# Patient Record
Sex: Female | Born: 1985 | Race: White | Hispanic: No | Marital: Married | Smoking: Never smoker
Health system: Southern US, Community
[De-identification: ages and names within clinical notes are randomized; demographics above are authoritative.]

## PROBLEM LIST (undated history)

## (undated) DIAGNOSIS — F429 Obsessive-compulsive disorder, unspecified: Secondary | ICD-10-CM

## (undated) DIAGNOSIS — F319 Bipolar disorder, unspecified: Secondary | ICD-10-CM

## (undated) DIAGNOSIS — S0300XA Dislocation of jaw, unspecified side, initial encounter: Secondary | ICD-10-CM

## (undated) DIAGNOSIS — G43909 Migraine, unspecified, not intractable, without status migrainosus: Secondary | ICD-10-CM

## (undated) DIAGNOSIS — F32A Depression, unspecified: Secondary | ICD-10-CM

## (undated) DIAGNOSIS — F329 Major depressive disorder, single episode, unspecified: Secondary | ICD-10-CM

## (undated) HISTORY — DX: Bipolar disorder, unspecified: F31.9

## (undated) HISTORY — DX: Obsessive-compulsive disorder, unspecified: F42.9

## (undated) HISTORY — DX: Dislocation of jaw, unspecified side, initial encounter: S03.00XA

## (undated) HISTORY — PX: OTHER SURGICAL HISTORY: SHX169

---

## 2007-03-21 ENCOUNTER — Emergency Department: Payer: Self-pay | Admitting: Emergency Medicine

## 2007-12-12 ENCOUNTER — Emergency Department: Payer: Self-pay | Admitting: Internal Medicine

## 2013-04-03 ENCOUNTER — Emergency Department: Payer: Self-pay | Admitting: Emergency Medicine

## 2013-04-03 LAB — URINALYSIS, COMPLETE
Bilirubin,UR: NEGATIVE
Glucose,UR: NEGATIVE mg/dL (ref 0–75)
Nitrite: NEGATIVE
Ph: 5 (ref 4.5–8.0)
Protein: NEGATIVE
RBC,UR: 2 /HPF (ref 0–5)
Squamous Epithelial: 1
WBC UR: 4 /HPF (ref 0–5)

## 2013-04-03 LAB — COMPREHENSIVE METABOLIC PANEL
Alkaline Phosphatase: 75 U/L (ref 50–136)
BUN: 9 mg/dL (ref 7–18)
Bilirubin,Total: 0.4 mg/dL (ref 0.2–1.0)
EGFR (African American): >60
EGFR (Non-African Amer.): >60
Osmolality: 272 (ref 275–301)
Potassium: 3.8 mmol/L (ref 3.5–5.1)
SGOT(AST): 22 U/L (ref 15–37)
SGPT (ALT): 17 U/L (ref 12–78)
Sodium: 137 mmol/L (ref 136–145)
Total Protein: 8.1 g/dL (ref 6.4–8.2)

## 2013-04-03 LAB — WET PREP, GENITAL

## 2013-04-03 LAB — PREGNANCY, URINE: Pregnancy Test, Urine: NEGATIVE m[IU]/mL

## 2013-04-03 LAB — CBC
MCH: 27.9 pg (ref 26.0–34.0)
MCHC: 34.4 g/dL (ref 32.0–36.0)
MCV: 81 fL (ref 80–100)
RBC: 4.93 10*6/uL (ref 3.80–5.20)

## 2013-04-03 LAB — GC/CHLAMYDIA PROBE AMP

## 2013-08-06 ENCOUNTER — Emergency Department: Payer: Self-pay | Admitting: Emergency Medicine

## 2013-08-06 LAB — URINALYSIS, COMPLETE
Bilirubin,UR: NEGATIVE
Blood: NEGATIVE
Glucose,UR: NEGATIVE mg/dL (ref 0–75)
Leukocyte Esterase: NEGATIVE
Nitrite: NEGATIVE
RBC,UR: 9 /HPF (ref 0–5)
Squamous Epithelial: 1
WBC UR: 1 /HPF (ref 0–5)

## 2013-08-06 LAB — TROPONIN I: Troponin-I: <0.02 ng/mL

## 2013-08-06 LAB — BASIC METABOLIC PANEL
Anion Gap: 6 — ABNORMAL LOW (ref 7–16)
BUN: 7 mg/dL (ref 7–18)
Calcium, Total: 8.5 mg/dL (ref 8.5–10.1)
Co2: 25 mmol/L (ref 21–32)
Creatinine: 0.72 mg/dL (ref 0.60–1.30)
Osmolality: 275 (ref 275–301)
Sodium: 139 mmol/L (ref 136–145)

## 2013-08-06 LAB — CBC
HCT: 37.6 % (ref 35.0–47.0)
MCH: 27.4 pg (ref 26.0–34.0)
MCHC: 33.7 g/dL (ref 32.0–36.0)
MCV: 81 fL (ref 80–100)
Platelet: 401 10*3/uL (ref 150–440)
RBC: 4.63 10*6/uL (ref 3.80–5.20)
WBC: 8.9 10*3/uL (ref 3.6–11.0)

## 2013-08-09 ENCOUNTER — Emergency Department (HOSPITAL_COMMUNITY): Payer: Self-pay

## 2013-08-09 ENCOUNTER — Emergency Department (HOSPITAL_COMMUNITY)
Admission: EM | Admit: 2013-08-09 | Discharge: 2013-08-09 | Disposition: A | Discharge status: Home / Self Care | Payer: Self-pay | Attending: Emergency Medicine | Admitting: Emergency Medicine

## 2013-08-09 ENCOUNTER — Encounter (HOSPITAL_COMMUNITY): Payer: Self-pay | Admitting: Emergency Medicine

## 2013-08-09 DIAGNOSIS — Z79899 Other long term (current) drug therapy: Secondary | ICD-10-CM | POA: Insufficient documentation

## 2013-08-09 DIAGNOSIS — F329 Major depressive disorder, single episode, unspecified: Secondary | ICD-10-CM | POA: Insufficient documentation

## 2013-08-09 DIAGNOSIS — F3289 Other specified depressive episodes: Secondary | ICD-10-CM | POA: Insufficient documentation

## 2013-08-09 DIAGNOSIS — R110 Nausea: Secondary | ICD-10-CM | POA: Insufficient documentation

## 2013-08-09 DIAGNOSIS — Z8679 Personal history of other diseases of the circulatory system: Secondary | ICD-10-CM | POA: Insufficient documentation

## 2013-08-09 DIAGNOSIS — R42 Dizziness and giddiness: Secondary | ICD-10-CM | POA: Insufficient documentation

## 2013-08-09 DIAGNOSIS — R55 Syncope and collapse: Secondary | ICD-10-CM | POA: Insufficient documentation

## 2013-08-09 DIAGNOSIS — Z880 Allergy status to penicillin: Secondary | ICD-10-CM | POA: Insufficient documentation

## 2013-08-09 DIAGNOSIS — R51 Headache: Secondary | ICD-10-CM | POA: Insufficient documentation

## 2013-08-09 HISTORY — DX: Migraine, unspecified, not intractable, without status migrainosus: G43.909

## 2013-08-09 HISTORY — DX: Major depressive disorder, single episode, unspecified: F32.9

## 2013-08-09 HISTORY — DX: Depression, unspecified: F32.A

## 2013-08-09 LAB — CBC WITH DIFFERENTIAL/PLATELET
Eosinophils Absolute: 0.1 10*3/uL (ref 0.0–0.7)
Eosinophils Relative: 1 % (ref 0–5)
HCT: 40.9 % (ref 36.0–46.0)
Hemoglobin: 14 g/dL (ref 12.0–15.0)
Lymphocytes Relative: 27 % (ref 12–46)
Lymphs Abs: 2.7 10*3/uL (ref 0.7–4.0)
MCH: 28.4 pg (ref 26.0–34.0)
MCV: 83 fL (ref 78.0–100.0)
Monocytes Absolute: 0.5 10*3/uL (ref 0.1–1.0)
Monocytes Relative: 5 % (ref 3–12)
Neutro Abs: 6.7 10*3/uL (ref 1.7–7.7)
Neutrophils Relative %: 67 % (ref 43–77)
RBC: 4.93 MIL/uL (ref 3.87–5.11)
RDW: 13 % (ref 11.5–15.5)

## 2013-08-09 LAB — BASIC METABOLIC PANEL
BUN: 7 mg/dL (ref 6–23)
CO2: 22 mEq/L (ref 19–32)
Calcium: 9.2 mg/dL (ref 8.4–10.5)
GFR calc non Af Amer: >90 mL/min (ref >90)
Glucose, Bld: 85 mg/dL (ref 70–99)
Potassium: 4.2 mEq/L (ref 3.5–5.1)

## 2013-08-09 MED ORDER — SODIUM CHLORIDE 0.9 % IV BOLUS (SEPSIS)
1000.0000 mL | Freq: Once | INTRAVENOUS | Status: AC
  Administered 2013-08-09: 1000 mL via INTRAVENOUS

## 2013-08-09 MED ORDER — DIPHENHYDRAMINE HCL 50 MG/ML IJ SOLN
25.0000 mg | Freq: Once | INTRAMUSCULAR | Status: AC
  Administered 2013-08-09: 25 mg via INTRAVENOUS
  Filled 2013-08-09: qty 1

## 2013-08-09 MED ORDER — METOCLOPRAMIDE HCL 5 MG/ML IJ SOLN
10.0000 mg | Freq: Once | INTRAMUSCULAR | Status: AC
  Administered 2013-08-09: 10 mg via INTRAVENOUS
  Filled 2013-08-09: qty 2

## 2013-08-09 NOTE — ED Notes (Signed)
Pt states she's currently feeling really dizzy, and when sometimes she feels like she has stuttering, vision changes "I just cant see right". "its hard for me to talk sometimes". "its hard for me to get my words out".

## 2013-08-09 NOTE — ED Notes (Signed)
Pt refusing POCT pregnancy test

## 2013-08-09 NOTE — ED Notes (Signed)
Patient transported to X-ray 

## 2013-08-09 NOTE — ED Notes (Signed)
Pt has dermal piercing in right wrist, spoke with MRI on the phone about possible contraindication, MRI will test piercing with magnet before procedure.

## 2013-08-09 NOTE — ED Provider Notes (Signed)
CSN: [id number]     Arrival date & time 08/09/13  0756 History   First MD Initiated Contact with Patient 08/09/13 0805     Chief Complaint  Patient presents with  . Near Syncope   (Consider location/radiation/quality/duration/timing/severity/associated sxs/prior Treatment) HPI Comments: 27 year old female presents with near-syncope. She states this happened multiple times over the past 3 days. She will get lightheaded but can last up to an hour. The symptoms usually get better with rest. I'll and are exacerbated by sitting or standing up. She's passed out once or twice a muscle time since a not passing out. She does get nauseous and feels like she is seeing spots. She's also been intermittently getting worsening headaches during this time. States had a headache on the left side posteriorly going to her front has been on for about 2 weeks. It waxes and wanes in intensity but has been there constantly. Headaches are not correlating with the dizzy spells. She's not having trouble walking. She threw up once after the most recent episode of lightheadedness. Denies any chest pain or shortness of breath. Has never had a blood clots. Was seen at [place] a couple days ago and states he ran some blood tests and then discharged her. She denies any other vomiting, diarrhea, urinary symptoms, or abdominal pain. She has self diagnosed herself with migraines and states she currently has a headache like that is about a 5/10 intensity.   Past Medical History  Diagnosis Date  . Migraine   . Depression    History reviewed. No pertinent past surgical history. No family history on file. History  Substance Use Topics  . Smoking status: Never Smoker   . Smokeless tobacco: Not on file  . Alcohol Use: Yes   OB History   Grav Para Term Preterm Abortions TAB SAB Ect Mult Living                 Review of Systems  Constitutional: Negative for fever and chills.  Eyes: Negative for visual disturbance.   Respiratory: Negative for shortness of breath.   Cardiovascular: Negative for chest pain and leg swelling.  Gastrointestinal: Negative for nausea, abdominal pain and diarrhea.  Genitourinary: Negative for dysuria and vaginal bleeding.  Neurological: Positive for light-headedness and headaches. Negative for weakness and numbness.  All other systems reviewed and are negative.    Allergies  Amoxicillin and Penicillins  Home Medications   Current Outpatient Rx  Name  Route  Sig  Dispense  Refill  . escitalopram (LEXAPRO) 10 MG tablet   Oral   Take 10 mg by mouth at bedtime.         Marland Kitchen ibuprofen (ADVIL,MOTRIN) 200 MG tablet   Oral   Take 600 mg by mouth every 6 (six) hours as needed for pain.         Kathrynn Running Estrad-Fe Biphas (LO LOESTRIN FE PO)   Oral   Take 1 tablet by mouth daily.          BP 122/71  Pulse 79  Temp(Src) 98.4 F (36.9 C) (Oral)  Resp 16  SpO2 93% Physical Exam  Nursing note and vitals reviewed. Constitutional: She is oriented to person, place, and time. She appears well-developed and well-nourished.  Well appearing  HENT:  Head: Normocephalic and atraumatic.  Right Ear: External ear normal.  Left Ear: External ear normal.  Nose: Nose normal.  Eyes: EOM are normal. Pupils are equal, round, and reactive to light. Right eye exhibits no discharge. Left eye  exhibits no discharge.  Cardiovascular: Normal rate, regular rhythm and normal heart sounds.   No murmur heard. Pulmonary/Chest: Effort normal and breath sounds normal. She has no wheezes. She has no rales.  Abdominal: Soft. She exhibits no distension. There is no tenderness.  Neurological: She is alert and oriented to person, place, and time. She has normal strength. No cranial nerve deficit or sensory deficit. Coordination and gait normal. GCS eye subscore is 4. GCS verbal subscore is 5. GCS motor subscore is 6.  Skin: Skin is warm and dry.    ED Course  Procedures (including critical  care time) Labs Review Labs Reviewed  CBC WITH DIFFERENTIAL - Abnormal; Notable for the following:    Platelets 406 (*)    All other components within normal limits  BASIC METABOLIC PANEL  D-DIMER, QUANTITATIVE   Imaging Review Dg Chest 2 View  08/09/2013   CLINICAL DATA:  Syncope  EXAM: CHEST  2 VIEW  COMPARISON:  None.  FINDINGS: The lungs are clear without focal consolidation, edema, effusion or pneumothorax. Cardio pericardial silhouette is within normal limits for size. Imaged bony structures of the thorax are intact.  IMPRESSION: Normal exam.   Electronically Signed   By: Kennith Center M.D.   On: 08/09/2013 09:19   Ct Head Wo Contrast  08/09/2013   CLINICAL DATA:  Headache with blurred vision  EXAM: CT HEAD WITHOUT CONTRAST  TECHNIQUE: Contiguous axial images were obtained from the base of the skull through the vertex without intravenous contrast. Study was obtained within 24 hr of patient's arrival at the emergency department.  COMPARISON:  None.  FINDINGS: The ventricles are normal in size and configuration. There is no mass, hemorrhage, extra-axial fluid collection, or midline shift.  There is a subtle area of decreased attenuation in the medial superior right occipital lobes. This finding potentially could represent a small recent infarct in the superior medial right occipital lobe. Elsewhere gray-white compartments appear normal.  Bony calvarium appears intact. The mastoid air cells are clear.  IMPRESSION: Question recent small infarct in the superior medial right occipital lobe, best seen on axial slice 16. The study otherwise unremarkable. No other areas of abnormal gray-white compartment attenuation identified. No hemorrhage seen.   Electronically Signed   By: Bretta Bang M.D.   On: 08/09/2013 09:32   Mr Brain Wo Contrast  08/09/2013   CLINICAL DATA:  Near syncope. Abnormal CT.  EXAM: MRI HEAD WITHOUT CONTRAST  TECHNIQUE: Multiplanar, multisequence MR imaging was performed. No  intravenous contrast was administered.  COMPARISON:  CT head 08/09/2013  FINDINGS: CT finding of a small hypodensity in the right occipital parietal lobe is not visualized on MRI. This may have been is artifact on CT versus a prominent sulcus.  Negative for acute or chronic infarct. Cerebral white matter is normal. Negative for demyelinating disease. Brainstem and cerebellum are normal.  Ventricle size is normal. Negative for hemorrhage or mass.  Negative for Chiari malformation. Pituitary is normal.  Artifact from earrings noted.  IMPRESSION: Normal exam.   Electronically Signed   By: Marlan Palau M.D.   On: 08/09/2013 13:00    EKG Interpretation     Ventricular Rate:  73 PR Interval:  151 QRS Duration: 94 QT Interval:  385 QTC Calculation: 424 R Axis:   -22 Text Interpretation:  Sinus rhythm Borderline left axis deviation Low voltage, precordial leads No acute ischemia No old tracing to compare            MDM  1. Syncope   2. Headache    Headache resolved with HA cocktail. Is gradual in onset and waxes and wanes. I feel an occult SAH is unlikely. Due to the abnormality on CT neuro was consulted, recommended MRI. MRI neg, feel an acute neurologic pathology is unlikely. Her near-syncope sx are mostly with standing. No hypotension here. Given fluids with some symptomatic relief. Low risk for ACS, PE or other cardiac etiology. Will recommend outpatient PCP f/u and further testing if symptoms continue.    Audree Camel, MD 08/09/13 [phone]

## 2013-08-09 NOTE — Consult Note (Signed)
Neurology Consultation Reason for Consult: passing out Referring Physician: ED, dr. Criss Alvine  CC: syncope  History is obtained from: Patient  HPI: Jill [name] is a 27 y.o. female with PMH of migraine headache and depression, who presents with syncope and headache.  Patient reports that she has migraine headache for many years. She is currently taking Excedrin for migraine headache. In the past several weeks, her headache has been worsening. It happens more often and is more intense in severity. The headache is located in the occipital area, dull, 4/10 in severity, radiating to neck. It is aggravated by standing up or light.   On 08/06/13,  she passed out while she was taking shower at about 6:00 AM. She lost consciousness for about 1 min. After she woke up, she vomited 3 times without blood in the vomitus. She felt confused and tired after the episode. The episode was associated with mild bilateral arm weakness, mild shortness of breath and bilateral pleural effusion. She reports that she had palpitation before the episode. No incontinence or seizure activities.  She did not bite her tongue. She was seen in ER at [facility], where she had a normal EKG. She was discharged home without further treatment.   She states that she felt dizzy during the whole weekend. In this morning, when she was walking her dog outside, and she started feeling dizzy and having blurry vision in both eyes. She went home, then she had another episode of passing out, which lasted for about 2 min. after the episodes, she vomited 3 times.    ROS:  Denies fever, chills, cough, chest pain, abdominal pain, diarrhea, constipation, dysuria, urgency, frequency, hematuria, joint pain or leg swelling. No pain over the calf areas. No recent sick contact.    ROS: A 14 point ROS was performed and is negative except as noted in the HPI.  Past Medical History  Diagnosis Date  . Migraine   . Depression     Family History:   Grandmother has a migraine headache.   Social History: Single, lives with her sister, no kid, denies smoking or using drugs, social alcohol drinking, no recent long distance traveling.     Exam: Current vital signs: BP 116/78  Pulse 80  Temp(Src) 98.7 F (37.1 C) (Oral)  Resp 17  SpO2 95%  LMP 07/12/2013 Vital signs in last 24 hours: Temp:  [98.4 F (36.9 C)-98.7 F (37.1 C)] 98.7 F (37.1 C) (11/03 1016) Pulse Rate:  [72-85] 80 (11/03 1330) Resp:  [16-17] 17 (11/03 1016) BP: (110-129)/(65-84) 116/78 mmHg (11/03 1330) SpO2:  [89 %-96 %] 95 % (11/03 1330)  General: Not in acute distress HEENT: PERRL, EOMI, no scleral icterus, No JVD or bruit Cardiac: S1/S2, RRR, No murmurs, gallops or rubs Pulm: Good air movement bilaterally. Clear to auscultation bilaterally. No rales, wheezing, rhonchi or rubs. Abd: Soft, nondistended, nontender, no rebound pain, no organomegaly, BS present Ext: No edema. 2+DP/PT pulse bilaterally Musculoskeletal: No joint deformities, erythema, or stiffness, ROM full Skin: No rashes.  Neuro: Alert and oriented X3, cranial nerves II-XII grossly intact, muscle strength 5/5 in all extremeties, sensation to light touch intact. Knee reflex 2+ bilaterally. Negative Babinski's sign. Normal finger to nose test. Psych: Patient is not psychotic, no suicidal or hemocidal ideation.   Assessment and plan:  #: Syncope: Etiology is not completely clear. Possible complicated migraine headache. Patient reports that she has been having migraine headache for long time, which has been worse in the past several weeks. Differential  diagnoses include, cardiac arrhythmia (Patient reports having palpitation prior to the syncope episodes.  EKG has T wave inversion in lead 3, V2 and V3, but did not show any arrhythmia). TIA/Stroke (unlikely, patient has arm weakness, but it was bilateral weakness in arms. MRI-brain is negative. No focal neurologic findings on his examination) and  panic attach (possible, patient has hx of depression and is currently on Lexapro).  -Start gabapentin 300 mg 3 times a day for one week to better control her migraine headache. -If symptoms recur, may consider cardiac arrhythmia workup   Lorretta Harp, MD PGY3, Internal Medicine Teaching Service Pager: [phone]   I have seen and evaluated the patient. I have reviewed the above note and agree with the above with the following exceptions.   A/P: I do not think that TIA is at all likely, though with the abnormal CT finding I did think that an MRI is warrented which is negative. I wonder about vasovagal syncope in the setting of pain. Her headaches have been getting worse and it may be reasonable to try a short course of gabapentin to calm down this flurry.    1) could consider gabapentin 300mg  TID x 1week 2) Aleve PRN as abortive.      PMHx: migraine  FHx: Migraine  Gen: in bed, NAD CV: RRR Mental Status: Patient is awake, alert, oriented to person, place, month, year, and situation. Immediate and remote memory are intact. Patient is able to give a clear and coherent history. No signs of aphasia or neglect Cranial Nerves: II: Visual Fields are full. Pupils are equal, round, and reactive to light.  Discs are sharp without papilledema.  III,IV, VI: EOMI without ptosis or diploplia.  V: Facial sensation is symmetric to temperature VII: Facial movement is symmetric.  VIII: hearing is intact to voice X: Uvula elevates symmetrically XI: Shoulder shrug is symmetric. XII: tongue is midline without atrophy or fasciculations.  Motor: Tone is normal. Bulk is normal. 5/5 strength was present in all four extremities.  Sensory: Sensation is symmetric to light touch and temperature in the arms and legs. Deep Tendon Reflexes: 2+ and symmetric in the biceps and patellae.  Plantars: Toes are downgoing bilaterally.  Cerebellar: FNF  intact bilaterally Gait: Patient has a stable casual  gait. Able to tandem.  MRI reviewed- no stroke  Assessment as above   Ritta Slot, MD [facility] [phone]  If 7pm- 7am, please page neurology on call at [phone].

## 2013-08-09 NOTE — ED Notes (Signed)
Pt transported to MRI 

## 2013-08-09 NOTE — ED Notes (Signed)
Neurohospitalist at bedside 

## 2013-08-09 NOTE — ED Notes (Addendum)
Passed out on Friday in the shower woke up in shower  May have hit head had a h/a  Before she passed out had n/v after on Friday. and dizzy , went to [facility] they did NOT CT head, on Friday and  Then today she got dizzy and passed out again  Has a h/a now  Has hx of migraine

## 2015-01-31 LAB — COMPREHENSIVE METABOLIC PANEL
ALBUMIN: 3.8 g/dL
ANION GAP: 6 — AB (ref 7–16)
Alkaline Phosphatase: 73 U/L
BILIRUBIN TOTAL: 0.7 mg/dL
BUN: 9 mg/dL
CREATININE: 0.63 mg/dL
Calcium, Total: 8.8 mg/dL — ABNORMAL LOW
Chloride: 107 mmol/L
Co2: 26 mmol/L
EGFR (African American): >60
EGFR (Non-African Amer.): >60
Glucose: 86 mg/dL
Potassium: 3.7 mmol/L
SGOT(AST): 24 U/L
SGPT (ALT): 19 U/L
Sodium: 139 mmol/L
TOTAL PROTEIN: 7.6 g/dL

## 2015-01-31 LAB — ETHANOL: Ethanol: <5 mg/dL

## 2015-01-31 LAB — DRUG SCREEN, URINE
Amphetamines, Ur Screen: NEGATIVE
BENZODIAZEPINE, UR SCRN: POSITIVE
Barbiturates, Ur Screen: NEGATIVE
Cannabinoid 50 Ng, Ur ~~LOC~~: NEGATIVE
Cocaine Metabolite,Ur ~~LOC~~: NEGATIVE
MDMA (Ecstasy)Ur Screen: NEGATIVE
Methadone, Ur Screen: NEGATIVE
Opiate, Ur Screen: NEGATIVE
Phencyclidine (PCP) Ur S: NEGATIVE
Tricyclic, Ur Screen: NEGATIVE

## 2015-01-31 LAB — URINALYSIS, COMPLETE
BLOOD: NEGATIVE
Bilirubin,UR: NEGATIVE
Glucose,UR: NEGATIVE mg/dL (ref 0–75)
Ketone: NEGATIVE
NITRITE: NEGATIVE
PH: 6 (ref 4.5–8.0)
Protein: NEGATIVE
SPECIFIC GRAVITY: 1.013 (ref 1.003–1.030)

## 2015-01-31 LAB — CBC
HCT: 41.3 % (ref 35.0–47.0)
HGB: 13.7 g/dL (ref 12.0–16.0)
MCH: 28.3 pg (ref 26.0–34.0)
MCHC: 33.3 g/dL (ref 32.0–36.0)
MCV: 85 fL (ref 80–100)
PLATELETS: 354 10*3/uL (ref 150–440)
RBC: 4.86 10*6/uL (ref 3.80–5.20)
RDW: 13.4 % (ref 11.5–14.5)
WBC: 8.4 10*3/uL (ref 3.6–11.0)

## 2015-01-31 LAB — TSH: THYROID STIMULATING HORM: 1.86 u[IU]/mL

## 2015-01-31 LAB — SALICYLATE LEVEL: Salicylates, Serum: <4 mg/dL

## 2015-01-31 LAB — ACETAMINOPHEN LEVEL: Acetaminophen: <10 ug/mL

## 2015-02-02 ENCOUNTER — Inpatient Hospital Stay
Admission: AD | Admit: 2015-02-02 | Discharge: 2015-02-06 | DRG: 885 | Disposition: A | Discharge status: Home / Self Care | Payer: Self-pay | Attending: Psychiatry | Admitting: Psychiatry

## 2015-02-02 DIAGNOSIS — Z716 Tobacco abuse counseling: Secondary | ICD-10-CM | POA: Diagnosis present

## 2015-02-02 DIAGNOSIS — X789XXA Intentional self-harm by unspecified sharp object, initial encounter: Secondary | ICD-10-CM | POA: Diagnosis present

## 2015-02-02 DIAGNOSIS — F332 Major depressive disorder, recurrent severe without psychotic features: Secondary | ICD-10-CM | POA: Insufficient documentation

## 2015-02-02 DIAGNOSIS — T424X2A Poisoning by benzodiazepines, intentional self-harm, initial encounter: Secondary | ICD-10-CM | POA: Diagnosis present

## 2015-02-02 DIAGNOSIS — F5105 Insomnia due to other mental disorder: Secondary | ICD-10-CM | POA: Diagnosis present

## 2015-02-02 DIAGNOSIS — Z818 Family history of other mental and behavioral disorders: Secondary | ICD-10-CM

## 2015-02-02 DIAGNOSIS — S60812A Abrasion of left wrist, initial encounter: Secondary | ICD-10-CM | POA: Diagnosis present

## 2015-02-02 DIAGNOSIS — F17210 Nicotine dependence, cigarettes, uncomplicated: Secondary | ICD-10-CM | POA: Diagnosis present

## 2015-02-02 DIAGNOSIS — F429 Obsessive-compulsive disorder, unspecified: Secondary | ICD-10-CM | POA: Diagnosis present

## 2015-02-02 DIAGNOSIS — F3181 Bipolar II disorder: Principal | ICD-10-CM | POA: Diagnosis present

## 2015-02-02 DIAGNOSIS — R45851 Suicidal ideations: Secondary | ICD-10-CM | POA: Diagnosis present

## 2015-02-02 DIAGNOSIS — Z915 Personal history of self-harm: Secondary | ICD-10-CM

## 2015-02-02 DIAGNOSIS — F42 Obsessive-compulsive disorder: Secondary | ICD-10-CM | POA: Diagnosis present

## 2015-02-04 DIAGNOSIS — F3181 Bipolar II disorder: Secondary | ICD-10-CM | POA: Diagnosis present

## 2015-02-04 MED ORDER — NICOTINE 10 MG IN INHA: 1.0000 | RESPIRATORY_TRACT | Status: DC | PRN

## 2015-02-04 MED ORDER — FLUVOXAMINE MALEATE 50 MG PO TABS
100.0000 mg | ORAL_TABLET | Freq: Every day | ORAL | Status: DC
  Administered 2015-02-05: 100 mg via ORAL
  Filled 2015-02-04: qty 2

## 2015-02-04 MED ORDER — DIPHENHYDRAMINE HCL 25 MG PO CAPS: 25.0000 mg | ORAL_CAPSULE | Freq: Every evening | ORAL | Status: DC | PRN

## 2015-02-04 MED ORDER — IBUPROFEN 600 MG PO TABS
600.0000 mg | ORAL_TABLET | Freq: Four times a day (QID) | ORAL | Status: DC | PRN
  Administered 2015-02-05: 600 mg via ORAL
  Filled 2015-02-04: qty 1

## 2015-02-04 MED ORDER — MAGNESIUM HYDROXIDE 400 MG/5ML PO SUSP: 30.0000 mL | Freq: Every evening | ORAL | Status: DC | PRN

## 2015-02-04 MED ORDER — DIPHENHYDRAMINE HCL 25 MG PO CAPS: 25.0000 mg | ORAL_CAPSULE | ORAL | Status: DC | PRN

## 2015-02-04 MED ORDER — TRAZODONE HCL 100 MG PO TABS
100.0000 mg | ORAL_TABLET | Freq: Every day | ORAL | Status: DC
  Administered 2015-02-05: 100 mg via ORAL
  Filled 2015-02-04: qty 1

## 2015-02-04 MED ORDER — ACETAMINOPHEN 325 MG PO TABS: 650.0000 mg | ORAL_TABLET | ORAL | Status: DC | PRN

## 2015-02-04 MED ORDER — LAMOTRIGINE 25 MG PO TABS
25.0000 mg | ORAL_TABLET | Freq: Every day | ORAL | Status: DC
  Administered 2015-02-05: 25 mg via ORAL
  Filled 2015-02-04: qty 1

## 2015-02-05 ENCOUNTER — Encounter: Payer: Self-pay | Admitting: Psychiatry

## 2015-02-05 DIAGNOSIS — F429 Obsessive-compulsive disorder, unspecified: Secondary | ICD-10-CM | POA: Diagnosis present

## 2015-02-05 DIAGNOSIS — F5105 Insomnia due to other mental disorder: Secondary | ICD-10-CM | POA: Diagnosis present

## 2015-02-05 NOTE — Progress Notes (Signed)
Pt. Interacting appropriately with staff and peers.  Appetite good.  Attending group activities.

## 2015-02-05 NOTE — H&P (Signed)
PATIENT NAME:  Jill [name], Jill [name] MR#:  [id number] DATE OF BIRTH:  May 09, 1986  DATE OF ADMISSION:  02/02/2015  REFERRING PHYSICIAN: Emergency Room MD.   ATTENDING PHYSICIAN: Kristine Linea, MD.   IDENTIFYING DATA: Miss Jill [name] is a 29 year old female with history of bipolar disorder and anxiety.   CHIEF COMPLAINT: I was overwhelmed.   HISTORY OF PRESENT ILLNESS:  Miss Jill [name] has bipolar illness, but she has not seen a psychiatrist or taken any medication in over 2 years. She has been managing her symptoms effectively with coping skills. She got married on April 16 and in preparation to the wedding she became increasingly anxious with symptoms of OCD returning. She counts, everything has to have everything in sets of 2s or even number. She has checking behaviors and excessive worries.  This has been escalating out of proportion, making it impossible for her get through the day and planning and executing the wedding was even worse. She found little support in her husband, they have been together for 10 years so he is well familiar with her symptoms, but somehow he felt she was doing so well that the illness was over. She was concerned about gaining some weight and maybe her husband not feeling as romantic as she would expect. She overdosed on Xanax that she stole from her sister, a handful of it. She was brought to the Emergency Room for treatment. She reports many symptoms of depression with poor sleep, decreased appetite, anhedonia, feeling of guilt, hopelessness, worthlessness, poor energy and concentration, crying spells, social isolation, and thoughts of hurting herself. She really overdosed impulsively. She also noticed worsening of her anxiety. She denies symptoms suggestive of bipolar mania. There are no psychotic symptoms. She does not use alcohol or illicit drugs. She does not use benzodiazepines other than overdose.   PAST PSYCHIATRIC HISTORY: She has never been hospitalized and there were  no suicide attempts. She used to cut when she was a teenager, but has not done it in a long time. On the day of admission however she did scratch her arm longitudinally in addition to overdosing on Xanax. She does not remember medications that were given in the past except for Lexapro. She used to have a therapist.   FAMILY PSYCHIATRIC HISTORY: Positive for schizophrenia in great grandmother, bipolar in grandmother and bipolar in her sister. She does not know which medications her sister takes, but reportedly she is doing well.   PAST MEDICAL HISTORY: None.   ALLERGIES:   AMOXICILLIN.   MEDICATIONS ON ADMISSION: None.   SOCIAL HISTORY: She works as a Research scientist (medical) at [employer], just started this job a month or so ago, still waiting for [employer] She got married to a man whom she has known for 10 years. It turned out to be a very stressful event.   REVIEW OF SYSTEMS:  CONSTITUTIONAL: No fevers or chills. No weight changes.  EYES: No double or blurred vision.  EARS, NOSE, AND THROAT: No hearing loss.  RESPIRATORY: No shortness of breath or cough.  CARDIOVASCULAR: No chest pain or orthopnea.  GASTROINTESTINAL: No abdominal pain, nausea, vomiting, or diarrhea, although she did have some upset stomach after taking the first dose of Celexa in the Emergency Room.  GENITOURINARY: No incontinence or frequency.  ENDOCRINE: No heat or cold intolerance.  LYMPHATIC: No anemia or easy bruising.  INTEGUMENTARY: No acne or rash.  MUSCULOSKELETAL: No muscle or joint pain.  NEUROLOGIC: No tingling or weakness.  PSYCHIATRIC: See history of present illness for  details.   PHYSICAL EXAMINATION:   VITAL SIGNS:  Blood pressure 124/75, pulse 77, respirations 20, temperature 97.8.  GENERAL: This is a well-developed, slightly obese young female in no acute distress.  HEENT: The pupils are equal, round, and reactive to light. Sclerae anicteric.  NECK: Supple. No thyromegaly.  LUNGS: Clear to auscultation.  No dullness to percussion.  HEART: Regular rhythm and rate. No murmurs, rubs, or gallops.  ABDOMEN: Soft, nontender, nondistended. Positive bowel sounds.  MUSCULOSKELETAL: Normal muscle strength in all extremities.  SKIN: No rashes or bruises.  LYMPHATIC: No cervical adenopathy.  NEUROLOGIC: Cranial nerves II through XII are intact.   LABORATORY DATA: Chemistries are within normal limits. Blood alcohol level is 0. LFTs within normal limits. TSH 1.86. Urine toxicology screen positive for benzodiazepines. CBC within normal limits. Urinalysis is not suggestive of urinary tract infection. Serum acetaminophen and salicylates are low. Urine pregnancy test is negative.   MENTAL STATUS EXAMINATION ON ADMISSION: The patient is alert and oriented to person, place, time, and situation. She is pleasant, polite and cooperative. She is well groomed, wearing hospital scrubs. She maintains good eye contact. Her speech is soft. Mood is depressed with crying affect. Thought process is logical and goal oriented. She denies thoughts of hurting herself now and is glad to be alive, but was admitted after an overdose on Xanax with suicide intent. There are no homicidal ideation. There are no delusions or paranoia. There are no auditory or visual hallucinations. Her cognition is grossly intact. Registration, recall, short and long-term memory are intact. She is of average intelligence and fund of knowledge. Her insight and judgment are limited.   SUICIDE RISK ASSESSMENT ON ADMISSION:  This is a patient with a long history of depression, mood instability, and severe anxiety who was brought to the hospital after intentional overdose on medication.   INITIAL DIAGNOSES:  AXIS I:  Bipolar II disorder, obsessive compulsive disorder.  PLAN: The patient was admitted to [facility] unit. 1.  Suicidal ideation: The patient is able to contract for safety on the unit.  2.  Mood.  We will  start Lamictal 25 mg at bedtime for mood stabilization.  The patient is worried about weight gain.  3.  Anxiety. We will try Luvox tonight to see if the patient can tolerate it.   DISPOSITION: She will be discharged to home with her husband. She will [facility] up [facility] at [facility]. She has no insurance at present, but will have [employer] through her employer shortly.      ____________________________ Ellin GoodieJolanta B. Jennet MaduroPucilowska, MD jbp:[id number] D: 02/02/2015 14:26:35 ET T: 02/02/2015 14:50:04 ET JOB#: [id number]  cc: Zaydin Billey B. Jennet MaduroPucilowska, MD, <Dictator> Shari ProwsJOLANTA B Christion Leonhard MD ELECTRONICALLY SIGNED 02/02/2015 21:58

## 2015-02-05 NOTE — Progress Notes (Addendum)
[facility] MD Progress Note  02/05/2015 11:23 AM Jill [name]  MRN:  [id number] Subjective:  [name] is extremely anxious today. She does not feel ready to discharge. She is worried about her interaction with her husband. He seems not to understand her problems, he feels that this is h that  is fault that she ended up in the hospital. she was offered a family meeting meeting with her husband. That is uncertain if she wants to do. They met several times during current hospitalization. And the patient does note easy tolking to her husband in the hospital. She feels that her husband will need a lot of education about bipolar. She brought up issues of intimacy. They've been together for 10 years. They have not been intimate for several years now. When manic she has increased sex drive and her husband has not been responsive. They got married a few day curfew weeks ago and they would not intimate on that honeymoon this is a problem for the patient. She also worries about her interaction with peers and her family following discharge. She does not know how to talk about hospitalizations or her problems. She has a sister with bipolar illness has been supportive. She also plans to start psychotherapy that was helpful in the past. She reports that overall her mood is better. She also is not as preoccupied with obsessions and compulsions. Her sleep has improved with trazodone. She seems to tolerate medications well.    Principal Problem:  Major depression with suicidal ideation.   Diagnosis:   Patient Active Problem List   Diagnosis Date Noted  . OCD (obsessive compulsive disorder) [F42] 02/05/2015  . 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 Tobacco use disorder [Z72.0] 02/05/2015  . Major depression [F32.2] 02/04/2015   Total Time spent with patient: 30 minutes   Past Medical History:  Past Medical History  Diagnosis Date  . Migraine   . Depression     Past Surgical History   Procedure Laterality Date  . None     Family History: No family history on file. Social History:  History  Alcohol Use No     History  Drug Use No    History   Social History  . Marital Status: Single    Spouse Name: N/A  . Number of Children: N/A  . Years of Education: N/A   Social History Main Topics  . Smoking status: Current Every Day Smoker -- 1.00 packs/day    Types: Cigarettes  . Smokeless tobacco: Not on file  . Alcohol Use: No  . Drug Use: No  . Sexual Activity:    Partners: Male    Birth Control/ Protection: OCP   Other Topics Concern  . None   Social History Narrative   She is originally from [place] and graduated high school in [place]. She has been married for approximately 2 weeks and has no children. She has been working for [employer] for the past one month. She currently lives with her husband. She denies any physical or sexual abuse.   Additional History:    Sleep: Fair  Appetite:  Good   Assessment:   Musculoskeletal: Strength & Muscle Tone: within normal limits Gait & Station: normal Patient leans: N/A   Psychiatric Specialty Exam: Physical Exam  Review of Systems  Constitutional: Negative for fever, chills, weight loss, malaise/fatigue and diaphoresis.  HENT: Negative for hearing loss.   Eyes: Negative for blurred vision and double vision.  Respiratory: Negative for  cough, hemoptysis and shortness of breath.   Cardiovascular: Negative for chest pain, palpitations and orthopnea.  Gastrointestinal: Negative for heartburn, nausea, abdominal pain and constipation.  Genitourinary: Negative for dysuria, urgency and frequency.  Musculoskeletal: Negative for myalgias, back pain, joint pain and neck pain.  Skin: Negative.  Negative for itching and rash.  Neurological: Negative for dizziness, tingling, tremors, weakness and headaches.  Endo/Heme/Allergies: Does not bruise/bleed easily.  Psychiatric/Behavioral: Positive for depression and  suicidal ideas. The patient has insomnia.     Blood pressure 133/83, pulse 93, temperature 98.5 F (36.9 C), temperature source Oral, resp. rate 18, height $RemoveBe'5\' 9"'cXALUIvtB$  (1.753 m), weight 224 lb 6.9 oz (101.8 kg).Body mass index is 33.13 kg/(m^2).  General Appearance: Casual  Eye Contact::  Good  Speech:  Clear and Coherent  Volume:  Normal  Mood:  Anxious  Affect:  Appropriate  Thought Process:  Coherent  Orientation:  Full (Time, Place, and Person)  Thought Content:  Obsessions  Suicidal Thoughts:  No  Homicidal Thoughts:  No  Memory:  Immediate;   Good  Judgement:  Good  Insight:  Good  Psychomotor Activity:  Normal  Concentration:  Good  Recall:  Good  Fund of Knowledge:Good  Language: Good  Akathisia:  No  Handed:  Right  AIMS (if indicated):     Assets:  Desire for Improvement Housing Physical Health Resilience Social Support Transportation Vocational/Educational  ADL's:  Intact  Cognition: WNL  Sleep:  Number of Hours: 7     Current Medications: Current Facility-Administered Medications  Medication Dose Route Frequency Provider Last Rate Last Dose  . acetaminophen (TYLENOL) tablet 650 mg  650 mg Oral Q4H PRN Doctor Chlconversion, MD      . diphenhydrAMINE (BENADRYL) capsule 25 mg  25 mg Oral Q4H PRN Doctor Chlconversion, MD      . fluvoxaMINE (LUVOX) tablet 100 mg  100 mg Oral QHS Doctor Chlconversion, MD      . ibuprofen (ADVIL,MOTRIN) tablet 600 mg  600 mg Oral Q6H PRN Doctor Chlconversion, MD      . lamoTRIgine (LAMICTAL) tablet 25 mg  25 mg Oral QHS Doctor Chlconversion, MD      . magnesium hydroxide (MILK OF MAGNESIA) suspension 30 mL  30 mL Oral QHS PRN Doctor Chlconversion, MD      . nicotine (NICOTROL) 10 MG inhaler 1 continuous puffing  1 continuous puffing Inhalation Q4H PRN Doctor Chlconversion, MD      . traZODone (DESYREL) tablet 100 mg  100 mg Oral QHS Doctor Chlconversion, MD        Lab Results: No results found for this or any previous visit (from  the past 48 hour(s)).  Physical Findings: AIMS:  Not applicable. CIWA:  Not applicable. COWS:  Not applicable.  Treatment Plan Summary: The Tamala Julian is a 29 year old female with a history of depression and severe OCD. She was admitted after overdose on Xanax 2 weeks after getting married.   1. Suicidal ideation. She is no longer suicidal but extremely anxious about facing peers in the community.  2. Mood. She was started on Luvox for anxiety and depression she is currently taking 100 mg at bedtime  3. Insomnia. She is taking 100 mg trazodone at bedtime.  4. Smoking. Nicotine products are available that she is not interested in smoking cessation with discussed the risks benefits and ways to quit smoking.  5. Disposition. She will be discharged to home with her husband will follow-up with primary [facility] for medication management and psychotherapy.  6. Bipolar disorder. The patient was started on Lamictal 25 mg in the evening. She is concerned about weight gain and no medication associated with weight gain acceptable to her.   Daily contact with patient to assess and evaluate symptoms and progress in treatment and Medication management   Medical Decision Making:  Established Problem, Stable/Improving (1), Review of Psycho-Social Stressors (1), Review of Last Therapy Session (1) and Review of Medication Regimen & Side Effects (2)  Srat time10:05 Stop time: 10:42.   Orson Slick 02/05/2015, 11:23 AM

## 2015-02-05 NOTE — Consult Note (Signed)
PATIENT NAME:  Jill [name], Jill [name] MR#:  [id number] DATE OF BIRTH:  October 09, 1985  DATE OF CONSULTATION:  02/01/2015  REFERRING PHYSICIAN:   CONSULTING PHYSICIAN:  Santosha Jividen K. Guss Bundehalla, MD  PLACE OF DICTATION:  [facility] [place].    AGE:  28 years.    SEX:  Female.    RACE:  White.    SUBJECTIVE: The patient was seen in consultation at [facility].  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  The patient reports that she is always employed and keeps her jobs. The patient is married for [name] few days and she has been living with her husband.  The patient reports that she took an overdose of Xanax and she does not know the number of pills and cut her left wrist.  This incident happened last night.   CHIEF COMPLAINT:  "I don't know, I guess I was upset and I did it."    HISTORY OF PRESENT ILLNESS:  The patient reports that she did not have any conflicts with her family members or with her husband, that she just did it and she just felt that she did not want to be around.    PAST PSYCHIATRIC HISTORY:  No previous history of inpatient hold on psychiatry.  No history of suicide attempts.  Not being followed by any psychiatrist.  She did have wishes of not being around for quite some time.   ALCOHOL AND DRUGS:  Denies drinking alcohol.  Denies street or prescription drug abuse  Denies smoking nicotine cigarettes.    MENTAL STATUS: The patient alert and oriented, calm, pleasant, cooperative.  No agitation.  Affect is flat  with mood depressed.  Admits she is feeling low and down.  Admits to feeling hopeless and helpless.  Admits feeling w/u about himself.  Does have ideas and wishes that she should not be around and this is for no reason and no specific stress.  No psychosis.  Does not appear to be responding to internal stimuli.  The patient is not able to contract for safety and does  have suicidal wishes, but does not have any plans at this time.    IMPRESSION:  Major depression, single episode with suicidal ideas.  Will contract for safety, why she is here.    RECOMMENDATIONS:  Inpatient hospitalization on psychiatry for [name] closer observation, evaluation and help She is started on Celexa 40 mg p.o. daily to help her with her depression.     ____________________________ Jannet MantisSurya K. Guss Bundehalla, MD skc:bu D: 02/01/2015 13:31:25 ET T: 02/01/2015 14:15:07 ET JOB#: [id number]  cc: Monika SalkSurya K. Guss Bundehalla, MD, <Dictator> Beau FannySURYA K Calley Drenning MD ELECTRONICALLY SIGNED 02/03/2015 13:26

## 2015-02-05 NOTE — Plan of Care (Signed)
Problem: Alteration in mood; excessive anxiety as evidenced by: Goal: STG-Pt can identify how behaviors/thoughts can (Patient can identify how behaviors/thoughts can increase and decrease anxiety)  Outcome: Progressing Pt. Verbalizes understanding of how negative thoughts have impact on her anxiety levels. Goal: STG-Pt will report an absence of self-harm thoughts/actions (Patient will report an absence of self-harm thoughts or actions)  Outcome: Progressing Currently denies self-harm thoughts and has not attempted to harm herself.

## 2015-02-05 NOTE — H&P (Signed)
PATIENT NAME:  Jill [name], [name] MR#:  [id number] DATE OF BIRTH:  October 18, 1985  DATE OF ADMISSION:  02/02/2015  ADDENDUM  ATTENDING PHYSICIAN:  Bubba CampYolanta B. Jennet MaduroPucilowska, M.D.   REFERRING PHYSICIAN: Emergency Room M.D.   ATTENDING PHYSICIAN: Kristine LineaJolanta Harjit Leider, M.D.   MENTAL STATUS EXAMINATION ON ADMISSION:  The patient denies thoughts of hurting herself or others, but was admitted after an intentional overdose on Xanax.  There are no delusions or paranoia. There are no auditory or visual hallucinations. Her cognition is grossly intact. Registration, recall, short and long-term memory are intact. She is of average intelligence and fund of knowledge. Her insight and judgment are limited.   SUICIDE RISK ASSESSMENT ON ADMISSION:  This is a patient with a long history of depression, mood instability, and severe anxiety who was brought to the hospital after intentional overdose on medication.   INITIAL DIAGNOSES:  AXIS I:  Bipolar II disorder, obsessive compulsive disorder.  PLAN: The patient was [facility] unit. 1.  Suicidal ideation: The patient is able to contract for safety on the unit.  2.  Mood.  We will start Lamictal 25 mg at bedtime for mood stabilization.  The patient is worried about weight gain.  3.  Anxiety. We will try Luvox tonight to see if the patient can tolerate it.   DISPOSITION: She will be discharged to home with her husband. [facility] She has no insurance at present, but will have [employer] through her employer shortly.   ____________________________ Ellin GoodieJolanta B. Jennet MaduroPucilowska, MD jbp:sp D: 02/02/2015 14:56:36 ET T: 02/02/2015 15:18:15 ET JOB#: [id number]  cc: Keoki Mchargue B. Jennet MaduroPucilowska, MD, <Dictator> Shari ProwsJOLANTA B Dalene Robards MD ELECTRONICALLY SIGNED 02/02/2015 21:58

## 2015-02-05 NOTE — Plan of Care (Signed)
Problem: Diagnosis: Increased Risk For Suicide Attempt Goal: LTG-Patient Will Show Positive Response to Medication LTG (by discharge) : Patient will show positive response to medication and will participate in the development of the discharge plan.  Outcome: Progressing Pt. Expresses she is feeling improved with lessening of depressive symptoms. Goal: STG-Patient Will Report Suicidal Feelings to Staff Outcome: Progressing Pt. Denies suicidal feelings, but states she would report to staff. Goal: STG-Patient Will Comply With Medication Regime Outcome: Progressing Taking prescribed meds.

## 2015-02-05 NOTE — Progress Notes (Signed)
Pt. Rates her depression as a 4. Denies SI/HI, A/V hallucinations.

## 2015-02-05 NOTE — [facility] Group Notes (Signed)
[facility] Group Notes:  (Nursing/MHT/Case Management/Adjunct)  Date:  02/05/2015  Time:  9:50 PM  Type of Therapy:  Group Therapy  Participation Level:  Active  Participation Quality:  Appropriate  Affect:  Appropriate  Cognitive:  Appropriate  Insight:  Appropriate  Engagement in Group:  Engaged  Modes of Intervention:  Education  Summary of Progress/Problems:  Jill MunroeClarence [name] Jill [name] 02/05/2015, 9:50 PM

## 2015-02-06 DIAGNOSIS — F332 Major depressive disorder, recurrent severe without psychotic features: Secondary | ICD-10-CM | POA: Insufficient documentation

## 2015-02-06 MED ORDER — FLUVOXAMINE MALEATE 100 MG PO TABS: 100.0000 mg | ORAL_TABLET | Freq: Every day | ORAL | Status: DC

## 2015-02-06 MED ORDER — LAMOTRIGINE 25 MG PO TABS: 25.0000 mg | ORAL_TABLET | Freq: Every day | ORAL | Status: DC

## 2015-02-06 MED ORDER — TRAZODONE HCL 100 MG PO TABS: 100.0000 mg | ORAL_TABLET | Freq: Every day | ORAL | Status: DC

## 2015-02-06 NOTE — Progress Notes (Signed)
Recreation Therapy Notes  Recreation Therapy Group Note Template    Date: [id number] Time: 3:00 PM Location: Craft Room  Group Topic: Self-Expression  Goal Area(s) Addresses:  Patient will identify one color per emotion listed on wheel. Patient will verbalize benefit of using art as a means of self-expression. Patient will verbalize one emotion experienced during session. Patient will be educated on other forms of self-expression.  Behavioral Response: Attentive, Interactive, Left early  Intervention: Emotion Wheel  Activity: Patients were given a worksheet with 7 different emotions. Patients were instructed to pick a color for each emotion and color the designated section.   Education: LRT educated patients on self-expression and different forms of self-expression.   Education Outcome: Acknowledges education/In group clarification offered  Clinical Observations/Feedback: Patient completed activity by picking colors for each emotion and drawing symbols for each emotion. Patient contributed to group discussion by stating what colors she picked for each emotion and why she picked those colors. Patient left group at approximately 3:25 pm with Dr. Demetrius CharityP. Patient returned to group at approximately 3:27 pm. Patient left group again at approximately 3:30 pm with nurse. Patient did not return to group.    Jacquelynn CreeElizabeth M Greene, LRT/CTRS 02/06/2015 4:23 PM

## 2015-02-06 NOTE — Plan of Care (Signed)
Problem: Diagnosis: Increased Risk For Suicide Attempt Goal: LTG-Patient Will Report Improved Mood and Deny Suicidal LTG (by discharge) Patient will report improved mood and deny suicidal ideation.  Outcome: Progressing Patient denies any suicidal thoughts     

## 2015-02-06 NOTE — Progress Notes (Signed)
Patient rates her depression a "2"; denies any SI and contracts for safety; admits to mild anxiety that comes and goes; has been calm and cooperative throughout the shift; only complaint is menstrual cramps; continue to monitor.

## 2015-02-06 NOTE — [facility] Group Notes (Signed)
[facility] Group Notes:  (Nursing/MHT/Case Management/Adjunct)  Date:  02/06/2015  Time:  12:31 PM  Type of Therapy:  Psychoeducational Skills  Participation Level:  Active  Participation Quality:  Appropriate, Attentive and Sharing  Affect:  Appropriate  Cognitive:  Alert and Appropriate  Insight:  Appropriate  Engagement in Group:  Engaged  Modes of Intervention:  Discussion, Education and Support  Summary of Progress/Problems:  Jill [name] [facility] 02/06/2015, 12:31 PM

## 2015-02-06 NOTE — [facility] Suicide Risk Assessment (Addendum)
Suicide Risk Assessment  Discharge Assessment   [facility]  [name] is no longer suicidal or homicidal excessively anxious or psychotic. She tolerates medications well. There are no somatic complaints. She is forward thinking and optimistic about the future. She will be discharged with her husband today.  Demographic Factors:  Adolescent or young adult and Caucasian  Total Time spent with patient: 30 minutes  Musculoskeletal: Strength & Muscle Tone: within normal limits Gait & Station: normal Patient leans: N/A  Psychiatric Specialty Exam: Review of Systems - General ROS: negative     Blood pressure 133/83, pulse 93, temperature 98.5 F (36.9 C), temperature source Oral, resp. rate 18, height 5\' 9"  (1.753 m), weight 101.8 kg (224 lb 6.9 oz).Body mass index is 33.13 kg/(m^2).  General Appearance: Casual and Well Groomed  Eye Contact::  Good  Speech:  Clear and Coherent and Normal Rate409  Volume:  Normal  Mood:  Euthymic  Affect:  Appropriate  Thought Process:  Goal Directed, Linear and Logical  Orientation:  Full (Time, Place, and Person)  Thought Content:  WDL  Suicidal Thoughts:  No  Homicidal Thoughts:  No  Memory:  Immediate;   Good Recent;   Good Remote;   Good  Judgement:  Fair  Insight:  Fair  Psychomotor Activity:  Normal  Concentration:  Good  Recall:  Good  Fund of Knowledge:Good  Language: Good  Akathisia:  No  Handed:  Right  AIMS (if indicated):     Assets:  Desire for Improvement Financial Resources/Insurance Housing Physical Health Social Support Transportation Vocational/Educational  Sleep:  Number of Hours: 7  Cognition: WNL  ADL's:  Intact      Has this patient used any form of tobacco in the last 30 days? (Cigarettes, Smokeless Tobacco, Cigars, and/or Pipes) Yes, A prescription for an FDA-approved tobacco cessation medication was offered at discharge and the patient refused  Mental Status Per Nursing  Assessment::   On Admission:     Current Mental Status by Physician: NA  Loss Factors: NA  Historical Factors: Family history of mental illness or substance abuse and Impulsivity  Risk Reduction Factors:   Sense of responsibility to family, Employed, Living with another person, especially a relative and Positive social support  Continued Clinical Symptoms:  Bipolar Disorder:   Bipolar II Obsessive-Compulsive Disorder  Cognitive Features That Contribute To Risk:  None    Suicide Risk:  Minimal: No identifiable suicidal ideation.  Patients presenting with no risk factors but with morbid ruminations; may be classified as minimal risk based on the severity of the depressive symptoms  Principal Problem: Bipolar 2 disorder, major depressive episode Discharge Diagnoses:  Patient Active Problem List   Diagnosis Date Noted  . OCD (obsessive compulsive disorder) [F42] 02/05/2015  . 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 Tobacco use disorder [Z72.0] 02/05/2015  . Bipolar 2 disorder, major depressive episode [F31.81] 02/04/2015    Follow-up Information    Follow up with [facility]. Go in 2 days.   Why:   You may walk in on Mondays Wednesdays or Fridays between 8am-3pm for , Hospital Follow up, Referral for Outpatient Medication Management and Therapy   Contact information:   [facility] [address] [place] [address] 269 [phone]; fax-[phone]      Plan Of Care/Follow-up recommendations:  Activity:  as tolerated. Diet:  regular Other:  follow up with your appointments.  Is patient on multiple antipsychotic therapies at discharge:  No   Has  Patient had three or more failed trials of antipsychotic monotherapy by history:  No  Recommended Plan for Multiple Antipsychotic Therapies: NA    Rainbow Salman 02/06/2015, 1:58 PM

## 2015-02-06 NOTE — Discharge Summary (Signed)
Physician Discharge Summary Note  Patient:  Jill [name] is an 29 y.o., female MRN:  [id number] DOB:  [birth year] Patient phone:  [phone] (home)  Patient address:   [address] [place] [address],  Total Time spent with patient: 45 minutes  Date of Admission:  02/02/2015 Date of Discharge:  02/06/2015  Reason for Admission:  Worsening depresion and anxiety with suicide attempt by overdose on medication.  Principal Problem: Bipolar 2 disorder, major depressive episode Discharge Diagnoses: Patient Active Problem List   Diagnosis Date Noted  . OCD (obsessive compulsive disorder) [F42] 02/05/2015  . 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 Tobacco use disorder [Z72.0] 02/05/2015  . Bipolar 2 disorder, major depressive episode [F31.81] 02/04/2015   Physical Exam  ROS  Blood pressure 133/83, pulse 93, temperature 98.5 F (36.9 C), temperature source Oral, resp. rate 18, height 5\' 9"  (1.753 m), weight 101.8 kg (224 lb 6.9 oz).Body mass index is 33.13 kg/(m^2).                                                    Sleep:  Number of Hours: 7   Past Medical History:  Past Medical History  Diagnosis Date  . Migraine   . Depression     Past Surgical History  Procedure Laterality Date  . None     Family History: No family history on file. Social History:  History  Alcohol Use No     History  Drug Use No    History   Social History  . Marital Status: Single    Spouse Name: N/A  . Number of Children: N/A  . Years of Education: N/A   Social History Main Topics  . Smoking status: Current Every Day Smoker -- 1.00 packs/day    Types: Cigarettes  . Smokeless tobacco: Not on file  . Alcohol Use: No  . Drug Use: No  . Sexual Activity:    Partners: Male    Birth Control/ Protection: OCP   Other Topics Concern  . None   Social History Narrative   [place] from [place]. She has been married for approximately 2 weeks and has no children. She has been working for [employer] for the past one month. She currently lives with her husband. She denies any physical or sexual abuse.    Past Psychiatric History: Hospitalizations: Never hospitalized  Outpatient Care: Not seen by psychiatrist for several years.  Substance Abuse Care: None.  Self-Mutilation: History of cutting.  Suicidal Attempts: None.  Violent Behaviors: None.   Level of Care:  OP  Hospital Course:    Jill [name] is a 29 year old female with a history of bipolar disorder and OCD. She was admitted after a suicide attempt by Xanax overdose in the context of severe social stressors. She was [facility] to [facility] for safety stabilization and medication management.  1. Suicidal ideation. This has resolved. The patient is able to contract for safety.  2. Mood. She was started on Lamictal 25 mg in the evening. Her dose will be titrated in the community to the final dose of 200 mg at bedtime for mood stabilization.  3. Anxiety. The patient has severe OCD that worsen with worsening of her depression. She was started on Lexapro  initially. She could not tolerate Lexapro. She was switched to Luvox  and was able to tolerate 100 mg at bedtime without side effects.  4. Insomnia. The patient responded well to trazodone.  5. Disposition. She was discharged to home with her husband. She will follow up with [facility] for medication management and psychotherapy.  Consults:  None  Significant Diagnostic Studies:  None  Discharge Vitals:   Blood pressure 133/83, pulse 93, temperature 98.5 F (36.9 C), temperature source Oral, resp. rate 18, height  (1.753 m), weight 101.8 kg (224 lb 6.9 oz). Body mass index is 33.13 kg/(m^2). Lab Results:   No results found for this or any previous visit (from the past 72 hour(s)).   See Psychiatric Specialty Exam and Suicide Risk Assessment  completed by Attending Physician prior to discharge.  Discharge destination:  Home   Discharge Instructions    Diet - low sodium heart healthy    Complete by:  As directed      Increase activity slowly    Complete by:  As directed             Medication List    STOP taking these medications        escitalopram 10 MG tablet  Commonly known as:  LEXAPRO     ibuprofen 200 MG tablet  Commonly known as:  ADVIL,MOTRIN     zolpidem 5 MG tablet  Commonly known as:  AMBIEN      TAKE these medications      Indication   fluvoxaMINE 100 MG tablet  Commonly known as:  LUVOX  Take 1 tablet (100 mg total) by mouth at bedtime.   Indication:  Depression, Obsessive Compulsive Disorder     lamoTRIgine 25 MG tablet  Commonly known as:  LAMICTAL  - Take 1 tablet (25 mg total) by mouth daily. 1 tabs orally once a day (at bedtime) for 10 days.  - 2 tabs for 2 weeks  - 4 tabs for 2 weeks  - 8 tabs or 200 mg. Thereafter for mood stabilization   Indication:  Manic-Depression     LO LOESTRIN FE PO  Take 1 tablet by mouth daily.  Notes to Patient:  For birth control      traZODone 100 MG tablet  Commonly known as:  DESYREL  Take 1 tablet (100 mg total) by mouth at bedtime.   Indication:  Trouble Sleeping        Total Discharge Time: 36  Signed: Kristine Linea 02/06/2015, 2:19 PM

## 2015-02-06 NOTE — [facility] Group Notes (Signed)
Understanding Treatment PsychoEd Group Attended and participated appropriately. Discussed need to continue to take medication and become involved in individual therapy again.

## 2015-02-06 NOTE — [facility] Suicide Risk Assessment (Signed)
[facility] INPATIENT:  Family/Significant Other Suicide Prevention Education  Suicide Prevention Education:  Education Completed; Ryan Mccathern, [place], has been identified by the patient as the family member/significant other with whom the patient will be residing, and identified as the person(s) who will aid the patient in the event of a mental health crisis (suicidal ideations/suicide attempt).  With written consent from the patient, the family member/significant other has been provided the following suicide prevention education, prior to the and/or following the discharge of the patient.  The suicide prevention education provided includes the following:  Suicide risk factors  Suicide prevention and interventions  National Suicide Hotline telephone number  [facility] assessment telephone number  [facility] Emergency Assistance 911  [facility] and/or Residential Mobile Crisis Unit telephone number  Request made of family/significant other to:  Remove weapons (e.g., guns, rifles, knives), all items previously/currently identified as safety concern.    Remove drugs/medications (over-the-counter, prescriptions, illicit drugs), all items previously/currently identified as a safety concern.  The family member/significant other verbalizes understanding of the suicide prevention education information provided.  The family member/significant other agrees to remove the items of safety concern listed above.  Glennon MacLaws, Taft Worthing P 02/06/2015, 11:17 AM

## 2015-02-06 NOTE — Progress Notes (Signed)
Patient denies depression & suicidal ideation.Appropriate with staff &peers.Attended groups.DC instructions explained to patient,verbelized understanding.Priscriptions & 7 days medicines given to patient.Left unit with staff.

## 2015-02-08 NOTE — Progress Notes (Signed)
  [facility] :  Will you be returning to the same living situation after discharge:  Yes,  home with husband At discharge, do you have transportation home?: Yes,  Husband will pick up Do you have the ability to pay for your medications: Yes, Pt completed Medication Management Application to bridge medication until insurance begins next month.  Release of information consent forms completed and in the chart;  Patient's signature needed at discharge.  Patient to Follow up at: Follow-up Information    Follow up with [facility]. Go in 2 days.   Why:   You may walk in on Mondays Wednesdays or Fridays between 8am-3pm for , Hospital Follow up, Referral for Outpatient Medication Management and Therapy   Contact information:   [facility] [address] [place] [address] [phone]; fax-[phone]      Patient denies SI/HI: Yes,       Safety Planning and Suicide Prevention discussed: Yes,        Has patient been referred to the Quitline?: Patient refused referral  Glennon MacLaws, Warren Kugelman P 02/08/2015, 4:20 PM

## 2015-02-10 ENCOUNTER — Ambulatory Visit: Payer: Self-pay

## 2015-08-18 LAB — HM PAP SMEAR

## 2016-12-18 ENCOUNTER — Ambulatory Visit: Payer: Self-pay | Admitting: Certified Nurse Midwife

## 2017-01-31 ENCOUNTER — Encounter: Payer: Self-pay | Admitting: Certified Nurse Midwife

## 2017-01-31 ENCOUNTER — Ambulatory Visit (INDEPENDENT_AMBULATORY_CARE_PROVIDER_SITE_OTHER): Payer: 59 | Admitting: Certified Nurse Midwife

## 2017-01-31 VITALS — BP 120/70 | HR 88 | Ht 69.0 in | Wt 224.0 lb

## 2017-01-31 DIAGNOSIS — N941 Unspecified dyspareunia: Secondary | ICD-10-CM | POA: Diagnosis not present

## 2017-01-31 DIAGNOSIS — Z01419 Encounter for gynecological examination (general) (routine) without abnormal findings: Secondary | ICD-10-CM | POA: Diagnosis not present

## 2017-01-31 DIAGNOSIS — N946 Dysmenorrhea, unspecified: Secondary | ICD-10-CM | POA: Diagnosis not present

## 2017-01-31 DIAGNOSIS — N920 Excessive and frequent menstruation with regular cycle: Secondary | ICD-10-CM | POA: Diagnosis not present

## 2017-01-31 DIAGNOSIS — R102 Pelvic and perineal pain: Secondary | ICD-10-CM

## 2017-01-31 DIAGNOSIS — Z124 Encounter for screening for malignant neoplasm of cervix: Secondary | ICD-10-CM | POA: Diagnosis not present

## 2017-01-31 NOTE — Progress Notes (Signed)
Gynecology Annual Exam  PCP: Dr Mylinda Latina Chief Complaint:  Chief Complaint  Patient presents with  . Gynecologic Exam    History of Present Illness: Jill [name] presents today for her annual exam. She is a 31 year old Caucasian  female, G0, whose LMP was 01/13/2017. She complains of heavy menses. They are every month, last 5 days with 3 heavy days requiring pad changes every hour and are with nickel sized clots.  She has cramping that begins 2 weeks before her menses starts. She rates this pain 6/10 and is constant, but she will intermittently have sharp stabbing pain in the RLQ. The pain radiates to her right thigh. The pain stops when through her heaviest flow days. Reports having this pain for 2 years. Ibuprofen is not helpful in relieving the pain. A heating pad is helpful. She currently uses condoms for birth control. The patient's past medical history is remarkable for bipolar disorder with depression and migraine headaches,  Since her last annual GYN exam dated 08/18/2015, she has had no significant changes in her health history.    Her most recent pap smear was obtained 08/18/2015 and was negative.  Mammogram is not applicable. There is no family history of breast cancer. There is no family history of ovarian cancer. The patient does not do monthly self breast exams.  The patient does not smoke.  The patient does not drink alcohol.  The patient does not use illegal drugs.  The patient exercises regularly by walking She has a BMI of 33.08 kg/m2 The patient does get adequate calcium in her diet.  She has completed the Gardasil vaccine series.    Review of Systems: Review of Systems  Constitutional: Negative for chills, fever and weight loss.  HENT: Negative for congestion, sinus pain and sore throat.   Eyes: Negative for blurred vision and pain.  Respiratory: Negative for hemoptysis, shortness of breath and wheezing.   Cardiovascular: Negative for chest pain, palpitations and  leg swelling.  Gastrointestinal: Positive for abdominal pain. Negative for blood in stool, diarrhea, heartburn, nausea and vomiting.  Genitourinary: Negative for dysuria, frequency, hematuria and urgency.       Positive for dysmenorrhea and menorrhagia  Musculoskeletal: Negative for back pain, joint pain and myalgias.  Skin: Negative for itching and rash.  Neurological: Negative for dizziness, tingling and headaches.  Endo/Heme/Allergies: Negative for environmental allergies and polydipsia. Does not bruise/bleed easily.       Negative for hirsutism   Psychiatric/Behavioral: Negative for depression. The patient is not nervous/anxious and does not have insomnia.     Past Medical History:  Past Medical History:  Diagnosis Date  . Bipolar 1 disorder (HCC)   . Depression   . Migraine   . OCD (obsessive compulsive disorder)   . TMJ (dislocation of temporomandibular joint)     Past Surgical History:  Past Surgical History:  Procedure Laterality Date  . NO PAST SURGERIES    . None       Family History:  Family History  Problem Relation Age of Onset  . Depression Mother   . Alcohol abuse Father   . Lung cancer Maternal Grandmother   . Hypertension Maternal Grandmother   . Hypertension Maternal Grandfather   . Stroke Maternal Grandfather   . Hypertension Paternal Grandmother   . Hypertension Paternal Grandfather     Social History:  Social History   Social History  . Marital status: Married    Spouse name: [name]  . Number of  children: N/A  . Years of education: N/A   Occupational History  . Lab assisstant    Social History Main Topics  . Smoking status: Never Smoker  . Smokeless tobacco: Never Used  . Alcohol use No  . Drug use: No  . Sexual activity: Yes    Partners: Male    Birth control/ protection: Condom   Other Topics Concern  . Not on file   Social History Narrative   She is originally from [place] and graduated high school in [place]. She has  been married for approximately 2years and has no children. She has been working for [employer] for the past one month. She currently lives with her husband. She denies any physical or sexual abuse.    Allergies:  Allergies  Allergen Reactions  . Amoxicillin Anaphylaxis, Rash and Shortness Of Breath    Childhood reaction   . Penicillins     Pt does not think that she is allergic to this medication but is not sure     Medications: Prior to Admission medications   Medication Sig Start Date End Date Taking? Authorizing Provider  fluvoxaMINE (LUVOX) 100 MG tablet Take by mouth.    Historical Provider, MD  lamoTRIgine (LAMICTAL) 200 MG tablet Take by mouth.    Historical Provider, MD  And a multivitamin  Physical Exam Vitals: BP 120/70   Pulse 88   Ht  (1.753 m)   Wt 101.6 kg (224 lb)   LMP 01/09/2017 (Exact Date)   BMI 33.08 kg/m   General: WF, well developed, well nourished in NAD HEENT: normocephalic, anicteric Thyroid: no enlargement, no palpable nodules Pulmonary: No increased work of breathing, CTAB Cardiovascular: RRR without murmur Breast: Breast symmetrical, no tenderness, no palpable nodules or masses, no skin or nipple retraction present, no nipple discharge.  No axillary, infraclavicular or supraclavicular lymphadenopathy. Abdomen: soft, non-tender, non-distended.  Umbilicus without lesions.  No hepatomegalyor masses palpable. No evidence of hernia  Genitourinary:  External: Normal external female genitalia.  Normal urethral meatus, normal Bartholin's and Skene's glands.    Vagina: Normal vaginal mucosa, no evidence of prolapse.    Cervix: Grossly normal in appearance, no bleeding  Uterus: midplane, mobile, NT, NSSC  Adnexa: no adnexal masses, tenderness on right adnexa with palpation  Rectal: deferred  Lymphatic: no evidence of inguinal lymphadenopathy Extremities: no edema, erythema, or tenderness Neurologic: Grossly intact Psychiatric: mood appropriate, affect  full      Assessment: 31 y.o. G0P0000 well woman exam Pelvic pain (esp RLQ)/ menorrhagia and dyspareunia  R/O endometriosis, ovarian mass  Plan:   1)Pap done  2). Discussed further evaluation of pelvic pain and dyspareunia and menorrhagia with pelvic ultrasound. Will have patient follow up after ultrasound with MD to discuss possible laparoscopy for evaluation of pelvic pain. Also discussed possible treatments depending on cause (continuous pills, IUD, Depo, Lupron if endometriosis).  Farrel Conners, CNM

## 2017-01-31 NOTE — Patient Instructions (Signed)
Endometriosis Endometriosis is a condition in which the tissue that lines the uterus (endometrium) grows outside of its normal location. The tissue may grow in many locations close to the uterus, but it commonly grows on the ovaries, fallopian tubes, vagina, or bowel. When the uterus sheds the endometrium every menstrual cycle, there is bleeding wherever the endometrial tissue is located. This can cause pain because blood is irritating to tissues that are not normally exposed to it. What are the causes? The cause of endometriosis is not known. What increases the risk? You may be more likely to develop endometriosis if you:  Have a family history of endometriosis.  Have never given birth.  Started your period at age 10 or younger.  Have high levels of estrogen in your body.  Were exposed to a certain medicine (diethylstilbestrol) before you were born (in utero).  Had low birth weight.  Were born as a twin, triplet, or other multiple.  Have a BMI of less than 25. BMI is an estimate of body fat and is calculated from height and weight. What are the signs or symptoms? Often, there are no symptoms of this condition. If you do have symptoms, they may:  Vary depending on where your endometrial tissue is growing.  Occur during your menstrual period (most common) or midcycle.  Come and go, or you may go months with no symptoms at all.  Stop with menopause. Symptoms may include:  Pain in the back or abdomen.  Heavier bleeding during periods.  Pain during sex.  Painful bowel movements.  Infertility.  Pelvic pain.  Bleeding more than once a month. How is this diagnosed? This condition is diagnosed based on your symptoms and a physical exam. You may have tests, such as:  Blood tests and urine tests. These may be done to help rule out other possible causes of your symptoms.  Ultrasound, to look for abnormal tissues.  An X-ray of the lower bowel (barium enema).  An ultrasound  that is done through the vagina (transvaginally).  CT scan.  MRI.  Laparoscopy. In this procedure, a lighted, pencil-sized instrument called a laparoscope is inserted into your abdomen through an incision. The laparoscope allows your health care provider to look at the organs inside your body and check for abnormal tissue to confirm the diagnosis. If abnormal tissue is found, your health care provider may remove a small piece of tissue (biopsy) to be examined under a microscope. How is this treated? Treatment for this condition may include:  Medicines to relieve pain, such as NSAIDs.  Hormone therapy. This involves using artificial (synthetic) hormones to reduce endometrial tissue growth. Your health care provider may recommend using a hormonal form of birth control, or other medicines.  Surgery. This may be done to remove abnormal endometrial tissue.  In some cases, tissue may be removed using a laparoscope and a laser (laparoscopic laser treatment).  In severe cases, surgery may be done to remove the fallopian tubes, uterus, and ovaries (hysterectomy). Follow these instructions at home:  Take over-the-counter and prescription medicines only as told by your health care provider.  Do not drive or use heavy machinery while taking prescription pain medicine.  Try to avoid activities that cause pain, including sexual activity.  Keep all follow-up visits as told by your health care provider. This is important. Contact a health care provider if:  You have pain in the area between your hip bones (pelvic area) that occurs:  Before, during, or after your period.  In   between your period and gets worse during your period.  During or after sex.  With bowel movements or urination, especially during your period.  You have problems getting pregnant.  You have a fever. Get help right away if:  You have severe pain that does not get better with medicine.  You have severe nausea and  vomiting, or you cannot eat without vomiting.  You have pain that affects only the lower, right side of your abdomen.  You have abdominal pain that gets worse.  You have abdominal swelling.  You have blood in your stool. This information is not intended to replace advice given to you by your health care provider. Make sure you discuss any questions you have with your health care provider. Document Released: 09/20/2000 Document Revised: 06/28/2016 Document Reviewed: 02/24/2016 Elsevier Interactive Patient Education  2017 Elsevier Inc.  

## 2017-02-01 ENCOUNTER — Encounter: Payer: Self-pay | Admitting: Certified Nurse Midwife

## 2017-02-01 DIAGNOSIS — N920 Excessive and frequent menstruation with regular cycle: Secondary | ICD-10-CM | POA: Insufficient documentation

## 2017-02-01 DIAGNOSIS — N946 Dysmenorrhea, unspecified: Secondary | ICD-10-CM | POA: Insufficient documentation

## 2017-02-01 DIAGNOSIS — N941 Unspecified dyspareunia: Secondary | ICD-10-CM | POA: Insufficient documentation

## 2017-02-05 LAB — IGP, APTIMA HPV
HPV APTIMA: NEGATIVE
PAP Smear Comment: 0

## 2017-02-18 ENCOUNTER — Ambulatory Visit (INDEPENDENT_AMBULATORY_CARE_PROVIDER_SITE_OTHER): Payer: 59

## 2017-02-18 ENCOUNTER — Ambulatory Visit: Payer: 59 | Admitting: Obstetrics and Gynecology

## 2017-02-18 DIAGNOSIS — N920 Excessive and frequent menstruation with regular cycle: Secondary | ICD-10-CM | POA: Diagnosis not present

## 2017-02-18 DIAGNOSIS — N941 Unspecified dyspareunia: Secondary | ICD-10-CM | POA: Diagnosis not present

## 2017-02-18 DIAGNOSIS — N946 Dysmenorrhea, unspecified: Secondary | ICD-10-CM

## 2017-02-18 DIAGNOSIS — R102 Pelvic and perineal pain: Secondary | ICD-10-CM

## 2017-02-20 ENCOUNTER — Encounter: Payer: Self-pay | Admitting: Obstetrics and Gynecology

## 2017-02-20 ENCOUNTER — Ambulatory Visit (INDEPENDENT_AMBULATORY_CARE_PROVIDER_SITE_OTHER): Payer: 59 | Admitting: Obstetrics and Gynecology

## 2017-02-20 DIAGNOSIS — R102 Pelvic and perineal pain: Secondary | ICD-10-CM | POA: Diagnosis not present

## 2017-02-20 NOTE — Progress Notes (Signed)
Gynecology Ultrasound Follow Up   Chief Complaint  Patient presents with  . Follow-up    U/S Follow Up  chronic Pelvic pain   History of Present Illness: Patient is a 31 y.o. female who presents today for ultrasound evaluation of chronic pelvic pain .  Ultrasound demonstrates the following findings Adnexa: no masses seen  Uterus: anteverted with endometrial stripe  7.7 mm Additional: scant free fluid  Patient with nearly 2 year history of right lower quadrant pelvic pain that radiates to her right leg.  Pain is described as sharp, stabbing. Nothing makes it better or worse. No associated symptoms.  Not related to bowel or bladder habits, though she does feel some discomfort when voiding while she is having the pain.  Denies hematochezia, melena, hematuria. She occasionally has nausea without emesis.  The pain usually starts about 1 week after her menses, which is regular.    Review of Systems: Review of Systems  Constitutional: Negative.   HENT: Negative.   Eyes: Negative.   Respiratory: Negative.   Cardiovascular: Negative.   Gastrointestinal:       Per HPI  Genitourinary: Negative.   Musculoskeletal: Negative.   Skin: Negative.   Neurological: Negative.   Psychiatric/Behavioral: Negative.     Past Medical History:  Diagnosis Date  . Bipolar 1 disorder (HCC)   . Depression   . Migraine   . OCD (obsessive compulsive disorder)   . TMJ (dislocation of temporomandibular joint)     Past Surgical History:  Procedure Laterality Date  . NO PAST SURGERIES    . None      Family History  Problem Relation Age of Onset  . Depression Mother   . Alcohol abuse Father   . Lung cancer Maternal Grandmother   . Hypertension Maternal Grandmother   . Hypertension Maternal Grandfather   . Stroke Maternal Grandfather   . Hypertension Paternal Grandmother   . Hypertension Paternal Grandfather     Social History   Social History  . Marital status: Married    Spouse name:  [name]  . Number of children: N/A  . Years of education: N/A   Occupational History  . Lab assisstant    Social History Main Topics  . Smoking status: Never Smoker  . Smokeless tobacco: Never Used  . Alcohol use No  . Drug use: No  . Sexual activity: Yes    Partners: Male    Birth control/ protection: Condom   Other Topics Concern  . Not on file   Social History Narrative   She is originally from [place] and graduated high school in [place]. She has been married for approximately 2years and has no children. She has been working for [employer] for the past one month. She currently lives with her husband. She denies any physical or sexual abuse.    Allergies  Allergen Reactions  . Amoxicillin Anaphylaxis, Rash and Shortness Of Breath    Childhood reaction   . Penicillins     Pt does not think that she is allergic to this medication but is not sure     Medications:   Medication Sig Start Date End Date Taking? Authorizing Provider  fluvoxaMINE (LUVOX) 100 MG tablet Take by mouth.    [provider]  lamoTRIgine (LAMICTAL) 200 MG tablet Take by mouth.    [provider]  Multiple Vitamin (MULTIVITAMIN) tablet Take 1 tablet by mouth daily.    [provider]    Physical Exam Vitals: Blood pressure 118/76, height  5\' 9"  (1.753 m), weight 228 lb (103.4 kg), last menstrual period 02/04/2017.  General: NAD HEENT: normocephalic, anicteric Pulmonary: No increased work of breathing Extremities: no edema, erythema, or tenderness Neurologic: Grossly intact, normal gait Psychiatric: mood appropriate, affect full   Assessment: 31 y.o. G0P0000 with chronic pelvic pain  Plan: Problem List Items Addressed This Visit    Pelvic pain in female (Chronic)     Discussed differential diagnosis and that this could be related to her GI, GU, MSK, and could be psychiatric related.  Given the timing of her pain, endometriosis is a strong consideration. Discussed  treatment with progesterone-only therapy. However, she is interested in a definitive diagnosis. Discussed that diagnostic laparoscopy is the gold standard for diagnosis.  Discussed that I could not guarantee with endometriosis would be found nor that any source of her pain would be found. She would like to proceed as this has been bothersome to her for a long time and would like to get some answers.   Will schedule for diagnostic laparoscopy. I personally reviewed all ultrasound images and spent 20 minutes in face-to-face discussion with her with > 50% of the time spent in management and counseling discussion regarding her pelvic pain.  Thomasene MohairStephen Jackson, MD 02/20/2017 5:21 PM

## 2017-02-21 ENCOUNTER — Telehealth: Payer: Self-pay | Admitting: Obstetrics and Gynecology

## 2017-02-21 NOTE — Telephone Encounter (Signed)
-----   Message from Conard NovakStephen D Jackson, MD sent at 02/20/2017  5:22 PM EDT ----- Regarding: Schedule Surgery Surgery Booking Request Patient Full Name: Marcelyn DittyMegan Ann Hagan  MRN: [id number]  DOB: [birth year]  Surgeon: Thomasene MohairStephen Jackson, MD  Requested Surgery Date and Time: within the next month Primary Diagnosis and Code: Pelvic pain female (R10.2) Secondary Diagnosis and Code:  Surgical Procedure: diagnostic laparoscopy L&D Notification: n/a Admission Status: same day surgery Length of Surgery: 45 minutes Special Case Needs: none H&P: tbd (date) Phone Interview or Office Pre-Admit: phone Interpreter: n/a Language: english Medical Clearance: none Special Scheduling Instructions: none

## 2017-02-21 NOTE — Telephone Encounter (Signed)
Lmtrc

## 2017-02-24 NOTE — Telephone Encounter (Signed)
Lmtrc

## 2017-02-26 NOTE — Telephone Encounter (Signed)
Lmtrc

## 2017-02-26 NOTE — Telephone Encounter (Signed)
Patient is aware of H&P on 03/10/17 @ 4:30pm w/ phone interview afterwards, and OR on 03/21/17. Insurance benefits (deductible, coinsurance, and out of pocket) were discussed. Patient will [facility]. Patient is aware [facility] will send a statement after insurance is processed. Patient will call back tomorrow if she needs to move the surgery date. Patient has my phone# and ext.

## 2017-03-10 ENCOUNTER — Encounter: Payer: Self-pay | Admitting: Obstetrics and Gynecology

## 2017-03-10 ENCOUNTER — Ambulatory Visit (INDEPENDENT_AMBULATORY_CARE_PROVIDER_SITE_OTHER): Payer: 59 | Admitting: Obstetrics and Gynecology

## 2017-03-10 VITALS — BP 102/60 | HR 92 | Ht 69.0 in | Wt 228.0 lb

## 2017-03-10 DIAGNOSIS — R102 Pelvic and perineal pain: Secondary | ICD-10-CM

## 2017-03-10 DIAGNOSIS — N946 Dysmenorrhea, unspecified: Secondary | ICD-10-CM

## 2017-03-10 NOTE — Progress Notes (Signed)
Preoperative History and Physical  Jill [name] is a 31 y.o. G0P0000 here for surgical management of chronic pelvic pain.   No significant preoperative concerns.  History of Present Illness: Nearly 2 year history of RLQ pelvic pain, radiates to her right leg.  Pain described as sharp, stabbing. Nothing makes it better or worse. No associated symptoms.  Ultrasound on 02/20/17 essentially negative.   Proposed surgery: diagnostic laparoscopy  Past Medical History:  Diagnosis Date  . Bipolar 1 disorder (HCC)   . Depression   . Migraine   . OCD (obsessive compulsive disorder)   . TMJ (dislocation of temporomandibular joint)    Past Surgical History:  Procedure Laterality Date  . NO PAST SURGERIES    . None     OB History  Gravida Para Term Preterm AB Living  0 0 0 0 0 0  SAB TAB Ectopic Multiple Live Births  0 0 0 0 0      Patient denies any other pertinent gynecologic issues.   Current Outpatient Prescriptions on File Prior to Visit  Medication Sig Dispense Refill  . fluvoxaMINE (LUVOX) 100 MG tablet Take by mouth.    . lamoTRIgine (LAMICTAL) 200 MG tablet Take by mouth.    . Multiple Vitamin (MULTIVITAMIN) tablet Take 1 tablet by mouth daily.     No current facility-administered medications on file prior to visit.    Allergies  Allergen Reactions  . Amoxicillin Anaphylaxis, Rash and Shortness Of Breath    Childhood reaction   . Penicillins     Pt does not think that she is allergic to this medication but is not sure     Social History:   reports that she has never smoked. She has never used smokeless tobacco. She reports that she does not drink alcohol or use drugs.  Family History  Problem Relation Age of Onset  . Depression Mother   . Alcohol abuse Father   . Lung cancer Maternal Grandmother   . Hypertension Maternal Grandmother   . Hypertension Maternal Grandfather   . Stroke Maternal Grandfather   . Hypertension Paternal Grandmother   .  Hypertension Paternal Grandfather     Review of Systems: Noncontributory  PHYSICAL EXAM: Blood pressure 102/60, pulse 92, height 5\' 9"  (1.753 m), weight 228 lb (103.4 kg), last menstrual period 02/28/2017. Physical Exam  Constitutional: She is oriented to person, place, and time and well-developed, well-nourished, and in no distress. No distress.  HENT:  Head: Normocephalic and atraumatic.  Eyes: Conjunctivae are normal. Left eye exhibits no discharge. No scleral icterus.  Neck: Normal range of motion. Neck supple.  Cardiovascular: Normal rate and regular rhythm.  Exam reveals no gallop and no friction rub.   No murmur heard. Pulmonary/Chest: Effort normal and breath sounds normal. No respiratory distress. She has no wheezes. She has no rales.  Abdominal: Soft. Bowel sounds are normal. She exhibits no distension and no mass. There is no tenderness. There is no rebound and no guarding.  Musculoskeletal: Normal range of motion. She exhibits no edema.  Neurological: She is alert and oriented to person, place, and time. No cranial nerve deficit.  Skin: Skin is warm and dry.  Psychiatric: Mood, affect and judgment normal.    Assessment: 31 y.o. G0P0000 female with chronic pelvic pain.   Plan: Patient will undergo surgical management with diagnostic laparoscopy.   The risks of surgery were discussed in detail with the patient including but not limited to: bleeding which may require transfusion  or reoperation; infection which may require antibiotics; injury to surrounding organs which may involve bowel, bladder, ureters ; need for additional procedures including laparoscopy or laparotomy; thromboembolic phenomenon, surgical site problems and other postoperative/anesthesia complications. Likelihood of success in alleviating the patient's condition was discussed. Routine postoperative instructions will be reviewed with the patient and her family in detail after surgery.  The patient concurred with  the proposed plan, giving informed written consent for the surgery.  Preoperative prophylactic antibiotics and SCDs ordered on call to the OR.    See prior notes. Patient declines presumptive therapy and would like a diagnosis with laparoscopy. She understands there may be negative findings on laparoscopy and her pain source may go undiagnosed.  Thomasene MohairStephen River Ambrosio, MD 03/10/2017 5:08 PM

## 2017-03-14 ENCOUNTER — Encounter
Admission: RE | Admit: 2017-03-14 | Discharge: 2017-03-14 | Disposition: A | Discharge status: Home / Self Care | Payer: 59 | Source: Ambulatory Visit | Attending: Obstetrics and Gynecology | Admitting: Obstetrics and Gynecology

## 2017-03-21 ENCOUNTER — Ambulatory Visit: Payer: 59 | Admitting: Anesthesiology

## 2017-03-21 ENCOUNTER — Ambulatory Visit
Admission: RE | Admit: 2017-03-21 | Discharge: 2017-03-21 | Disposition: A | Discharge status: Home / Self Care | Payer: 59 | Source: Ambulatory Visit | Attending: Obstetrics and Gynecology | Admitting: Obstetrics and Gynecology

## 2017-03-21 ENCOUNTER — Encounter
Admission: RE | Disposition: A | Discharge status: Home / Self Care | Payer: Self-pay | Source: Ambulatory Visit | Attending: Obstetrics and Gynecology

## 2017-03-21 ENCOUNTER — Encounter: Payer: Self-pay | Admitting: *Deleted

## 2017-03-21 DIAGNOSIS — F319 Bipolar disorder, unspecified: Secondary | ICD-10-CM | POA: Diagnosis not present

## 2017-03-21 DIAGNOSIS — F429 Obsessive-compulsive disorder, unspecified: Secondary | ICD-10-CM | POA: Insufficient documentation

## 2017-03-21 DIAGNOSIS — K660 Peritoneal adhesions (postprocedural) (postinfection): Secondary | ICD-10-CM | POA: Diagnosis not present

## 2017-03-21 DIAGNOSIS — N800 Endometriosis of uterus: Secondary | ICD-10-CM | POA: Diagnosis not present

## 2017-03-21 DIAGNOSIS — Z880 Allergy status to penicillin: Secondary | ICD-10-CM | POA: Insufficient documentation

## 2017-03-21 DIAGNOSIS — N803 Endometriosis of pelvic peritoneum: Secondary | ICD-10-CM | POA: Insufficient documentation

## 2017-03-21 DIAGNOSIS — R102 Pelvic and perineal pain: Secondary | ICD-10-CM

## 2017-03-21 HISTORY — PX: LAPAROSCOPIC LYSIS OF ADHESIONS: SHX5905

## 2017-03-21 HISTORY — PX: LAPAROSCOPY: SHX197

## 2017-03-21 LAB — HCG, QUANTITATIVE, PREGNANCY: hCG, Beta Chain, Quant, S: <1 m[IU]/mL (ref <5)

## 2017-03-21 SURGERY — LAPAROSCOPY, DIAGNOSTIC
Anesthesia: General

## 2017-03-21 MED ORDER — IBUPROFEN 600 MG PO TABS: 600.0000 mg | ORAL_TABLET | Freq: Four times a day (QID) | ORAL | 0 refills | Status: DC | PRN

## 2017-03-21 MED ORDER — MIDAZOLAM HCL 2 MG/2ML IJ SOLN
INTRAMUSCULAR | Status: DC | PRN
  Administered 2017-03-21: 2 mg via INTRAVENOUS

## 2017-03-21 MED ORDER — PROPOFOL 10 MG/ML IV BOLUS
INTRAVENOUS | Status: DC | PRN
Start: 2017-03-21 — End: 2017-03-21
  Administered 2017-03-21: 150 mg via INTRAVENOUS

## 2017-03-21 MED ORDER — HYDROCODONE-ACETAMINOPHEN 5-325 MG PO TABS: 1.0000 | ORAL_TABLET | Freq: Four times a day (QID) | ORAL | 0 refills | Status: DC | PRN

## 2017-03-21 MED ORDER — LIDOCAINE HCL (CARDIAC) 20 MG/ML IV SOLN
INTRAVENOUS | Status: DC | PRN
  Administered 2017-03-21: 100 mg via INTRAVENOUS

## 2017-03-21 MED ORDER — IBUPROFEN 600 MG PO TABS
ORAL_TABLET | ORAL | Status: AC
Start: 2017-03-21 — End: 2017-03-21
  Administered 2017-03-21: 600 mg
  Filled 2017-03-21: qty 1

## 2017-03-21 MED ORDER — FENTANYL CITRATE (PF) 100 MCG/2ML IJ SOLN
INTRAMUSCULAR | Status: DC | PRN
Start: 2017-03-21 — End: 2017-03-21
  Administered 2017-03-21: 100 ug via INTRAVENOUS

## 2017-03-21 MED ORDER — FAMOTIDINE 20 MG PO TABS
ORAL_TABLET | ORAL | Status: AC
  Filled 2017-03-21: qty 1

## 2017-03-21 MED ORDER — LIDOCAINE HCL (PF) 2 % IJ SOLN
INTRAMUSCULAR | Status: AC
  Filled 2017-03-21: qty 2

## 2017-03-21 MED ORDER — ONDANSETRON HCL 4 MG/2ML IJ SOLN
INTRAMUSCULAR | Status: AC
  Filled 2017-03-21: qty 2

## 2017-03-21 MED ORDER — LACTATED RINGERS IV SOLN
INTRAVENOUS | Status: DC
  Administered 2017-03-21: 11:00:00 via INTRAVENOUS

## 2017-03-21 MED ORDER — FENTANYL CITRATE (PF) 100 MCG/2ML IJ SOLN: 25.0000 ug | INTRAMUSCULAR | Status: DC | PRN

## 2017-03-21 MED ORDER — SUGAMMADEX SODIUM 200 MG/2ML IV SOLN
INTRAVENOUS | Status: DC | PRN
  Administered 2017-03-21: 100 mg via INTRAVENOUS

## 2017-03-21 MED ORDER — DEXAMETHASONE SODIUM PHOSPHATE 10 MG/ML IJ SOLN
INTRAMUSCULAR | Status: DC | PRN
  Administered 2017-03-21: 10 mg via INTRAVENOUS

## 2017-03-21 MED ORDER — FENTANYL CITRATE (PF) 100 MCG/2ML IJ SOLN
INTRAMUSCULAR | Status: AC
  Filled 2017-03-21: qty 2

## 2017-03-21 MED ORDER — KETOROLAC TROMETHAMINE 30 MG/ML IJ SOLN
INTRAMUSCULAR | Status: AC
  Filled 2017-03-21: qty 1

## 2017-03-21 MED ORDER — DEXAMETHASONE SODIUM PHOSPHATE 10 MG/ML IJ SOLN
INTRAMUSCULAR | Status: AC
  Filled 2017-03-21: qty 1

## 2017-03-21 MED ORDER — BUPIVACAINE HCL 0.5 % IJ SOLN
INTRAMUSCULAR | Status: DC | PRN
  Administered 2017-03-21: 7 mL

## 2017-03-21 MED ORDER — KETOROLAC TROMETHAMINE 30 MG/ML IJ SOLN
INTRAMUSCULAR | Status: DC | PRN
  Administered 2017-03-21: 15 mg via INTRAVENOUS

## 2017-03-21 MED ORDER — SUCCINYLCHOLINE CHLORIDE 20 MG/ML IJ SOLN
INTRAMUSCULAR | Status: DC | PRN
  Administered 2017-03-21: 100 mg via INTRAVENOUS

## 2017-03-21 MED ORDER — FAMOTIDINE 20 MG PO TABS
20.0000 mg | ORAL_TABLET | Freq: Once | ORAL | Status: AC
  Administered 2017-03-21: 20 mg via ORAL

## 2017-03-21 MED ORDER — IBUPROFEN 100 MG/5ML PO SUSP: 600.0000 mg | Freq: Four times a day (QID) | ORAL | Status: DC | PRN

## 2017-03-21 MED ORDER — SUGAMMADEX SODIUM 200 MG/2ML IV SOLN
INTRAVENOUS | Status: AC
  Filled 2017-03-21: qty 2

## 2017-03-21 MED ORDER — BUPIVACAINE HCL (PF) 0.5 % IJ SOLN
INTRAMUSCULAR | Status: AC
  Filled 2017-03-21: qty 30

## 2017-03-21 MED ORDER — ROCURONIUM BROMIDE 50 MG/5ML IV SOLN
INTRAVENOUS | Status: AC
  Filled 2017-03-21: qty 1

## 2017-03-21 MED ORDER — GLYCOPYRROLATE 0.2 MG/ML IJ SOLN
INTRAMUSCULAR | Status: AC
  Filled 2017-03-21: qty 1

## 2017-03-21 MED ORDER — ONDANSETRON HCL 4 MG/2ML IJ SOLN
4.0000 mg | Freq: Once | INTRAMUSCULAR | Status: AC | PRN
  Administered 2017-03-21: 4 mg via INTRAVENOUS

## 2017-03-21 MED ORDER — ROCURONIUM BROMIDE 100 MG/10ML IV SOLN
INTRAVENOUS | Status: DC | PRN
  Administered 2017-03-21: 20 mg via INTRAVENOUS
  Administered 2017-03-21: 10 mg via INTRAVENOUS

## 2017-03-21 MED ORDER — MIDAZOLAM HCL 2 MG/2ML IJ SOLN
INTRAMUSCULAR | Status: AC
Start: 2017-03-21 — End: ?
  Filled 2017-03-21: qty 2

## 2017-03-21 MED ORDER — PROPOFOL 10 MG/ML IV BOLUS
INTRAVENOUS | Status: AC
  Filled 2017-03-21: qty 20

## 2017-03-21 SURGICAL SUPPLY — 42 items
BAG URINE DRAINAGE (UROLOGICAL SUPPLIES) ×4 IMPLANT
BLADE SURG SZ11 CARB STEEL (BLADE) ×4 IMPLANT
CANISTER SUCT 1200ML W/VALVE (MISCELLANEOUS) ×4 IMPLANT
CATH FOLEY 2WAY  5CC 16FR (CATHETERS) ×2
CATH URTH 16FR FL 2W BLN LF (CATHETERS) ×2 IMPLANT
CHLORAPREP W/TINT 26ML (MISCELLANEOUS) ×4 IMPLANT
DERMABOND ADVANCED (GAUZE/BANDAGES/DRESSINGS) ×2
DERMABOND ADVANCED .7 DNX12 (GAUZE/BANDAGES/DRESSINGS) ×2 IMPLANT
DRAPE LEGGINS SURG 28X43 STRL (DRAPES) ×4 IMPLANT
DRAPE SHEET LG 3/4 BI-LAMINATE (DRAPES) ×4 IMPLANT
DRAPE UNDER BUTTOCK W/FLU (DRAPES) ×4 IMPLANT
ELECT REM PT RETURN 9FT ADLT (ELECTROSURGICAL) ×4
ELECTRODE REM PT RTRN 9FT ADLT (ELECTROSURGICAL) ×2 IMPLANT
GLOVE BIO SURGEON STRL SZ7 (GLOVE) ×8 IMPLANT
GLOVE BIOGEL PI IND STRL 7.5 (GLOVE) ×2 IMPLANT
GLOVE BIOGEL PI INDICATOR 7.5 (GLOVE) ×2
GOWN STRL REUS W/ TWL LRG LVL3 (GOWN DISPOSABLE) ×4 IMPLANT
GOWN STRL REUS W/TWL LRG LVL3 (GOWN DISPOSABLE) ×4
IRRIGATION STRYKERFLOW (MISCELLANEOUS) ×2 IMPLANT
IRRIGATOR STRYKERFLOW (MISCELLANEOUS) ×4
IV LACTATED RINGERS 1000ML (IV SOLUTION) ×4 IMPLANT
KIT RM TURNOVER CYSTO AR (KITS) ×4 IMPLANT
LABEL OR SOLS (LABEL) ×4 IMPLANT
NDL SAFETY 22GX1.5 (NEEDLE) ×4 IMPLANT
NS IRRIG 500ML POUR BTL (IV SOLUTION) ×4 IMPLANT
PACK LAP CHOLECYSTECTOMY (MISCELLANEOUS) ×4 IMPLANT
PAD OB MATERNITY 4.3X12.25 (PERSONAL CARE ITEMS) ×4 IMPLANT
PAD PREP 24X41 OB/GYN DISP (PERSONAL CARE ITEMS) ×4 IMPLANT
SCISSORS METZENBAUM CVD 33 (INSTRUMENTS) ×4 IMPLANT
SHEARS HARMONIC ACE PLUS 36CM (ENDOMECHANICALS) IMPLANT
SLEEVE ENDOPATH XCEL 5M (ENDOMECHANICALS) ×8 IMPLANT
SOL PREP PVP 2OZ (MISCELLANEOUS) ×4
SOLUTION PREP PVP 2OZ (MISCELLANEOUS) ×2 IMPLANT
SURGILUBE 2OZ TUBE FLIPTOP (MISCELLANEOUS) ×4 IMPLANT
SUT MNCRL 4-0 (SUTURE)
SUT MNCRL 4-0 27XMFL (SUTURE)
SUT VIC AB 2-0 UR6 27 (SUTURE) IMPLANT
SUTURE MNCRL 4-0 27XMF (SUTURE) IMPLANT
TROCAR ENDO BLADELESS 11MM (ENDOMECHANICALS) IMPLANT
TROCAR XCEL NON-BLD 5MMX100MML (ENDOMECHANICALS) ×4 IMPLANT
TROCAR XCEL UNIV SLVE 11M 100M (ENDOMECHANICALS) IMPLANT
TUBING INSUFFLATOR HI FLOW (MISCELLANEOUS) ×4 IMPLANT

## 2017-03-21 NOTE — Transfer of Care (Signed)
Immediate Anesthesia Transfer of Care Note  Patient: Jill SleetMegan Ann Pesola  Procedure(s) Performed: Procedure(s): LAPAROSCOPY DIAGNOSTIC/FULGERATION OF ENDOMETRIAL IMPLANTS (N/A) LAPAROSCOPIC LYSIS OF ADHESIONS  Patient Location: PACU  Anesthesia Type:General  Level of Consciousness: awake  Airway & Oxygen Therapy: Patient Spontanous Breathing  Post-op Assessment: Report given to RN  Post vital signs: stable  Last Vitals:  Vitals:   03/21/17 1031 03/21/17 1337  BP: (!) 142/84   Pulse: 87   Resp: 12   Temp: 37 C (P) 36.2 C    Last Pain:  Vitals:   03/21/17 1337  TempSrc:   PainSc: (P) Asleep         Complications: No apparent anesthesia complications

## 2017-03-21 NOTE — Anesthesia Procedure Notes (Signed)
Procedure Name: Intubation Date/Time: 03/21/2017 12:33 PM Performed by: Zetta Bills Pre-anesthesia Checklist: Patient identified, Patient being monitored, Timeout performed, Emergency Drugs available and Suction available Patient Re-evaluated:Patient Re-evaluated prior to inductionOxygen Delivery Method: Circle system utilized Preoxygenation: Pre-oxygenation with 100% oxygen Intubation Type: IV induction Ventilation: Mask ventilation without difficulty Laryngoscope Size: Mac and 3 Grade View: Grade I Tube type: Oral Tube size: 7.0 mm Number of attempts: 1 Airway Equipment and Method: Stylet Placement Confirmation: ETT inserted through vocal cords under direct vision,  positive ETCO2 and breath sounds checked- equal and bilateral Secured at: 21 cm Tube secured with: Tape Dental Injury: Teeth and Oropharynx as per pre-operative assessment

## 2017-03-21 NOTE — Anesthesia Postprocedure Evaluation (Signed)
Anesthesia Post Note  Patient: Mariel SleetMegan Ann Counsell  Procedure(s) Performed: Procedure(s) (LRB): LAPAROSCOPY DIAGNOSTIC/FULGERATION OF ENDOMETRIAL IMPLANTS (N/A) LAPAROSCOPIC LYSIS OF ADHESIONS  Patient location during evaluation: PACU Anesthesia Type: General Level of consciousness: awake and alert and oriented Pain management: pain level controlled Vital Signs Assessment: post-procedure vital signs reviewed and stable Respiratory status: spontaneous breathing Cardiovascular status: blood pressure returned to baseline Anesthetic complications: no     Last Vitals:  Vitals:   03/21/17 1031 03/21/17 1337  BP: (!) 142/84 120/77  Pulse: 87 94  Resp: 12 14  Temp: 37 C 36.2 C    Last Pain:  Vitals:   03/21/17 1337  TempSrc:   PainSc: Asleep                 Edin Kon

## 2017-03-21 NOTE — Interval H&P Note (Signed)
History and Physical Interval Note:  03/21/2017 11:45 AM  Jill [name]  has presented today for surgery, with the diagnosis of pelvic pain  The various methods of treatment have been discussed with the patient and family. After consideration of risks, benefits and other options for treatment, the patient has consented to  Procedure(s): LAPAROSCOPY DIAGNOSTIC (N/A) as a surgical intervention .  The patient's history has been reviewed, patient examined, no change in status, stable for surgery.  I have reviewed the patient's chart and labs.  Questions were answered to the patient's satisfaction.  The consent reviewed and she agrees to proceed.    Thomasene MohairStephen Terrilynn Postell, MD 03/21/2017 11:46 AM

## 2017-03-21 NOTE — Anesthesia Preprocedure Evaluation (Signed)
Anesthesia Evaluation  Patient identified by MRN, date of birth, ID band Patient awake    Reviewed: Allergy & Precautions, NPO status , Patient's Chart, lab work & pertinent test results  History of Anesthesia Complications Negative for: history of anesthetic complications  Airway Mallampati: II       Dental   Pulmonary neg pulmonary ROS,           Cardiovascular negative cardio ROS       Neuro/Psych Depression Bipolar Disorder negative neurological ROS     GI/Hepatic negative GI ROS, Neg liver ROS,   Endo/Other    Renal/GU negative Renal ROS     Musculoskeletal   Abdominal   Peds  Hematology   Anesthesia Other Findings   Reproductive/Obstetrics                             Anesthesia Physical Anesthesia Plan  ASA: II  Anesthesia Plan: General   Post-op Pain Management:    Induction: Intravenous  PONV Risk Score and Plan: 3 and Ondansetron, Dexamethasone, Propofol and Midazolam  Airway Management Planned: Oral ETT  Additional Equipment:   Intra-op Plan:   Post-operative Plan:   Informed Consent: I have reviewed the patients History and Physical, chart, labs and discussed the procedure including the risks, benefits and alternatives for the proposed anesthesia with the patient or authorized representative who has indicated his/her understanding and acceptance.     Plan Discussed with:   Anesthesia Plan Comments:         Anesthesia Quick Evaluation

## 2017-03-21 NOTE — H&P (View-Only) (Signed)
Preoperative History and Physical  Jill [name] is a 31 y.o. G0P0000 here for surgical management of chronic pelvic pain.   No significant preoperative concerns.  History of Present Illness: Nearly 2 year history of RLQ pelvic pain, radiates to her right leg.  Pain described as sharp, stabbing. Nothing makes it better or worse. No associated symptoms.  Ultrasound on 02/20/17 essentially negative.   Proposed surgery: diagnostic laparoscopy  Past Medical History:  Diagnosis Date  . Bipolar 1 disorder (HCC)   . Depression   . Migraine   . OCD (obsessive compulsive disorder)   . TMJ (dislocation of temporomandibular joint)    Past Surgical History:  Procedure Laterality Date  . NO PAST SURGERIES    . None     OB History  Gravida Para Term Preterm AB Living  0 0 0 0 0 0  SAB TAB Ectopic Multiple Live Births  0 0 0 0 0      Patient denies any other pertinent gynecologic issues.   Current Outpatient Prescriptions on File Prior to Visit  Medication Sig Dispense Refill  . fluvoxaMINE (LUVOX) 100 MG tablet Take by mouth.    . lamoTRIgine (LAMICTAL) 200 MG tablet Take by mouth.    . Multiple Vitamin (MULTIVITAMIN) tablet Take 1 tablet by mouth daily.     No current facility-administered medications on file prior to visit.    Allergies  Allergen Reactions  . Amoxicillin Anaphylaxis, Rash and Shortness Of Breath    Childhood reaction   . Penicillins     Pt does not think that she is allergic to this medication but is not sure     Social History:   reports that she has never smoked. She has never used smokeless tobacco. She reports that she does not drink alcohol or use drugs.  Family History  Problem Relation Age of Onset  . Depression Mother   . Alcohol abuse Father   . Lung cancer Maternal Grandmother   . Hypertension Maternal Grandmother   . Hypertension Maternal Grandfather   . Stroke Maternal Grandfather   . Hypertension Paternal Grandmother   .  Hypertension Paternal Grandfather     Review of Systems: Noncontributory  PHYSICAL EXAM: Blood pressure 102/60, pulse 92, height 5\' 9"  (1.753 m), weight 228 lb (103.4 kg), last menstrual period 02/28/2017. Physical Exam  Constitutional: She is oriented to person, place, and time and well-developed, well-nourished, and in no distress. No distress.  HENT:  Head: Normocephalic and atraumatic.  Eyes: Conjunctivae are normal. Left eye exhibits no discharge. No scleral icterus.  Neck: Normal range of motion. Neck supple.  Cardiovascular: Normal rate and regular rhythm.  Exam reveals no gallop and no friction rub.   No murmur heard. Pulmonary/Chest: Effort normal and breath sounds normal. No respiratory distress. She has no wheezes. She has no rales.  Abdominal: Soft. Bowel sounds are normal. She exhibits no distension and no mass. There is no tenderness. There is no rebound and no guarding.  Musculoskeletal: Normal range of motion. She exhibits no edema.  Neurological: She is alert and oriented to person, place, and time. No cranial nerve deficit.  Skin: Skin is warm and dry.  Psychiatric: Mood, affect and judgment normal.    Assessment: 31 y.o. G0P0000 female with chronic pelvic pain.   Plan: Patient will undergo surgical management with diagnostic laparoscopy.   The risks of surgery were discussed in detail with the patient including but not limited to: bleeding which may require transfusion  or reoperation; infection which may require antibiotics; injury to surrounding organs which may involve bowel, bladder, ureters ; need for additional procedures including laparoscopy or laparotomy; thromboembolic phenomenon, surgical site problems and other postoperative/anesthesia complications. Likelihood of success in alleviating the patient's condition was discussed. Routine postoperative instructions will be reviewed with the patient and her family in detail after surgery.  The patient concurred with  the proposed plan, giving informed written consent for the surgery.  Preoperative prophylactic antibiotics and SCDs ordered on call to the OR.    See prior notes. Patient declines presumptive therapy and would like a diagnosis with laparoscopy. She understands there may be negative findings on laparoscopy and her pain source may go undiagnosed.  Thomasene MohairStephen Kylene Zamarron, MD 03/10/2017 5:08 PM

## 2017-03-21 NOTE — Anesthesia Post-op Follow-up Note (Cosign Needed)
Anesthesia QCDR form completed.        

## 2017-03-21 NOTE — Op Note (Signed)
  Operative Note    Pre-Op Diagnosis: chronic pelvic pain in female  Post-Op Diagnosis: chronic pelvic pain in female  Procedures:  1. Diagnostic laparoscopy 2. Fulguration of endometriosis implants 3. Lysis of adhesions  Primary Surgeon: Thomasene MohairStephen Jackson, MD   EBL: 5 mL   IVF: 1,000 mL crystalloid  Urine output: 100 mL clear urine at end of procedure  Specimens: none  Drains: none  Complications: None   Disposition: PACU   Condition: Stable   Findings:  1) normal appearing uterus, fallopian tubes, and ovaries 2) filmy adhesions of cecum to right pelvic brim. Retrocecal appendix without obvious evidence of appendicitis, though not well visualized 3) endometriosis implants along right lower uterus and junction of uterosacral ligament.  Endometriosis implant at junction of right uterosacral ligament and lower uterine segment.  Procedure Summary:  The patient was taken to the operating room where general anesthesia was administered and found to be adequate. She was placed in the dorsal supine lithotomy position in [place] stirrups and prepped and draped in usual sterile fashion. After a timeout was called an indwelling catheter was placed in her bladder. A sterile speculum was placed in the vagina and a sponge-on-a-stick was placed for uterine manipulation. The speculum was removed from the vagina.  Attention was turned to the abdomen where after injection of local anesthetic, a 5 mm infraumbilical incision was made with the scalpel. Entry into the abdomen was obtained via Optiview trocar technique (a blunt entry technique with camera visualization through the obturator upon entry). Verification of entry into the abdomen was obtained using opening pressures. The abdomen was insufflated with CO2. The camera was introduced through the trocar with verification of atraumatic entry.  A 5mm suprapubic port was placed under direct intra-abdominal camera visualization without difficulty.  A  left lower quadrant 5 mm port was similarly placed under direct intra-abdominal camera visualization without difficulty.    A survey of the abdomen and pelvis was undertaken with the above-noted findings.  The adhesions of the cecum to the right pelvic brim were taken down with the cold scissors.  The endometriosis implants were fulgurated with monopolar electrocautery.  No biopsies were taken. All sites that were fulgurated were visualized to be well away from vasculature, bowel, and the ureter.  The ovaries, fallopian tubes, bladder, and rest of the uterus were found to be free of any endometriosis implants.   The procedure was terminated at this point.  The abdomen was desufflated of CO2 and five deep breaths were given by anesthesia to maximize removal of CO2.  All trocars removed.  The skin was closed using 4-0 monocryl and surgical skin glue.    The patient tolerated the procedure well.  Sponge, lap, needle, and instrument counts were correct x 2.  VTE prophylaxis: SCDs. Antibiotic prophylaxis: none indicated nor given. She was awakened in the operating room and was taken to the PACU in stable condition.   Thomasene MohairStephen Jackson, MD 03/21/2017 1:26 PM

## 2017-03-21 NOTE — Discharge Instructions (Signed)

## 2017-03-22 ENCOUNTER — Encounter: Payer: Self-pay | Admitting: Obstetrics and Gynecology

## 2017-03-26 ENCOUNTER — Telehealth: Payer: Self-pay | Admitting: Obstetrics and Gynecology

## 2017-03-26 NOTE — Telephone Encounter (Signed)
Pt is calling this morning wanting to speak with Dr. Jean RosenthalJackson or his Nurse Meriam SpragueBeverly . Please advise

## 2017-03-26 NOTE — Telephone Encounter (Signed)
Tried to call pt, no answer. Please let me know when she calls back. Has post op 6/25

## 2017-03-31 ENCOUNTER — Ambulatory Visit (INDEPENDENT_AMBULATORY_CARE_PROVIDER_SITE_OTHER): Payer: 59 | Admitting: Obstetrics and Gynecology

## 2017-03-31 ENCOUNTER — Encounter: Payer: Self-pay | Admitting: Obstetrics and Gynecology

## 2017-03-31 VITALS — BP 118/70 | Ht 69.0 in | Wt 230.0 lb

## 2017-03-31 DIAGNOSIS — N809 Endometriosis, unspecified: Secondary | ICD-10-CM | POA: Insufficient documentation

## 2017-03-31 MED ORDER — NORETHINDRONE ACETATE 5 MG PO TABS: 2.5000 mg | ORAL_TABLET | Freq: Every day | ORAL | 0 refills | Status: DC

## 2017-03-31 NOTE — Progress Notes (Signed)
   Postoperative Follow-up Patient presents post op from diagnostic laparoscopy 10days ago for pelvic pain.  Subjective: Patient reports marked improvement in her preop symptoms. Eating a regular diet without difficulty. The patient is not having any pain.  Activity: normal activities of daily living.  Objective: Vitals:   03/31/17 1134  BP: 118/70   Vital Signs: BP 118/70   Ht 5\' 9"  (1.753 m)   Wt 230 lb (104.3 kg)   BMI 33.97 kg/m  Constitutional: Well nourished, well developed female in no acute distress.  HEENT: normal Skin: Warm and dry.  Extremity: no edema  Abdomen: Soft, non-tender, normal bowel sounds; no bruits, organomegaly or masses. clean, dry, intact and no erythema, induration, warmth, or tenderness  Assessment: 31 y.o. s/p diagnostic laparosocpy progressing well  Plan: Patient has done well after surgery with no apparent complications.  I have discussed the post-operative course to date, and the expected progress moving forward.  The patient understands what complications to be concerned about.  I will see the patient in routine follow up, or sooner if needed.    Activity plan: No restriction.  Discussed management of endometriosis. Given history of migraine headaches, will start on norethindrone 2.5 mg, initially. F/u 1 month.   Thomasene MohairStephen Corinthian Kemler, MD 03/31/2017, 12:00 PM

## 2017-04-28 ENCOUNTER — Ambulatory Visit (INDEPENDENT_AMBULATORY_CARE_PROVIDER_SITE_OTHER): Payer: 59 | Admitting: Obstetrics and Gynecology

## 2017-04-28 ENCOUNTER — Encounter: Payer: Self-pay | Admitting: Obstetrics and Gynecology

## 2017-04-28 VITALS — BP 124/72 | Ht 69.0 in | Wt 232.0 lb

## 2017-04-28 DIAGNOSIS — N809 Endometriosis, unspecified: Secondary | ICD-10-CM

## 2017-04-28 NOTE — Progress Notes (Signed)
  History of Present Illness:  Jill [name] is a 31 y.o. who was started on norethindrone approximately 2 weeks ago. Since that time, she states that her symptoms are improving.  Pain before was 7/10.  Now is 4/10.  Worried about weight gain and constant hunger.  She has some early emotional symptoms, but these are improving.    PMHx: She  has a past medical history of Bipolar 1 disorder (HCC); Depression; Migraine; OCD (obsessive compulsive disorder); and TMJ (dislocation of temporomandibular joint). Also,  has a past surgical history that includes None; No past surgeries; laparoscopy (N/A, 03/21/2017); and Laparoscopic lysis of adhesions (03/21/2017)., family history includes Alcohol abuse in her father; Depression in her mother; Hypertension in her maternal grandfather, maternal grandmother, paternal grandfather, and paternal grandmother; Lung cancer in her maternal grandmother; Stroke in her maternal grandfather.,  reports that she has never smoked. She has never used smokeless tobacco. She reports that she does not drink alcohol or use drugs.  She has a current medication list which includes the following prescription(s): fluvoxamine, hydrocodone-acetaminophen, ibuprofen, lamotrigine, multivitamin, and norethindrone. Also, is allergic to amoxicillin and penicillins.  ROS as noted in HPI  Physical Exam:  BP 124/72   Ht 5\' 9"  (1.753 m)   Wt 232 lb (105.2 kg)   LMP 04/25/2017   BMI 34.26 kg/m  Body mass index is 34.26 kg/m. Constitutional: Well nourished, well developed female in no acute distress.  Abdomen: diffusely non tender to palpation, non distended, and no masses, hernias Neuro: Grossly intact Psych:  Normal mood and affect.    Assessment:  Problem List Items Addressed This Visit    Endometriosis determined by laparoscopy - Primary (Chronic)   Relevant Medications   norethindrone (AYGESTIN) 5 MG tablet     Medication treatment is going well for her  currently.  Plan: She will undergo no change in her medical therapy. We determined that she will give the medication more time as she has only been taking the medication for 2 weeks currently.  Continue to monitor weight and symptoms.  Discussed that medication is not considered birth control.   She was amenable to this plan and we will see her back in 3-6 months  15 minutes spent in face to face discussion with > 50% spent in counseling and management of her endometriosis.   Thomasene MohairStephen Artur Winningham, MD  [facility], [facility] 04/29/2017  2:49 PM

## 2017-04-29 MED ORDER — NORETHINDRONE ACETATE 5 MG PO TABS: 2.5000 mg | ORAL_TABLET | Freq: Every day | ORAL | 1 refills | Status: DC

## 2017-07-02 ENCOUNTER — Encounter: Payer: Self-pay | Admitting: Obstetrics and Gynecology

## 2017-07-02 ENCOUNTER — Ambulatory Visit (INDEPENDENT_AMBULATORY_CARE_PROVIDER_SITE_OTHER): Payer: 59 | Admitting: Obstetrics and Gynecology

## 2017-07-02 VITALS — BP 114/70 | Wt 242.0 lb

## 2017-07-02 DIAGNOSIS — N809 Endometriosis, unspecified: Secondary | ICD-10-CM

## 2017-07-02 DIAGNOSIS — N941 Unspecified dyspareunia: Secondary | ICD-10-CM

## 2017-07-02 DIAGNOSIS — R102 Pelvic and perineal pain: Secondary | ICD-10-CM

## 2017-07-02 MED ORDER — NORETHINDRONE ACETATE 5 MG PO TABS: 2.5000 mg | ORAL_TABLET | Freq: Every day | ORAL | 1 refills | Status: DC

## 2017-07-02 NOTE — Progress Notes (Signed)
  History of Present Illness:  Jill [name] is a 31 y.o. who was started on norethindrone approximately 2 months ago. Since that time, she states that her symptoms are improving. Her symptom score is now 1-2/10.  Before her pain score was 4/10.  She has no side effects of the medication.  She is gaining weight, but states that that is part of an overall trend.  She is starting a new diet and exercise program to lose weight again.    PMHx: She  has a past medical history of Bipolar 1 disorder (HCC); Depression; Migraine; OCD (obsessive compulsive disorder); and TMJ (dislocation of temporomandibular joint). Also,  has a past surgical history that includes None; No past surgeries; laparoscopy (N/A, 03/21/2017); and Laparoscopic lysis of adhesions (03/21/2017)., family history includes Alcohol abuse in her father; Depression in her mother; Hypertension in her maternal grandfather, maternal grandmother, paternal grandfather, and paternal grandmother; Lung cancer in her maternal grandmother; Stroke in her maternal grandfather.,  reports that she has never smoked. She has never used smokeless tobacco. She reports that she does not drink alcohol or use drugs.  She has a current medication list which includes the following prescription(s): fluvoxamine, hydrocodone-acetaminophen, ibuprofen, lamotrigine, multivitamin, and norethindrone. Also, is allergic to amoxicillin and penicillins.  Review of Systems  Constitutional: Negative.   HENT: Negative.   Eyes: Negative.   Respiratory: Negative.   Cardiovascular: Negative.   Gastrointestinal: Negative.   Genitourinary: Negative.   Musculoskeletal: Negative.   Skin: Negative.   Neurological: Negative.   Psychiatric/Behavioral: Negative.     Physical Exam:  BP 114/70   Wt 242 lb (109.8 kg)   BMI 35.74 kg/m  Body mass index is 35.74 kg/m. Constitutional: Well nourished, well developed female in no acute distress.  Abdomen: diffusely non tender to  palpation, non distended, and no masses, hernias Neuro: Grossly intact Psych:  Normal mood and affect.    Assessment:  Problem List Items Addressed This Visit    Dyspareunia in female   Pelvic pain in female (Chronic)   Endometriosis determined by laparoscopy - Primary (Chronic)     Medication treatment is going very well for her endometriosis and pelvic pain.  Plan: She will undergo no change in her medical therapy.  She was amenable to this plan and we will see her back for annual/PRN in about 6 months  Thomasene Mohair, MD  [facility], [facility] 07/02/2017  1:45 PM

## 2017-09-01 ENCOUNTER — Encounter: Payer: Self-pay | Admitting: Obstetrics and Gynecology

## 2017-09-01 ENCOUNTER — Ambulatory Visit: Payer: 59 | Admitting: Obstetrics and Gynecology

## 2017-09-01 VITALS — BP 118/76 | Ht 69.0 in | Wt 249.0 lb

## 2017-09-01 DIAGNOSIS — N941 Unspecified dyspareunia: Secondary | ICD-10-CM | POA: Diagnosis not present

## 2017-09-01 DIAGNOSIS — N809 Endometriosis, unspecified: Secondary | ICD-10-CM | POA: Diagnosis not present

## 2017-09-01 DIAGNOSIS — Z719 Counseling, unspecified: Secondary | ICD-10-CM

## 2017-09-01 DIAGNOSIS — R102 Pelvic and perineal pain: Secondary | ICD-10-CM

## 2017-09-01 NOTE — Progress Notes (Signed)
  History of Present Illness:  Jill [name] is a 31 y.o. who was started on norethindrone approximately 4 months ago. Since that time, she states that her symptoms are improving. Her symptom score is now 1-2/10.  Before her pain score was 4/10.  She has no side effects of the medication.  She is gaining weight, but states that that is part of an overall trend.  She is starting a new diet and exercise program to lose weight again.   She presents today because she would like to stop taking the medication in order to become pregnant.  She is also taking lamictal and fluvoxamine.    PMHx: She  has a past medical history of Bipolar 1 disorder (HCC), Depression, Migraine, OCD (obsessive compulsive disorder), and TMJ (dislocation of temporomandibular joint). Also,  has a past surgical history that includes None; No past surgeries; laparoscopy (N/A, 03/21/2017); and Laparoscopic lysis of adhesions (03/21/2017)., family history includes Alcohol abuse in her father; Depression in her mother; Hypertension in her maternal grandfather, maternal grandmother, paternal grandfather, and paternal grandmother; Lung cancer in her maternal grandmother; Stroke in her maternal grandfather.,  reports that  has never smoked. she has never used smokeless tobacco. She reports that she does not drink alcohol or use drugs.  She has a current medication list which includes the following prescription(s): fluvoxamine, hydrocodone-acetaminophen, ibuprofen, lamotrigine, multivitamin, and norethindrone. Also, is allergic to amoxicillin and penicillins.  Review of Systems  Constitutional: Negative.   HENT: Negative.   Eyes: Negative.   Respiratory: Negative.   Cardiovascular: Negative.   Gastrointestinal: Negative.   Genitourinary: Negative.   Musculoskeletal: Negative.   Skin: Negative.   Neurological: Negative.   Psychiatric/Behavioral: Negative.     Physical Exam:  BP 118/76   Ht 5\' 9"  (1.753 m)   Wt 249 lb (112.9 kg)    BMI 36.77 kg/m  Body mass index is 36.77 kg/m. Constitutional: Well nourished, well developed female in no acute distress.  Abdomen: diffusely non tender to palpation, non distended, and no masses, hernias Neuro: Grossly intact Psych:  Normal mood and affect.    Assessment:  Problem List Items Addressed This Visit    Dyspareunia in female   Pelvic pain in female - Primary (Chronic)   Endometriosis determined by laparoscopy (Chronic)     Patient to stop the medication so that she can become pregnant. Discussed the use of her other medications and maximizing her health for pregnancy readiness. If she is able to stop using lamictal, then that would be ideal.  I recommend taking folic acid 4mg  daily.  For fluvoxamine, she may continue this medication, if necessary, as it has not been associated with congenital defects.  However, if she is able to tolerate stopping both medications, that would be ideal. However, if she needs the fluvoxamine, then it is probably better for her to be taking this medication. Start PNV and avoid EtOH and other harmful pregnancy substances.   15 minutes spent in face to face discussion with > 50% spent in counseling, management, and coordination of care of her endometriosis and prenatal counseling.   Thomasene MohairStephen Laren Orama, MD  [facility], [facility] 09/01/2017  4:14 PM

## 2017-09-26 ENCOUNTER — Telehealth: Payer: Self-pay

## 2017-10-02 NOTE — Telephone Encounter (Signed)
Pt calling to discuss corrections to her FMLA paperwork. [id number]#[phone]#[phone]

## 2017-10-09 ENCOUNTER — Ambulatory Visit: Payer: 59 | Admitting: Obstetrics and Gynecology

## 2018-01-13 ENCOUNTER — Encounter: Payer: Self-pay | Admitting: Obstetrics and Gynecology

## 2018-01-13 ENCOUNTER — Ambulatory Visit (INDEPENDENT_AMBULATORY_CARE_PROVIDER_SITE_OTHER): Payer: Managed Care, Other (non HMO) | Admitting: Obstetrics and Gynecology

## 2018-01-13 VITALS — BP 118/74 | Ht 69.0 in | Wt 256.0 lb

## 2018-01-13 DIAGNOSIS — O99210 Obesity complicating pregnancy, unspecified trimester: Secondary | ICD-10-CM | POA: Insufficient documentation

## 2018-01-13 DIAGNOSIS — Z3A01 Less than 8 weeks gestation of pregnancy: Secondary | ICD-10-CM

## 2018-01-13 DIAGNOSIS — Z6837 Body mass index (BMI) 37.0-37.9, adult: Secondary | ICD-10-CM

## 2018-01-13 DIAGNOSIS — F332 Major depressive disorder, recurrent severe without psychotic features: Secondary | ICD-10-CM

## 2018-01-13 DIAGNOSIS — O0990 Supervision of high risk pregnancy, unspecified, unspecified trimester: Secondary | ICD-10-CM | POA: Insufficient documentation

## 2018-01-13 DIAGNOSIS — F3181 Bipolar II disorder: Secondary | ICD-10-CM

## 2018-01-13 DIAGNOSIS — Z6841 Body Mass Index (BMI) 40.0 and over, adult: Secondary | ICD-10-CM | POA: Insufficient documentation

## 2018-01-13 DIAGNOSIS — O99211 Obesity complicating pregnancy, first trimester: Secondary | ICD-10-CM

## 2018-01-13 NOTE — Progress Notes (Signed)
New Obstetric Patient H&P   Chief Complaint: "Desires prenatal care"   History of Present Illness: Patient is a 32 y.o. G1P0000 Not Hispanic or Latino female, sure LMP 12/07/17 presents with amenorrhea and positive home pregnancy test. Based on her  LMP, her EDD is Estimated Date of Delivery: 09/13/18 and her EGA is 5016w2d. Cycles are 5. days, regular, and occur approximately every : 28 days. Her last pap smear was 1 year ago and was no abnormalities.    She had a urine pregnancy test which was positive 1 week(s)  ago. Her last menstrual period was normal and lasted for  5 day(s). Since her LMP she claims she has experienced no issues. She denies vaginal bleeding. Her past medical history is notable for bipolar/depression. This is her first pregnancy.  Since her LMP, she admits to the use of tobacco products  no She claims she has gained zeroi pounds since the start of her pregnancy.  There are cats in the home in the home  no  She admits close contact with children on a regular basis  yes  She has had chicken pox in the past yes She has had Tuberculosis exposures, symptoms, or previously tested positive for TB   no Current or past history of domestic violence. no  Genetic Screening/Teratology Counseling: (Includes patient, baby's father, or anyone in either family with:)   1. Patient's age [facility]/= 2735 [facility]  no 2. Thalassemia ([place], [place], Mediterranean, or Asian background): MCV<80  no 3. Neural tube defect (meningomyelocele, spina bifida, anencephaly)  no 4. Congenital heart defect  no  5. Down syndrome  no 6. Tay-Sachs (Jewish, [place])  no 7. Canavan's Disease  no 8. Sickle cell disease or trait (African)  no  9. Hemophilia or other blood disorders  no  10. Muscular dystrophy  no  11. Cystic fibrosis  no  12. Huntington's Chorea  no  13. Mental retardation/autism  Yes (paternal female cousin (son of father's, mother's sister). 14. Other inherited genetic or chromosomal  disorder  no 15. Maternal metabolic disorder (DM, PKU, etc)  no 16. Patient or FOB with a child with a birth defect not listed above no  16a. Patient or FOB with a birth defect themselves no 17. Recurrent pregnancy loss, or stillbirth  no  18. Any medications since LMP other than prenatal vitamins (include vitamins, supplements, OTC meds, drugs, alcohol)  zoloft and PNV. She stopped taking lamictal and fluvoxamine for 3 months in coordination with Dr. Mylinda LatinaVines.  19. Any other genetic/environmental exposure to discuss  no  Infection History:   1. Lives with someone with TB or TB exposed  no  2. Patient or partner has history of genital herpes  no 3. Rash or viral illness since LMP  no 4. History of STI (GC, CT, HPV, syphilis, HIV)  no 5. History of recent travel :  no  Other pertinent information:  no     Review of Systems:10 point review of systems negative unless otherwise noted in HPI  Past Medical History:  Diagnosis Date  . Bipolar 1 disorder (HCC)   . Depression   . Migraine   . OCD (obsessive compulsive disorder)   . TMJ (dislocation of temporomandibular joint)     Past Surgical History:  Procedure Laterality Date  . LAPAROSCOPIC LYSIS OF ADHESIONS  03/21/2017   Procedure: LAPAROSCOPIC LYSIS OF ADHESIONS;  Surgeon: Conard NovakJackson, Damoney Julia D, MD;  Location: [facility] ORS;  Service: Gynecology;;  . LAPAROSCOPY N/A 03/21/2017  Procedure: LAPAROSCOPY DIAGNOSTIC/FULGERATION OF ENDOMETRIAL IMPLANTS;  Surgeon: Conard Novak, MD;  Location: [facility] ORS;  Service: Gynecology;  Laterality: N/A;  . NO PAST SURGERIES    . None      Gynecologic History: Patient's last menstrual period was 12/07/2017.  Obstetric History: G1P0000  Family History  Problem Relation Age of Onset  . Depression Mother   . Alcohol abuse Father   . Lung cancer Maternal Grandmother   . Hypertension Maternal Grandmother   . Hypertension Maternal Grandfather   . Stroke Maternal Grandfather   . Hypertension  Paternal Grandmother   . Hypertension Paternal Grandfather     Social History   Socioeconomic History  . Marital status: Married    Spouse name: [name]  . Number of children: Not on file  . Years of education: Not on file  . Highest education level: Not on file  Occupational History  . Occupation: [employer]  Social Needs  . Financial resource strain: Not on file  . Food insecurity:    Worry: Not on file    Inability: Not on file  . Transportation needs:    Medical: Not on file    Non-medical: Not on file  Tobacco Use  . Smoking status: Never Smoker  . Smokeless tobacco: Never Used  Substance and Sexual Activity  . Alcohol use: No    Alcohol/week: 0.0 oz  . Drug use: No  . Sexual activity: Yes    Partners: Male    Birth control/protection: None  Lifestyle  . Physical activity:    Days per week: Not on file    Minutes per session: Not on file  . Stress: Not on file  Relationships  . Social connections:    Talks on phone: Not on file    Gets together: Not on file    Attends religious service: Not on file    Active member of club or organization: Not on file    Attends meetings of clubs or organizations: Not on file    Relationship status: Not on file  . Intimate partner violence:    Fear of current or ex partner: Not on file    Emotionally abused: Not on file    Physically abused: Not on file    Forced sexual activity: Not on file  Other Topics Concern  . Not on file  Social History Narrative   She is originally from [place] and graduated high school in [place]. She has been married for approximately 2years and has no children. She has been working for [employer] for the past one month. She currently lives with her husband. She denies any physical or sexual abuse.    Allergies  Allergen Reactions  . Amoxicillin Anaphylaxis, Rash and Shortness Of Breath    Childhood reaction   . Penicillins     Pt does not think that she is allergic to this medication  but is not sure     Prior to Admission medications   Medication Sig Start Date End Date Taking? Authorizing Provider  Prenatal Vit-Fe Fumarate-FA (PRENATAL VITAMIN PO) Take by mouth.   Yes [provider]  sertraline (ZOLOFT) 50 MG tablet Take 50 mg by mouth daily.   Yes [provider]    Physical Exam BP 118/74   Wt 256 lb (116.1 kg)   LMP 12/07/2017   BMI 37.80 kg/m   Physical Exam  Constitutional: She is oriented to person, place, and time. She appears well-developed and well-nourished. No distress.  HENT:  Head: Normocephalic and atraumatic.  Eyes: Conjunctivae are normal.  Neck: Normal range of motion. Neck supple. No thyromegaly present.  Cardiovascular: Normal rate, regular rhythm and normal heart sounds. Exam reveals no gallop and no friction rub.  No murmur heard. Pulmonary/Chest: Effort normal and breath sounds normal. She has no wheezes.  Abdominal: Soft. She exhibits no distension and no mass. There is no tenderness. There is no rebound and no guarding. No hernia. Hernia confirmed negative in the right inguinal area and confirmed negative in the left inguinal area.  Genitourinary: Vagina normal and uterus normal. Pelvic exam was performed with patient supine. There is no rash, tenderness or lesion on the right labia. There is no rash, tenderness or lesion on the left labia. Uterus is not deviated and not tender. Cervix exhibits no motion tenderness and no discharge. Right adnexum displays no mass, no tenderness and no fullness. Left adnexum displays no mass, no tenderness and no fullness.  Musculoskeletal: Normal range of motion.  Lymphadenopathy:    She has no cervical adenopathy.       Right: No inguinal adenopathy present.       Left: No inguinal adenopathy present.  Neurological: She is alert and oriented to person, place, and time.  Skin: Skin is warm and dry. No rash noted.  Psychiatric: She has a normal mood and affect. Her behavior is normal.  Judgment normal.    Female Chaperone present during breast and/or pelvic exam.  Baseline EPDS: 8  Assessment: 32 y.o. G1P0000 at [redacted]w[redacted]d presenting to initiate prenatal care  Plan: 1) Avoid alcoholic beverages. 2) Patient encouraged not to smoke.  3) Discontinue the use of all non-medicinal drugs and chemicals.  4) Take prenatal vitamins daily.  5) Nutrition, food safety (fish, cheese advisories, and high nitrite foods) and exercise discussed. 6) Hospital and practice style discussed with cross coverage system.  7) Genetic Screening, such as with 1st Trimester Screening, cell free fetal DNA, AFP testing, and Ultrasound, as well as with amniocentesis and CVS as appropriate, is discussed with patient. At the conclusion of today's visit patient requested genetic testing 8) Patient is asked about travel to areas at risk for the Zika virus, and counseled to avoid travel and exposure to mosquitoes or sexual partners who may have themselves been exposed to the virus. Testing is discussed, and will be ordered as appropriate.  9) Has a history of Bipolar with depression: has been off meds for 3 months. Taking zoloft. Will continue. 10) obesity: early 1h gtt.    Thomasene Mohair, MD 01/13/2018 8:52 AM

## 2018-01-14 LAB — URINE DRUG PANEL 7
AMPHETAMINES, URINE: NEGATIVE ng/mL
Barbiturate Quant, Ur: NEGATIVE ng/mL
Benzodiazepine Quant, Ur: NEGATIVE ng/mL
CANNABINOID QUANT UR: NEGATIVE ng/mL
COCAINE (METAB.): NEGATIVE ng/mL
Opiate Quant, Ur: NEGATIVE ng/mL
PCP Quant, Ur: NEGATIVE ng/mL

## 2018-01-15 LAB — URINE CULTURE

## 2018-01-15 LAB — GC/CHLAMYDIA PROBE AMP
CHLAMYDIA, DNA PROBE: NEGATIVE
NEISSERIA GONORRHOEAE BY PCR: NEGATIVE

## 2018-01-26 ENCOUNTER — Encounter: Payer: Self-pay | Admitting: Obstetrics and Gynecology

## 2018-01-26 ENCOUNTER — Ambulatory Visit (INDEPENDENT_AMBULATORY_CARE_PROVIDER_SITE_OTHER): Payer: Managed Care, Other (non HMO)

## 2018-01-26 ENCOUNTER — Other Ambulatory Visit: Payer: Managed Care, Other (non HMO)

## 2018-01-26 ENCOUNTER — Ambulatory Visit (INDEPENDENT_AMBULATORY_CARE_PROVIDER_SITE_OTHER): Payer: Managed Care, Other (non HMO) | Admitting: Obstetrics and Gynecology

## 2018-01-26 VITALS — BP 122/76 | Wt 254.0 lb

## 2018-01-26 DIAGNOSIS — F332 Major depressive disorder, recurrent severe without psychotic features: Secondary | ICD-10-CM

## 2018-01-26 DIAGNOSIS — O0990 Supervision of high risk pregnancy, unspecified, unspecified trimester: Secondary | ICD-10-CM

## 2018-01-26 DIAGNOSIS — Z6837 Body mass index (BMI) 37.0-37.9, adult: Secondary | ICD-10-CM

## 2018-01-26 DIAGNOSIS — O99211 Obesity complicating pregnancy, first trimester: Secondary | ICD-10-CM

## 2018-01-26 DIAGNOSIS — Z3A01 Less than 8 weeks gestation of pregnancy: Secondary | ICD-10-CM

## 2018-01-26 NOTE — Progress Notes (Signed)
Routine Prenatal Care Visit  Subjective  Jill [name] is a 32 y.o. G1P0000 at [redacted]w[redacted]d being seen today for ongoing prenatal care.  She is currently monitored for the following issues for this high-risk pregnancy and has Bipolar 2 disorder, major depressive episode (HCC); OCD (obsessive compulsive disorder); Insomnia due to mental disorder; Major depressive disorder, recurrent, severe without psychotic features (HCC); Dyspareunia in female; Menorrhagia with regular cycle; Dysmenorrhea; Pelvic pain in female; Endometriosis determined by laparoscopy; Supervision of high risk pregnancy, antepartum; Obesity affecting pregnancy; and BMI 37.0-37.9, adult on their problem list.  ----------------------------------------------------------------------------------- Patient reports no complaints.    . Vag. Bleeding: None.   . Denies leaking of fluid.  [place] confirms EDD.   ----------------------------------------------------------------------------------- The following portions of the patient's history were reviewed and updated as appropriate: allergies, current medications, past family history, past medical history, past social history, past surgical history and problem list. Problem list updated.  Objective  Blood pressure 122/76, weight 254 lb (115.2 kg), last menstrual period 12/07/2017. Pregravid weight 256 lb (116.1 kg) Total Weight Gain -2 lb (-0.907 kg) Urinalysis: Urine Protein: Negative Urine Glucose: Negative  Fetal Status: Fetal Heart Rate (bpm): present         General:  Alert, oriented and cooperative. Patient is in no acute distress.  Skin: Skin is warm and dry. No rash noted.   Cardiovascular: Normal heart rate noted  Respiratory: Normal respiratory effort, no problems with respiration noted  Abdomen: Soft, gravid, appropriate for gestational age. Pain/Pressure: Absent     Pelvic:  Cervical exam deferred        Extremities: Normal range of motion.     Mental Status: Normal mood and  affect. Normal behavior. Normal judgment and thought content.   US Ob Transvaginal  Result Date: 01/26/2018 ULTRASOUND REPORT Location: [facility] Date of Service: 01/26/2018 Indications:dating Findings: Mason Jim intrauterine pregnancy is visualized with a CRL consistent with [redacted]w[redacted]d gestation, giving an (U/S) EDD of 09/20/18. The (U/S) EDD is not consistent with the clinically established EDD of 09/13/18. FHR: 134 BPM CRL measurement: 4.6 mm Yolk sac is visualized and appears normal and early anatomy is normal. Right Ovary is normal in appearance. Left Ovary is normal appearance. Corpus luteal cyst:  Left ovary Survey of the adnexa demonstrates no adnexal masses. There is no free peritoneal fluid in the cul de sac. Impression: 1. [redacted]w[redacted]d Viable Singleton Intrauterine pregnancy by U/S. 2. (U/S) EDD IS NOT consistent with Clinically established EDD of 09/13/18. 3. Sub-centimeter subchorionic hemorrhage. Recommendations: 1.Clinical correlation with the patient's History and Physical Exam. 2. EDD should be adjusted to match today's ultrasound of 09/20/18. Kari Baars, RDMS There is a viable singleton gestation.  Detailed evaluation of the fetal anatomy is precluded by early gestational age.  It must be noted that a normal ultrasound particular at this early gestational age is unable to rule out fetal aneuploidy, risk of first trimester miscarriage, or anatomic birth defects. Thomasene Mohair, MD, [facility], [facility] 01/26/2018 12:41 PM    Assessment   31 y.o. G1P0000 at [redacted]w[redacted]d by  09/20/2018, by Ultrasound presenting for routine prenatal visit  Plan   pregnancy Problems (from 01/13/18 to present)    Problem Noted Resolved   Supervision of high risk pregnancy, antepartum 01/13/2018 by Conard Novak, MD No   Obesity affecting pregnancy 01/13/2018 by Conard Novak, MD No   BMI 37.0-37.9, adult 01/13/2018 by Conard Novak, MD No      Preterm labor symptoms and  general  obstetric precautions including but not limited to vaginal bleeding, contractions, leaking of fluid and fetal movement were reviewed in detail with the patient. Please refer to After Visit Summary for other counseling recommendations.   Return in about 1 month (around 02/23/2018) for Routine Prenatal Appointment.  - NIPT  Testing with carrier screening testing (basic)  Thomasene MohairStephen Lachina Salsberry, MD, [facility] Frederick[facility]ACOG [facility], [facility] 01/26/2018 6:08 PM

## 2018-01-28 LAB — RPR+RH+ABO+RUB AB+AB SCR+CB...
ANTIBODY SCREEN: NEGATIVE
HEMATOCRIT: 41 % (ref 34.0–46.6)
HEMOGLOBIN: 13.3 g/dL (ref 11.1–15.9)
HIV Screen 4th Generation wRfx: NONREACTIVE
Hepatitis B Surface Ag: NEGATIVE
MCH: 29 pg (ref 26.6–33.0)
MCHC: 32.4 g/dL (ref 31.5–35.7)
MCV: 90 fL (ref 79–97)
Platelets: 382 10*3/uL — ABNORMAL HIGH (ref 150–379)
RBC: 4.58 x10E6/uL (ref 3.77–5.28)
RDW: 13.2 % (ref 12.3–15.4)
RH TYPE: POSITIVE
RPR Ser Ql: NONREACTIVE
Rubella Antibodies, IGG: <0.9 index — ABNORMAL LOW (ref >0.99)
Varicella zoster IgG: 268 index (ref >165)
WBC: 11.6 10*3/uL — ABNORMAL HIGH (ref 3.4–10.8)

## 2018-01-28 LAB — GLUCOSE, 1 HOUR GESTATIONAL: Gestational Diabetes Screen: 85 mg/dL (ref 65–139)

## 2018-01-28 LAB — HEMOGLOBINOPATHY EVALUATION
HEMOGLOBIN F QUANTITATION: 0 % (ref 0.0–2.0)
HGB A: 97.8 % (ref 96.4–98.8)
HGB C: 0 %
HGB S: 0 %
HGB VARIANT: 0 %
Hemoglobin A2 Quantitation: 2.2 % (ref 1.8–3.2)

## 2018-01-29 ENCOUNTER — Encounter: Payer: Self-pay | Admitting: Obstetrics and Gynecology

## 2018-01-30 ENCOUNTER — Other Ambulatory Visit: Payer: Self-pay | Admitting: Maternal Newborn

## 2018-01-30 DIAGNOSIS — R110 Nausea: Principal | ICD-10-CM

## 2018-01-30 DIAGNOSIS — O26899 Other specified pregnancy related conditions, unspecified trimester: Secondary | ICD-10-CM

## 2018-01-30 MED ORDER — ONDANSETRON 4 MG PO TBDP: 4.0000 mg | ORAL_TABLET | Freq: Four times a day (QID) | ORAL | 0 refills | Status: DC | PRN

## 2018-01-30 NOTE — Progress Notes (Signed)
Sent Rx for Zofran for nausea per patient request for medication.  Marcelyn BruinsJacelyn Schmid, CNM 01/30/2018  1:28 PM

## 2018-01-30 NOTE — Telephone Encounter (Signed)
Please advise 

## 2018-02-10 ENCOUNTER — Ambulatory Visit: Payer: 59 | Admitting: Certified Nurse Midwife

## 2018-02-23 ENCOUNTER — Ambulatory Visit (INDEPENDENT_AMBULATORY_CARE_PROVIDER_SITE_OTHER): Payer: Managed Care, Other (non HMO) | Admitting: Advanced Practice Midwife

## 2018-02-23 ENCOUNTER — Encounter: Payer: Self-pay | Admitting: Advanced Practice Midwife

## 2018-02-23 VITALS — BP 126/74 | Wt 252.0 lb

## 2018-02-23 DIAGNOSIS — F319 Bipolar disorder, unspecified: Secondary | ICD-10-CM | POA: Insufficient documentation

## 2018-02-23 DIAGNOSIS — Z3A10 10 weeks gestation of pregnancy: Secondary | ICD-10-CM

## 2018-02-23 DIAGNOSIS — Z1379 Encounter for other screening for genetic and chromosomal anomalies: Secondary | ICD-10-CM

## 2018-02-23 NOTE — Progress Notes (Signed)
No vb, no lof  

## 2018-02-23 NOTE — Progress Notes (Signed)
Routine Prenatal Care Visit  Subjective  Jill [name] is a 32 y.o. G1P0000 at [redacted]w[redacted]d being seen today for ongoing prenatal care.  She is currently monitored for the following issues for this high-risk pregnancy and has Bipolar 2 disorder, major depressive episode (HCC); OCD (obsessive compulsive disorder); Insomnia due to mental disorder; Major depressive disorder, recurrent, severe without psychotic features (HCC); Dyspareunia in female; Menorrhagia with regular cycle; Dysmenorrhea; Pelvic pain in female; Endometriosis determined by laparoscopy; Supervision of high risk pregnancy, antepartum; Obesity affecting pregnancy; BMI 37.0-37.9, adult; and Bipolar 1 disorder (HCC) on their problem list.  ----------------------------------------------------------------------------------- Patient reports no complaints.    . Vag. Bleeding: None.   . Denies leaking of fluid.  ----------------------------------------------------------------------------------- The following portions of the patient's history were reviewed and updated as appropriate: allergies, current medications, past family history, past medical history, past social history, past surgical history and problem list. Problem list updated.   Objective  Blood pressure 126/74, weight 252 lb (114.3 kg), last menstrual period 12/07/2017. Pregravid weight 256 lb (116.1 kg) Total Weight Gain -4 lb (-1.814 kg) Urinalysis: Urine Protein: Negative Urine Glucose: Negative  Fetal Status:         unable to hear heart tones with doppler today  General:  Alert, oriented and cooperative. Patient is in no acute distress.  Skin: Skin is warm and dry. No rash noted.   Cardiovascular: Normal heart rate noted  Respiratory: Normal respiratory effort, no problems with respiration noted  Abdomen: Soft, gravid, appropriate for gestational age. Pain/Pressure: Absent     Pelvic:  Cervical exam deferred        Extremities: Normal range of motion.     Mental  Status: Normal mood and affect. Normal behavior. Normal judgment and thought content.   Assessment   32 y.o. G1P0000 at [redacted]w[redacted]d by  09/20/2018, by Ultrasound presenting for routine prenatal visit  Plan   pregnancy Problems (from 01/13/18 to present)    Problem Noted Resolved   Supervision of high risk pregnancy, antepartum 01/13/2018 by Conard Novak, MD No   Overview Signed 01/31/2018  5:59 PM by Conard Novak, MD    Clinic [facility] Prenatal Labs  Dating  Blood type:     Genetic Screen 1 Screen:    AFP:     Quad:     NIPS: Antibody:   Anatomic [place]  Rubella:   Varicella: @  GTT Early:               Third trimester:  RPR:     Rhogam  HBsAg:     TDaP vaccine                       Flu Shot: HIV:     Baby Food                                GBS:   Contraception  Pap:  CBB     CS/VBAC    Support Person            Obesity affecting pregnancy 01/13/2018 by Conard Novak, MD No   Overview Signed 01/31/2018  5:59 PM by Conard Novak, MD    Early 1h gtt = 85      BMI 37.0-37.9, adult 01/13/2018 by Conard Novak, MD No       Preterm labor symptoms and general obstetric precautions including but not limited to vaginal  bleeding, contractions, leaking of fluid and fetal movement were reviewed in detail with the patient. Please refer to After Visit Summary for other counseling recommendations.   Return in about 1 month (around 03/23/2018) for rob.  Tresea Mall, CNM 02/23/2018 10:48 AM

## 2018-02-23 NOTE — Patient Instructions (Signed)

## 2018-02-24 ENCOUNTER — Emergency Department: Payer: Managed Care, Other (non HMO)

## 2018-02-24 ENCOUNTER — Other Ambulatory Visit: Payer: Self-pay

## 2018-02-24 ENCOUNTER — Encounter: Payer: Self-pay | Admitting: Emergency Medicine

## 2018-02-24 ENCOUNTER — Emergency Department
Admission: EM | Admit: 2018-02-24 | Discharge: 2018-02-24 | Disposition: A | Discharge status: Home / Self Care | Payer: Managed Care, Other (non HMO) | Attending: Emergency Medicine | Admitting: Emergency Medicine

## 2018-02-24 DIAGNOSIS — Z79899 Other long term (current) drug therapy: Secondary | ICD-10-CM | POA: Insufficient documentation

## 2018-02-24 DIAGNOSIS — O0289 Other abnormal products of conception: Secondary | ICD-10-CM

## 2018-02-24 DIAGNOSIS — R109 Unspecified abdominal pain: Secondary | ICD-10-CM

## 2018-02-24 DIAGNOSIS — N939 Abnormal uterine and vaginal bleeding, unspecified: Secondary | ICD-10-CM | POA: Diagnosis not present

## 2018-02-24 LAB — CBC WITH DIFFERENTIAL/PLATELET
BASOS PCT: 0 %
Basophils Absolute: 0 10*3/uL (ref 0–0.1)
EOS PCT: 2 %
Eosinophils Absolute: 0.2 10*3/uL (ref 0–0.7)
HCT: 40.5 % (ref 35.0–47.0)
HEMOGLOBIN: 14 g/dL (ref 12.0–16.0)
Lymphocytes Relative: 31 %
Lymphs Abs: 2.9 10*3/uL (ref 1.0–3.6)
MCH: 29.5 pg (ref 26.0–34.0)
MCHC: 34.5 g/dL (ref 32.0–36.0)
MCV: 85.5 fL (ref 80.0–100.0)
MONOS PCT: 7 %
Monocytes Absolute: 0.7 10*3/uL (ref 0.2–0.9)
NEUTROS PCT: 60 %
Neutro Abs: 5.6 10*3/uL (ref 1.4–6.5)
PLATELETS: 376 10*3/uL (ref 150–440)
RBC: 4.74 MIL/uL (ref 3.80–5.20)
RDW: 12.7 % (ref 11.5–14.5)
WBC: 9.4 10*3/uL (ref 3.6–11.0)

## 2018-02-24 LAB — URINALYSIS, COMPLETE (UACMP) WITH MICROSCOPIC
BILIRUBIN URINE: NEGATIVE
GLUCOSE, UA: NEGATIVE mg/dL
KETONES UR: NEGATIVE mg/dL
LEUKOCYTES UA: NEGATIVE
Nitrite: NEGATIVE
PROTEIN: NEGATIVE mg/dL
RBC / HPF: >50 RBC/hpf — ABNORMAL HIGH (ref 0–5)
Specific Gravity, Urine: 1.01 (ref 1.005–1.030)
pH: 7 (ref 5.0–8.0)

## 2018-02-24 LAB — COMPREHENSIVE METABOLIC PANEL
ALBUMIN: 3.5 g/dL (ref 3.5–5.0)
ALK PHOS: 60 U/L (ref 38–126)
ALT: 21 U/L (ref 14–54)
ANION GAP: 8 (ref 5–15)
AST: 28 U/L (ref 15–41)
BUN: 9 mg/dL (ref 6–20)
CHLORIDE: 105 mmol/L (ref 101–111)
CO2: 24 mmol/L (ref 22–32)
Calcium: 8.8 mg/dL — ABNORMAL LOW (ref 8.9–10.3)
Creatinine, Ser: 0.52 mg/dL (ref 0.44–1.00)
GFR calc non Af Amer: >60 mL/min (ref >60)
GLUCOSE: 99 mg/dL (ref 65–99)
POTASSIUM: 3.9 mmol/L (ref 3.5–5.1)
SODIUM: 137 mmol/L (ref 135–145)
Total Bilirubin: 0.6 mg/dL (ref 0.3–1.2)
Total Protein: 7.7 g/dL (ref 6.5–8.1)

## 2018-02-24 LAB — HCG, QUANTITATIVE, PREGNANCY: hCG, Beta Chain, Quant, S: 4065 m[IU]/mL — ABNORMAL HIGH (ref <5)

## 2018-02-24 NOTE — ED Provider Notes (Signed)
[facility] Emergency Department Provider Note  ____________________________________________   I have reviewed the triage vital signs and the nursing notes.   HISTORY  Chief Complaint Vaginal Bleeding   History limited by: Not Limited   HPI Jill [name] is a 32 y.o. female who presents to the emergency department today because of concern for vaginal bleeding and abdominal cramping. She states she had some abdominal cramping yesterday. Bleeding started around 2 am this morning. The patient states she is roughly [redacted] weeks pregnant. This is her first pregnancy.    Per medical record review patient has a history of US performed roughly 1 week ago.  Past Medical History:  Diagnosis Date  . Bipolar 1 disorder (HCC)   . Depression   . Migraine   . OCD (obsessive compulsive disorder)   . TMJ (dislocation of temporomandibular joint)     Patient Active Problem List   Diagnosis Date Noted  . Bipolar 1 disorder (HCC) 02/23/2018  . Supervision of high risk pregnancy, antepartum 01/13/2018  . Obesity affecting pregnancy 01/13/2018  . BMI 37.0-37.9, adult 01/13/2018  . Endometriosis determined by laparoscopy 03/31/2017  . Pelvic pain in female 02/20/2017  . Dyspareunia in female 02/01/2017  . Menorrhagia with regular cycle 02/01/2017  . Dysmenorrhea 02/01/2017  . Major depressive disorder, recurrent, severe without psychotic features (HCC)   . OCD (obsessive compulsive disorder) 02/05/2015  . 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 Bipolar 2 disorder, major depressive episode (HCC) 02/04/2015    Past Surgical History:  Procedure Laterality Date  . LAPAROSCOPIC LYSIS OF ADHESIONS  03/21/2017   Procedure: LAPAROSCOPIC LYSIS OF ADHESIONS;  Surgeon: Conard Novak, MD;  Location: [facility] ORS;  Service: Gynecology;;  . LAPAROSCOPY N/A 03/21/2017   Procedure: LAPAROSCOPY DIAGNOSTIC/FULGERATION OF ENDOMETRIAL IMPLANTS;  Surgeon: Conard Novak, MD;   Location: [facility] ORS;  Service: Gynecology;  Laterality: N/A;  . NO PAST SURGERIES    . None      Prior to Admission medications   Medication Sig Start Date End Date Taking? Authorizing Provider  fluvoxaMINE (LUVOX) 100 MG tablet Take 100 mg by mouth at bedtime.     [provider]  lamoTRIgine (LAMICTAL) 200 MG tablet Take 200 mg by mouth at bedtime.     [provider]  Multiple Vitamin (MULTIVITAMIN) tablet Take 1 tablet by mouth daily.    [provider]  ondansetron (ZOFRAN ODT) 4 MG disintegrating tablet Take 1 tablet (4 mg total) by mouth every 6 (six) hours as needed for nausea. 01/30/18   Oswaldo Conroy, CNM  Prenatal Vit-Fe Fumarate-FA (PRENATAL VITAMIN PO) Take by mouth.    [provider]  sertraline (ZOLOFT) 50 MG tablet Take 50 mg by mouth daily.    [provider]    Allergies Amoxicillin and Penicillins  Family History  Problem Relation Age of Onset  . Depression Mother   . Alcohol abuse Father   . Lung cancer Maternal Grandmother   . Hypertension Maternal Grandmother   . Hypertension Maternal Grandfather   . Stroke Maternal Grandfather   . Hypertension Paternal Grandmother   . Hypertension Paternal Grandfather     Social History Social History   Tobacco Use  . Smoking status: Never Smoker  . Smokeless tobacco: Never Used  Substance Use Topics  . Alcohol use: No    Alcohol/week: 0.0 oz  . Drug use: No    Review of Systems Cardiovascular: Denies chest pain. Respiratory: Denies shortness of breath. Gastrointestinal:  Positive for abdominal cramping. Genitourinary: Positive for vaginal bleeding.  ____________________________________________   PHYSICAL EXAM:  VITAL SIGNS: ED Triage Vitals  Enc Vitals Group     BP 02/24/18 0356 (!) 130/91     Pulse Rate 02/24/18 0356 81     Resp 02/24/18 0356 18     Temp 02/24/18 0356 97.8 F (36.6 C)     Temp Source 02/24/18 0356 Oral     SpO2 02/24/18 0356 98 %      Weight 02/24/18 0352 250 lb (113.4 kg)     Height 02/24/18 0352  (1.753 m)     Head Circumference --      Peak Flow --      Pain Score 02/24/18 0352 6   Constitutional: Alert and oriented. Slightly anxious. Eyes: Conjunctivae are normal.  ENT   Head: Normocephalic and atraumatic.   Nose: No congestion/rhinnorhea.   Neck: No stridor. Respiratory: Normal respiratory effort without tachypnea nor retractions.  Genitourinary: Deferred Musculoskeletal: Normal range of motion in all extremities.  Neurologic:  Normal speech and language. No gross focal neurologic deficits are appreciated.  Psychiatric: Mood and affect are normal. Speech and behavior are normal. Patient exhibits appropriate insight and judgment.  ____________________________________________    LABS (pertinent positives/negatives)  bhCG 4065 CBC wnl CMP wnl except ca 8.8 UA large urine, >50 RBC, rare bacteria Per MEDICAL RECORD NUMBERAB Positive ____________________________________________   EKG  None  ____________________________________________    RADIOLOGY  [place]  single intrauterine pregnancy with no detectable heart beat  ____________________________________________   PROCEDURES  Procedures  ____________________________________________   INITIAL IMPRESSION / ASSESSMENT AND PLAN / ED COURSE  Pertinent labs & imaging results that were available during my care of the patient were reviewed by me and considered in my medical decision making (see chart for details).  Patient presented to the emergency department today because of concerns for abdominal cramping and vaginal bleeding.  Patient had ultrasound performed which is consistent with nonviable pregnancy.  Discussed this finding with the patient.  Discussed expected course of miscarriage.  Discussed bleeding return precautions. Discussed importance of following up with OB/GYN.  ____________________________________________   FINAL  CLINICAL IMPRESSION(S) / ED DIAGNOSES  Final diagnoses:  Abdominal cramping  Vaginal bleeding  Non-viable pregnancy     Note: This dictation was prepared with Dragon dictation. Any transcriptional errors that result from this process are unintentional     Phineas Semen, MD 02/24/18 [phone]

## 2018-02-24 NOTE — Discharge Instructions (Addendum)
Please seek medical attention for any high fevers, chest pain, shortness of breath, change in behavior, persistent vomiting, bloody stool or any other new or concerning symptoms.  

## 2018-02-24 NOTE — ED Triage Notes (Signed)
Patient ambulatory to triage with steady gait, without difficulty or distress noted; pt reports approx [redacted]wks pregnant with vag bleeding and cramping tonight; G1, pt at [facility] 12/15

## 2018-02-25 ENCOUNTER — Ambulatory Visit (INDEPENDENT_AMBULATORY_CARE_PROVIDER_SITE_OTHER): Payer: Managed Care, Other (non HMO) | Admitting: Maternal Newborn

## 2018-02-25 ENCOUNTER — Encounter: Payer: Self-pay | Admitting: Maternal Newborn

## 2018-02-25 VITALS — BP 120/70 | Wt 256.0 lb

## 2018-02-25 DIAGNOSIS — O039 Complete or unspecified spontaneous abortion without complication: Secondary | ICD-10-CM | POA: Diagnosis not present

## 2018-02-25 NOTE — Progress Notes (Signed)
Obstetrics & Gynecology Office Visit   Chief Complaint:  Chief Complaint  32 Patient presents with  . Follow-up    ER f/u SAB    History of Present Illness: Jill [name] began cramping and bleeding around 0200 on 5/21 and went to the emergency department at [facility]. Ultrasound performed there showed a single IUP with no cardiac activity, CRL 13.1 mm. She was advised that the ultrasound findings meet definitive criteria for failed pregnancy. She presents today with continuing cramping and bleeding and has passed some tissue. She is afebrile and has no symptoms of infection.   Review of Systems: Review of systems negative unless otherwise noted in HPI.  Past Medical History:  Past Medical History:  Diagnosis Date  . Bipolar 1 disorder (HCC)   . Depression   . Migraine   . OCD (obsessive compulsive disorder)   . TMJ (dislocation of temporomandibular joint)     Past Surgical History:  Past Surgical History:  Procedure Laterality Date  . LAPAROSCOPIC LYSIS OF ADHESIONS  03/21/2017   Procedure: LAPAROSCOPIC LYSIS OF ADHESIONS;  Surgeon: Conard Novak, MD;  Location: [facility] ORS;  Service: Gynecology;;  . LAPAROSCOPY N/A 03/21/2017   Procedure: LAPAROSCOPY DIAGNOSTIC/FULGERATION OF ENDOMETRIAL IMPLANTS;  Surgeon: Conard Novak, MD;  Location: [facility] ORS;  Service: Gynecology;  Laterality: N/A;  . NO PAST SURGERIES    . None      Gynecologic History: Patient's last menstrual period was 12/07/2017.  Obstetric History: G1P0000  Family History:  Family History  Problem Relation Age of Onset  . Depression Mother   . Alcohol abuse Father   . Lung cancer Maternal Grandmother   . Hypertension Maternal Grandmother   . Hypertension Maternal Grandfather   . Stroke Maternal Grandfather   . Hypertension Paternal Grandmother   . Hypertension Paternal Grandfather     Social History:  Social History   Socioeconomic History  . Marital status: Married    Spouse name: Jill [name]  . Number of  children: Not on file  . Years of education: Not on file  . Highest education level: Not on file  Occupational History  . Occupation: [employer]  Social Needs  . Financial resource strain: Not on file  . Food insecurity:    Worry: Not on file    Inability: Not on file  . Transportation needs:    Medical: Not on file    Non-medical: Not on file  Tobacco Use  . Smoking status: Never Smoker  . Smokeless tobacco: Never Used  Substance and Sexual Activity  . Alcohol use: No    Alcohol/week: 0.0 oz  . Drug use: No  . Sexual activity: Yes    Partners: Male    Birth control/protection: None  Lifestyle  . Physical activity:    Days per week: Not on file    Minutes per session: Not on file  . Stress: Not on file  Relationships  . Social connections:    Talks on phone: Not on file    Gets together: Not on file    Attends religious service: Not on file    Active member of club or organization: Not on file    Attends meetings of clubs or organizations: Not on file    Relationship status: Not on file  . Intimate partner violence:    Fear of current or ex partner: Not on file    Emotionally abused: Not on file    Physically abused: Not on file    Forced sexual  activity: Not on file  Other Topics Concern  . Not on file  Social History Narrative   She is originally from [place] and graduated high school in [place]. She has been married for approximately 2years and has no children. She has been working for [employer] for the past one month. She currently lives with her husband. She denies any physical or sexual abuse.    Allergies:  Allergies  Allergen Reactions  . Amoxicillin Anaphylaxis, Rash and Shortness Of Breath    Childhood reaction   . Penicillins     Pt does not think that she is allergic to this medication but is not sure     Medications: Prior to Admission medications   Medication Sig Start Date End Date Taking? Authorizing Provider  Prenatal Vit-Fe  Fumarate-FA (PRENATAL VITAMIN PO) Take by mouth.   Yes [provider]  sertraline (ZOLOFT) 50 MG tablet Take 50 mg by mouth daily.   Yes [provider]  fluvoxaMINE (LUVOX) 100 MG tablet Take 100 mg by mouth at bedtime.     [provider]  lamoTRIgine (LAMICTAL) 200 MG tablet Take 200 mg by mouth at bedtime.     [provider]  Multiple Vitamin (MULTIVITAMIN) tablet Take 1 tablet by mouth daily.    [provider]  ondansetron (ZOFRAN ODT) 4 MG disintegrating tablet Take 1 tablet (4 mg total) by mouth every 6 (six) hours as needed for nausea. Patient not taking: Reported on 02/25/2018 01/30/18   Oswaldo Conroy, CNM    Physical Exam Vitals:  Vitals:   02/25/18 1048  BP: 120/70   Patient's last menstrual period was 12/07/2017.  General: NAD HEENT: normocephalic, anicteric Pulmonary: No increased work of breathing Abdomen:  non-tender Genitourinary: deferred Neurologic: Grossly intact Psychiatric: mood appropriate, affect full  Assessment: 32 y.o. G1P0000 currently having a spontaneous miscarriage.  Plan: Problem List Items Addressed This Visit    None    Visit Diagnoses    Miscarriage    -  Primary     Condolences were offered to the patient. We discussed management options, including expectant management, medical management, and surgical management.   She elects expectant management at this time, and she was advised to contact the office should she have any follow up questions or concerns.    The patient is Rh positive and Rhogam is therefore not indicated.    A total of 15 minutes were spent in face-to-face contact with the patient during this encounter with over half of that time devoted to counseling and coordination of care.  Marcelyn Bruins, CNM 02/25/2018  12:01 PM

## 2018-02-25 NOTE — Progress Notes (Signed)
Pt went to ER yesterday with vaginal bleeding and abdominal cramping. Having an increased flow of bleeding today with cramping.

## 2018-02-26 ENCOUNTER — Encounter: Payer: Self-pay | Admitting: Maternal Newborn

## 2018-02-27 ENCOUNTER — Other Ambulatory Visit: Payer: Self-pay | Admitting: Obstetrics and Gynecology

## 2018-02-27 DIAGNOSIS — O039 Complete or unspecified spontaneous abortion without complication: Secondary | ICD-10-CM

## 2018-02-27 LAB — MATERNIT 21 PLUS CORE, BLOOD
CHROMOSOME 13: NEGATIVE
Chromosome 18: NEGATIVE
Chromosome 21: NEGATIVE
Y Chromosome: NOT DETECTED

## 2018-02-27 MED ORDER — HYDROCODONE-ACETAMINOPHEN 5-325 MG PO TABS: 1.0000 | ORAL_TABLET | ORAL | 0 refills | Status: DC | PRN

## 2018-02-27 NOTE — Telephone Encounter (Signed)
Pt states she is experiencing 8 out of 10 pain with cramping and bleeding flow consistent with heavy period. Please advise if she is able to have an rx for something other than tylenol or ibuprofen. Thank you. Please send to [facility] [address]

## 2018-03-03 ENCOUNTER — Encounter: Payer: Self-pay | Admitting: Emergency Medicine

## 2018-03-03 ENCOUNTER — Emergency Department
Admission: EM | Admit: 2018-03-03 | Discharge: 2018-03-04 | Disposition: A | Discharge status: Home / Self Care | Payer: Managed Care, Other (non HMO) | Attending: Obstetrics & Gynecology | Admitting: Obstetrics & Gynecology

## 2018-03-03 ENCOUNTER — Other Ambulatory Visit: Payer: Self-pay

## 2018-03-03 ENCOUNTER — Encounter
Admission: EM | Disposition: A | Discharge status: Home / Self Care | Payer: Self-pay | Source: Home / Self Care | Attending: Emergency Medicine

## 2018-03-03 ENCOUNTER — Emergency Department: Payer: Managed Care, Other (non HMO) | Admitting: Anesthesiology

## 2018-03-03 ENCOUNTER — Emergency Department: Payer: Managed Care, Other (non HMO)

## 2018-03-03 ENCOUNTER — Other Ambulatory Visit: Payer: Self-pay | Admitting: Obstetrics & Gynecology

## 2018-03-03 DIAGNOSIS — O021 Missed abortion: Secondary | ICD-10-CM | POA: Insufficient documentation

## 2018-03-03 DIAGNOSIS — O034 Incomplete spontaneous abortion without complication: Secondary | ICD-10-CM | POA: Diagnosis present

## 2018-03-03 DIAGNOSIS — O209 Hemorrhage in early pregnancy, unspecified: Secondary | ICD-10-CM

## 2018-03-03 DIAGNOSIS — F319 Bipolar disorder, unspecified: Secondary | ICD-10-CM | POA: Diagnosis not present

## 2018-03-03 DIAGNOSIS — F429 Obsessive-compulsive disorder, unspecified: Secondary | ICD-10-CM | POA: Diagnosis not present

## 2018-03-03 DIAGNOSIS — Z79899 Other long term (current) drug therapy: Secondary | ICD-10-CM | POA: Insufficient documentation

## 2018-03-03 HISTORY — PX: DILATION AND EVACUATION: SHX1459

## 2018-03-03 LAB — COMPREHENSIVE METABOLIC PANEL
ALT: 22 U/L (ref 14–54)
ANION GAP: 11 (ref 5–15)
AST: 29 U/L (ref 15–41)
Albumin: 3.7 g/dL (ref 3.5–5.0)
Alkaline Phosphatase: 63 U/L (ref 38–126)
BUN: 10 mg/dL (ref 6–20)
CALCIUM: 8.8 mg/dL — AB (ref 8.9–10.3)
CHLORIDE: 107 mmol/L (ref 101–111)
CO2: 22 mmol/L (ref 22–32)
Creatinine, Ser: 0.64 mg/dL (ref 0.44–1.00)
Glucose, Bld: 109 mg/dL — ABNORMAL HIGH (ref 65–99)
Potassium: 3.6 mmol/L (ref 3.5–5.1)
SODIUM: 140 mmol/L (ref 135–145)
Total Bilirubin: 0.5 mg/dL (ref 0.3–1.2)
Total Protein: 7.5 g/dL (ref 6.5–8.1)

## 2018-03-03 LAB — URINALYSIS, COMPLETE (UACMP) WITH MICROSCOPIC
Bacteria, UA: NONE SEEN
Specific Gravity, Urine: 1.028 (ref 1.005–1.030)
Squamous Epithelial / LPF: NONE SEEN (ref 0–5)

## 2018-03-03 LAB — CBC
HCT: 39 % (ref 35.0–47.0)
HEMOGLOBIN: 13.4 g/dL (ref 12.0–16.0)
MCH: 29.5 pg (ref 26.0–34.0)
MCHC: 34.5 g/dL (ref 32.0–36.0)
MCV: 85.5 fL (ref 80.0–100.0)
PLATELETS: 403 10*3/uL (ref 150–440)
RBC: 4.56 MIL/uL (ref 3.80–5.20)
RDW: 12.5 % (ref 11.5–14.5)
WBC: 14.5 10*3/uL — AB (ref 3.6–11.0)

## 2018-03-03 LAB — LIPASE, BLOOD: LIPASE: 31 U/L (ref 11–51)

## 2018-03-03 LAB — HCG, QUANTITATIVE, PREGNANCY: hCG, Beta Chain, Quant, S: 314 m[IU]/mL — ABNORMAL HIGH (ref <5)

## 2018-03-03 SURGERY — DILATION AND EVACUATION, UTERUS
Anesthesia: General | Site: Vagina | Wound class: Clean Contaminated

## 2018-03-03 MED ORDER — KETOROLAC TROMETHAMINE 30 MG/ML IJ SOLN
30.0000 mg | Freq: Four times a day (QID) | INTRAMUSCULAR | Status: DC
  Administered 2018-03-03: 30 mg via INTRAVENOUS
  Filled 2018-03-03: qty 1

## 2018-03-03 MED ORDER — IPRATROPIUM-ALBUTEROL 0.5-2.5 (3) MG/3ML IN SOLN
RESPIRATORY_TRACT | Status: AC
  Administered 2018-03-03: 3 mL via RESPIRATORY_TRACT
  Filled 2018-03-03: qty 3

## 2018-03-03 MED ORDER — FENTANYL CITRATE (PF) 100 MCG/2ML IJ SOLN
INTRAMUSCULAR | Status: AC
  Filled 2018-03-03: qty 2

## 2018-03-03 MED ORDER — IBUPROFEN 600 MG PO TABS: 600.0000 mg | ORAL_TABLET | Freq: Four times a day (QID) | ORAL | 3 refills | Status: DC | PRN

## 2018-03-03 MED ORDER — IPRATROPIUM-ALBUTEROL 0.5-2.5 (3) MG/3ML IN SOLN
3.0000 mL | Freq: Once | RESPIRATORY_TRACT | Status: AC
  Administered 2018-03-03: 3 mL via RESPIRATORY_TRACT

## 2018-03-03 MED ORDER — LACTATED RINGERS IV SOLN
INTRAVENOUS | Status: DC | PRN
  Administered 2018-03-03: 22:00:00 via INTRAVENOUS

## 2018-03-03 MED ORDER — FENTANYL CITRATE (PF) 100 MCG/2ML IJ SOLN
INTRAMUSCULAR | Status: DC | PRN
  Administered 2018-03-03: 50 ug via INTRAVENOUS

## 2018-03-03 MED ORDER — DEXAMETHASONE SODIUM PHOSPHATE 10 MG/ML IJ SOLN
INTRAMUSCULAR | Status: DC | PRN
  Administered 2018-03-03: 10 mg via INTRAVENOUS

## 2018-03-03 MED ORDER — PROPOFOL 10 MG/ML IV BOLUS
INTRAVENOUS | Status: AC
  Filled 2018-03-03: qty 20

## 2018-03-03 MED ORDER — SODIUM CHLORIDE 0.9 % IV BOLUS
1000.0000 mL | Freq: Once | INTRAVENOUS | Status: AC
  Administered 2018-03-03: 1000 mL via INTRAVENOUS

## 2018-03-03 MED ORDER — ACETAMINOPHEN 650 MG RE SUPP
650.0000 mg | RECTAL | Status: DC | PRN
  Filled 2018-03-03: qty 1

## 2018-03-03 MED ORDER — LACTATED RINGERS IV SOLN: INTRAVENOUS | Status: DC

## 2018-03-03 MED ORDER — METHYLERGONOVINE MALEATE 0.2 MG PO TABS: 0.2000 mg | ORAL_TABLET | Freq: Four times a day (QID) | ORAL | 0 refills | Status: DC

## 2018-03-03 MED ORDER — ACETAMINOPHEN 325 MG PO TABS: 650.0000 mg | ORAL_TABLET | ORAL | Status: DC | PRN

## 2018-03-03 MED ORDER — ONDANSETRON HCL 4 MG/2ML IJ SOLN
4.0000 mg | Freq: Once | INTRAMUSCULAR | Status: AC
  Administered 2018-03-03: 4 mg via INTRAVENOUS
  Filled 2018-03-03: qty 2

## 2018-03-03 MED ORDER — MORPHINE SULFATE (PF) 4 MG/ML IV SOLN
4.0000 mg | Freq: Once | INTRAVENOUS | Status: AC
  Administered 2018-03-03: 4 mg via INTRAVENOUS
  Filled 2018-03-03: qty 1

## 2018-03-03 MED ORDER — KETOROLAC TROMETHAMINE 30 MG/ML IJ SOLN
INTRAMUSCULAR | Status: AC
  Administered 2018-03-03: 30 mg via INTRAVENOUS
  Filled 2018-03-03: qty 1

## 2018-03-03 MED ORDER — ONDANSETRON HCL 4 MG/2ML IJ SOLN
INTRAMUSCULAR | Status: DC | PRN
  Administered 2018-03-03: 4 mg via INTRAVENOUS

## 2018-03-03 MED ORDER — MIDAZOLAM HCL 2 MG/2ML IJ SOLN
INTRAMUSCULAR | Status: DC | PRN
  Administered 2018-03-03: 1 mg via INTRAVENOUS

## 2018-03-03 MED ORDER — OXYTOCIN 10 UNIT/ML IJ SOLN
INTRAMUSCULAR | Status: AC
  Filled 2018-03-03: qty 1

## 2018-03-03 MED ORDER — DOXYCYCLINE HYCLATE 100 MG PO CAPS: 100.0000 mg | ORAL_CAPSULE | Freq: Two times a day (BID) | ORAL | 0 refills | Status: DC

## 2018-03-03 MED ORDER — FENTANYL CITRATE (PF) 100 MCG/2ML IJ SOLN: 25.0000 ug | INTRAMUSCULAR | Status: DC | PRN

## 2018-03-03 MED ORDER — PROPOFOL 10 MG/ML IV BOLUS
INTRAVENOUS | Status: DC | PRN
  Administered 2018-03-03: 200 mg via INTRAVENOUS

## 2018-03-03 MED ORDER — MORPHINE SULFATE (PF) 4 MG/ML IV SOLN: 1.0000 mg | INTRAVENOUS | Status: DC | PRN

## 2018-03-03 MED ORDER — ONDANSETRON HCL 4 MG/2ML IJ SOLN: 4.0000 mg | Freq: Once | INTRAMUSCULAR | Status: DC | PRN

## 2018-03-03 MED ORDER — MIDAZOLAM HCL 2 MG/2ML IJ SOLN
INTRAMUSCULAR | Status: AC
  Filled 2018-03-03: qty 2

## 2018-03-03 SURGICAL SUPPLY — 22 items
BAG COUNTER SPONGE EZ (MISCELLANEOUS) ×2 IMPLANT
CANISTER SUC SOCK COL 7IN (MISCELLANEOUS) ×3 IMPLANT
CATH ROBINSON RED A/P 16FR (CATHETERS) ×3 IMPLANT
COUNTER SPONGE BAG EZ (MISCELLANEOUS) ×1
FILTER UTR ASPR SPEC (MISCELLANEOUS) ×1 IMPLANT
FLTR UTR ASPR SPEC (MISCELLANEOUS) ×3
GLOVE BIO SURGEON STRL SZ8 (GLOVE) ×9 IMPLANT
GOWN STRL REUS W/ TWL LRG LVL3 (GOWN DISPOSABLE) ×1 IMPLANT
GOWN STRL REUS W/ TWL XL LVL3 (GOWN DISPOSABLE) ×1 IMPLANT
GOWN STRL REUS W/TWL LRG LVL3 (GOWN DISPOSABLE) ×2
GOWN STRL REUS W/TWL XL LVL3 (GOWN DISPOSABLE) ×2
KIT BERKELEY 1ST TRIMESTER 3/8 (MISCELLANEOUS) ×3 IMPLANT
KIT TURNOVER CYSTO (KITS) ×3 IMPLANT
NS IRRIG 500ML POUR BTL (IV SOLUTION) ×3 IMPLANT
PACK DNC HYST (MISCELLANEOUS) ×3 IMPLANT
PAD OB MATERNITY 4.3X12.25 (PERSONAL CARE ITEMS) ×3 IMPLANT
PAD PREP 24X41 OB/GYN DISP (PERSONAL CARE ITEMS) ×3 IMPLANT
SET BERKELEY SUCTION TUBING (SUCTIONS) ×3 IMPLANT
TOWEL OR 17X26 4PK STRL BLUE (TOWEL DISPOSABLE) ×3 IMPLANT
VACURETTE 10 RIGID CVD (CANNULA) IMPLANT
VACURETTE 12 RIGID CVD (CANNULA) IMPLANT
VACURETTE 8 RIGID CVD (CANNULA) ×3 IMPLANT

## 2018-03-03 NOTE — Transfer of Care (Signed)
Immediate Anesthesia Transfer of Care Note  Patient: Jill [name]  Procedure(s) Performed: DILATATION AND EVACUATION (N/A Vagina )  Patient Location: PACU  Anesthesia Type:General  Level of Consciousness: awake and patient cooperative  Airway & Oxygen Therapy: Patient Spontanous Breathing and Patient connected to nasal cannula oxygen  Post-op Assessment: Report given to RN and Post -op Vital signs reviewed and stable  Post vital signs: Reviewed  Last Vitals:  Vitals Value Taken Time  BP 137/85 03/03/2018 10:45 PM  Temp    Pulse 108 03/03/2018 10:47 PM  Resp 17 03/03/2018 10:47 PM  SpO2 93 % 03/03/2018 10:47 PM  Vitals shown include unvalidated device data.  Last Pain:  Vitals:   03/03/18 2131  TempSrc:   PainSc: 4          Complications: No apparent anesthesia complications

## 2018-03-03 NOTE — H&P (Signed)
Obstetrics & Gynecology History and Physical Note  Date of Consultation: 03/03/2018   Requesting Provider: [facility] ER  Primary OBGYN: [facility] Provider: Estell Harpin  Reason for Consultation: Bleeding and pain  History of Present Illness: Ms. Jill [name] is a 32 y.o. G1P0000 (Patient's last menstrual period was 12/07/2017.), with the above CC. She was diagnosed with a non-viable pregnancy one week ago and was undergoing expectant management.  She started bleeding more heavily today, with crampy abdominal pains.  No radiation, no other associated findings, no modifiers.  Beta has decreased to 314.    ROS: A review of systems was performed and was complete and comprehensive, except as stated in the above HPI.  OBGYN History: As per HPI. OB History    Gravida  1   Para  0   Term  0   Preterm  0   AB  0   Living  0     SAB  0   TAB  0   Ectopic  0   Multiple  0   Live Births  0            Past Medical History: Past Medical History:  Diagnosis Date  . Bipolar 1 disorder (HCC)   . Depression   . Migraine   . OCD (obsessive compulsive disorder)   . TMJ (dislocation of temporomandibular joint)     Past Surgical History: Past Surgical History:  Procedure Laterality Date  . LAPAROSCOPIC LYSIS OF ADHESIONS  03/21/2017   Procedure: LAPAROSCOPIC LYSIS OF ADHESIONS;  Surgeon: Conard Novak, MD;  Location: [facility] ORS;  Service: Gynecology;;  . LAPAROSCOPY N/A 03/21/2017   Procedure: LAPAROSCOPY DIAGNOSTIC/FULGERATION OF ENDOMETRIAL IMPLANTS;  Surgeon: Conard Novak, MD;  Location: [facility] ORS;  Service: Gynecology;  Laterality: N/A;  . NO PAST SURGERIES    . None      Family History:  Family History  Problem Relation Age of Onset  . Depression Mother   . Alcohol abuse Father   . Lung cancer Maternal Grandmother   . Hypertension Maternal Grandmother   . Hypertension Maternal Grandfather   . Stroke Maternal Grandfather   . Hypertension Paternal  Grandmother   . Hypertension Paternal Grandfather    She denies any female cancers, bleeding or blood clotting disorders.   Social History:  Social History   Socioeconomic History  . Marital status: Married    Spouse name: Jill [name]  . Number of children: Not on file  . Years of education: Not on file  . Highest education level: Not on file  Occupational History  . Occupation: [employer]  Social Needs  . Financial resource strain: Not on file  . Food insecurity:    Worry: Not on file    Inability: Not on file  . Transportation needs:    Medical: Not on file    Non-medical: Not on file  Tobacco Use  . Smoking status: Never Smoker  . Smokeless tobacco: Never Used  Substance and Sexual Activity  . Alcohol use: No    Alcohol/week: 0.0 oz  . Drug use: No  . Sexual activity: Yes    Partners: Male    Birth control/protection: None  Lifestyle  . Physical activity:    Days per week: Not on file    Minutes per session: Not on file  . Stress: Not on file  Relationships  . Social connections:    Talks on phone: Not on file    Gets together: Not on file  Attends religious service: Not on file    Active member of club or organization: Not on file    Attends meetings of clubs or organizations: Not on file    Relationship status: Not on file  . Intimate partner violence:    Fear of current or ex partner: Not on file    Emotionally abused: Not on file    Physically abused: Not on file    Forced sexual activity: Not on file  Other Topics Concern  . Not on file  Social History Narrative   She is originally from [place] and graduated high school in [place]. She has been married for approximately 2years and has no children. She has been working for [employer] for the past one month. She currently lives with her husband. She denies any physical or sexual abuse.    Allergy: Allergies  Allergen Reactions  . Amoxicillin Anaphylaxis, Shortness Of Breath and Rash    Childhood  reaction  Has patient had a PCN reaction causing immediate rash, facial/tongue/throat swelling, SOB or lightheadedness with hypotension: Yes Has patient had a PCN reaction causing severe rash involving mucus membranes or skin necrosis: Unknown Has patient had a PCN reaction that required hospitalization: No Has patient had a PCN reaction occurring within the last 10 years: No If all of the above answers are "NO", then may proceed with Cephalosporin use.  Marland Kitchen Penicillins     Pt does not think that she is allergic to this medication but is not sure     Current Outpatient Medications:  (Not in a hospital admission)  Hospital Medications: Current Facility-Administered Medications  Medication Dose Route Frequency Provider Last Rate Last Dose  . lactated ringers infusion   Intravenous Continuous Nadara Mustard, MD       Current Outpatient Medications  Medication Sig Dispense Refill  . ondansetron (ZOFRAN ODT) 4 MG disintegrating tablet Take 1 tablet (4 mg total) by mouth every 6 (six) hours as needed for nausea. 20 tablet 0  . sertraline (ZOLOFT) 50 MG tablet Take 50 mg by mouth daily.    Marland Kitchen doxycycline (VIBRAMYCIN) 100 MG capsule Take 1 capsule (100 mg total) by mouth 2 (two) times daily. 6 capsule 0  . HYDROcodone-acetaminophen (NORCO) 5-325 MG tablet Take 1 tablet by mouth every 4 (four) hours as needed for moderate pain. (Patient not taking: Reported on 03/03/2018) 4 tablet 0  . ibuprofen (ADVIL,MOTRIN) 600 MG tablet Take 1 tablet (600 mg total) by mouth every 6 (six) hours as needed. 60 tablet 3  . methylergonovine (METHERGINE) 0.2 MG tablet Take 1 tablet (0.2 mg total) by mouth 4 (four) times daily. For Four doses to minimize bleeding 4 tablet 0    Physical Exam: Vitals:   03/03/18 1453 03/03/18 1639 03/03/18 1700 03/03/18 1730  BP:  133/81 125/75 122/82  Pulse:  78 73 69  Resp:  12    Temp:  98 F (36.7 C)    TempSrc:  Oral    SpO2:   99% 96%  Weight: 256 lb (116.1 kg)      Height:  (1.753 m)       Temp:  [98 F (36.7 C)-98.2 F (36.8 C)] 98 F (36.7 C) (05/28 1639) Pulse Rate:  [69-78] 69 (05/28 1730) Resp:  [12-20] 12 (05/28 1639) BP: (122-133)/(52-82) 122/82 (05/28 1730) SpO2:  [96 %-100 %] 96 % (05/28 1730) Weight:  [256 lb (116.1 kg)] 256 lb (116.1 kg) (05/28 1453) No intake/output data recorded. No intake/output data recorded. No  intake or output data in the 24 hours ending 03/03/18 2101  Body mass index is 37.8 kg/m. Constitutional: Well nourished, well developed female in no acute distress.  HEENT: normal Neck:  Supple, normal appearance, and no thyromegaly  Cardiovascular:Regular rate and rhythm.  No murmurs, rubs or gallops. Respiratory:  Clear to auscultation bilateral. Normal respiratory effort Abdomen: positive bowel sounds and no masses, hernias; diffusely non tender to palpation, non distended Neuro: grossly intact Psych:  Normal mood and affect.  Skin:  Warm and dry.  MS: normal gait and normal bilateral lower extremity strength/ROM/symmetry Lymphatic:  No inguinal lymphadenopathy.   Pelvic exam: is not limited by body habitus EGBUS: within normal limits Vagina: within normal limits. Bladder and Urethra: normal. Cervix: somewhat dilated w small amount of tissue/debris at os; clots Uterus:  irregular and tender Adnexa: no mass, fullness, tenderness  Laboratory: Beta HCG: 314  Recent Labs  Lab 03/03/18 1459  WBC 14.5*  HGB 13.4  HCT 39.0  PLT 403   Recent Labs  Lab 03/03/18 1459  NA 140  K 3.6  CL 107  CO2 22  BUN 10  CREATININE 0.64  CALCIUM 8.8*  PROT 7.5  BILITOT 0.5  ALKPHOS 63  ALT 22  AST 29  GLUCOSE 109*  Rh POS  Assessment: Ms. Jill [name] is a 32 y.o. G1P0000 (Patient's last menstrual period was 12/07/2017.) who presented to the ED with complaints of bleeding and pain; findings are consistent with Incomplete Abortion.  Plan:         Condolences were offered to the patient and her family.   I stressed that while emotionally difficult, that this did not occur because of an actions or inactions by the patient.  Somewhere between 10-20% of identified first trimester pregnancies will unfortunately end in miscarriage.  Given this relatively high incidence rate, further diagnostic testing such as chromosome analysis is generally not clinically relevant nor recommended.  Although the chromosomal abnormalities have been implicated at rates as high as 70% in some studies, these are generally random and do not infer and increased risk of recurrence with subsequent pregnancies.  However, 3 or more consecutive first trimester losses are relatively uncommon, and these patient generally do benefit from additional work up to determine a potential modifiable etiology.              We briefly discussed management options including expectant management, medical management, and surgical management as well as their relative success rates and complications. Approximately 80% of first trimester miscarriages will pass successfully but may require a time frame of up to 8 weeks (ACOG Practice Bulletin 150 May 2015 "Early Pregnancy Loss").  Medical management has literature supporting its use up to 63 days or [redacted]w[redacted]d gestation and results in a passage rate of 84-85% Environmental education officer Bulletin 143 March 2014 "Medical Management of First-Trimester Abortion").  Dilation and curettage has the highest rate of uterine evacuation, but carries with is operative cost, surgical and anesthetic risk.  While these risk are relatively small they nevertheless include infection, bleeding, uterine perforation, formation of uterine synechia, and in rare cases death.              The patient elects to proceed with surgical management for her incomplete abortion. I have discussed with the patient the indications for the procedure. Included in the discussion were the options of therapy, as wall as their individual risks, benefits, and complications.  Ample time was given to answer all questions.   While the incidence  is low, the risks from this surgery include, but are not limited to, the risks of anesthesia, hemorrhage, infection, perforation, and injury to adjacent structures including bowel, bladder and blood vessels.    Annamarie Major, MD, [facility], [facility] 03/03/2018  9:01 PM Pager [phone]

## 2018-03-03 NOTE — Op Note (Signed)
  Operative Note  03/03/2018 10:29 PM  PRE-OP DIAGNOSIS: missed abortion    POST-OP DIAGNOSIS: same  SURGEON: Annamarie Major, MD, FACOG  ANESTHESIA: Choice   PROCEDURE: Procedure(s): DILATATION AND CURETTAGE   ESTIMATED BLOOD LOSS: less than 50 mL   SPECIMENS: POC   COMPLICATIONS: none  DISPOSITION: PACU - hemodynamically stable.  CONDITION: stable  FINDINGS: Exam under anesthesia revealed a 6 wk size uterus without palpable adnexal masses.   INDICATION FOR PROCEDURE: Incomplete miscarriage  PROCEDURE IN DETAIL: After informed consent was obtained, the patient was taken to the operating room where anesthesia was obtained without difficulty. The patient was positioned in the dorsal lithotomy position with [employer]. Time out was performed and an exam under anesthesia was performed. The vagina, perineum, and lower abdomen were prepped and draped in a normal sterile fashion. The bladder was emptied with an I&O catheter. A speculum was placed into the vagina and the cervix was grasped with a single toothed tenaculum. The uterus was sounded to 10cm.  The cervix was gently dilated to 20 [place] with  [employer] dilators. The suction was then tested and found to be adequate, and a 8mm rigid suction cannula was advanced into the uterine cavity. The suction was activated and the contents of the uterus were aspirated until no further tissue was obtained. The uterus was then curetted to gritty texture throughout.  At the end of the procedure bleeding was noted to be Minimal.  All instruments were then removed from the vagina.The patient tolerated the procedure well. All sponge, instrument, and needle counts were correct. The patient was taken to the recovery room in good condition.   Annamarie Major, MD, [facility], [facility] 03/03/2018  10:29 PM

## 2018-03-03 NOTE — Anesthesia Procedure Notes (Signed)
Procedure Name: LMA Insertion Date/Time: 03/03/2018 10:12 PM Performed by: Waldo Laine, CRNA Pre-anesthesia Checklist: Patient identified, Patient being monitored, Timeout performed, Emergency Drugs available and Suction available Patient Re-evaluated:Patient Re-evaluated prior to induction Oxygen Delivery Method: Circle system utilized Preoxygenation: Pre-oxygenation with 100% oxygen Induction Type: IV induction Ventilation: Mask ventilation without difficulty LMA: LMA inserted LMA Size: 4.0 Tube type: Oral Number of attempts: 1 Placement Confirmation: positive ETCO2 and breath sounds checked- equal and bilateral Tube secured with: Tape Dental Injury: Teeth and Oropharynx as per pre-operative assessment

## 2018-03-03 NOTE — Anesthesia Post-op Follow-up Note (Signed)
Anesthesia QCDR form completed.        

## 2018-03-03 NOTE — ED Provider Notes (Signed)
[facility] Emergency Department Provider Note ____________________________________________   First MD Initiated Contact with Patient 03/03/18 1706     (approximate)  I have reviewed the triage vital signs and the nursing notes.   HISTORY  Chief Complaint Abdominal Pain; Diarrhea; and Emesis    HPI Jill [name] is a 32 y.o. female at approximately [redacted] weeks gestation by dates who presents with vaginal bleeding for the last week, persistent course, acutely worsened today, with her having to change 2 pads today.  She reports associated lower abdominal crampy pain, and some nausea with vomiting.  She also had an episode of diarrhea.  She states that she feels lightheaded.  She was in the ER 1 week ago when the bleeding started, and was diagnosed with a nonviable pregnancy.  Past Medical History:  Diagnosis Date  . Bipolar 1 disorder (HCC)   . Depression   . Migraine   . OCD (obsessive compulsive disorder)   . TMJ (dislocation of temporomandibular joint)     Patient Active Problem List   Diagnosis Date Noted  . Incomplete abortion 03/03/2018  . Bipolar 1 disorder (HCC) 02/23/2018  . Obesity affecting pregnancy 01/13/2018  . BMI 37.0-37.9, adult 01/13/2018  . Endometriosis determined by laparoscopy 03/31/2017  . Pelvic pain in female 02/20/2017  . Dyspareunia in female 02/01/2017  . Menorrhagia with regular cycle 02/01/2017  . Dysmenorrhea 02/01/2017  . Major depressive disorder, recurrent, severe without psychotic features (HCC)   . OCD (obsessive compulsive disorder) 02/05/2015  . 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 Bipolar 2 disorder, major depressive episode (HCC) 02/04/2015    Past Surgical History:  Procedure Laterality Date  . LAPAROSCOPIC LYSIS OF ADHESIONS  03/21/2017   Procedure: LAPAROSCOPIC LYSIS OF ADHESIONS;  Surgeon: Conard Novak, MD;  Location: [facility] ORS;  Service: Gynecology;;  . LAPAROSCOPY N/A 03/21/2017   Procedure: LAPAROSCOPY DIAGNOSTIC/FULGERATION OF ENDOMETRIAL IMPLANTS;  Surgeon: Conard Novak, MD;  Location: [facility] ORS;  Service: Gynecology;  Laterality: N/A;  . NO PAST SURGERIES    . None      Prior to Admission medications   Medication Sig Start Date End Date Taking? Authorizing Provider  ondansetron (ZOFRAN ODT) 4 MG disintegrating tablet Take 1 tablet (4 mg total) by mouth every 6 (six) hours as needed for nausea. 01/30/18  Yes Oswaldo Conroy, CNM  sertraline (ZOLOFT) 50 MG tablet Take 50 mg by mouth daily.   Yes [provider]  doxycycline (VIBRAMYCIN) 100 MG capsule Take 1 capsule (100 mg total) by mouth 2 (two) times daily. 03/03/18   Nadara Mustard, MD  HYDROcodone-acetaminophen (NORCO) 5-325 MG tablet Take 1 tablet by mouth every 4 (four) hours as needed for moderate pain. Patient not taking: Reported on 03/03/2018 02/27/18   Natale Milch, MD  ibuprofen (ADVIL,MOTRIN) 600 MG tablet Take 1 tablet (600 mg total) by mouth every 6 (six) hours as needed. 03/03/18   Nadara Mustard, MD  methylergonovine (METHERGINE) 0.2 MG tablet Take 1 tablet (0.2 mg total) by mouth 4 (four) times daily. For Four doses to minimize bleeding 03/03/18   Nadara Mustard, MD    Allergies Amoxicillin and Penicillins  Family History  Problem Relation Age of Onset  . Depression Mother   . Alcohol abuse Father   . Lung cancer Maternal Grandmother   . Hypertension Maternal Grandmother   . Hypertension Maternal Grandfather   . Stroke Maternal Grandfather   . Hypertension Paternal Grandmother   .  Hypertension Paternal Grandfather     Social History Social History   Tobacco Use  . Smoking status: Never Smoker  . Smokeless tobacco: Never Used  Substance Use Topics  . Alcohol use: No    Alcohol/week: 0.0 oz  . Drug use: No    Review of Systems  Constitutional: No fever. Eyes: No redness. ENT: No neck pain. Cardiovascular: Denies chest pain. Respiratory: Denies  shortness of breath. Gastrointestinal: Positive for nausea and vomiting. Genitourinary: Negative for dysuria.  Positive for vaginal bleeding.   Musculoskeletal: Negative for back pain. Skin: Negative for rash. Neurological: Negative for headache.   ____________________________________________   PHYSICAL EXAM:  VITAL SIGNS: ED Triage Vitals  Enc Vitals Group     BP 03/03/18 1450 122/76     Pulse Rate 03/03/18 1450 71     Resp 03/03/18 1450 20     Temp 03/03/18 1452 98.2 F (36.8 C)     Temp Source 03/03/18 1452 Oral     SpO2 03/03/18 1450 100 %     Weight 03/03/18 1453 256 lb (116.1 kg)     Height 03/03/18 1453  (1.753 m)     Head Circumference --      Peak Flow --      Pain Score 03/03/18 1453 9     Pain Loc --      Pain Edu? --      Excl. in GC? --     Constitutional: Alert and oriented. Well appearing and in no acute distress. Eyes: Conjunctivae are normal.  Head: Atraumatic. Nose: No congestion/rhinnorhea. Mouth/Throat: Mucous membranes are moist.   Neck: Normal range of motion.  Cardiovascular:  Good peripheral circulation. Respiratory: Normal respiratory effort.   Gastrointestinal: Soft with moderate midline suprapubic tenderness. No distention.  Genitourinary: Normal external genitalia.  Moderate amount of pooling blood in the vaginal vault with some clots.   Musculoskeletal: Extremities warm and well perfused.  Neurologic:  No gross focal neurologic deficits are appreciated.  Skin:  Skin is warm and dry. No rash noted. Psychiatric: Mood and affect are normal. Speech and behavior are normal.  ____________________________________________   LABS (all labs ordered are listed, but only abnormal results are displayed)  Labs Reviewed  COMPREHENSIVE METABOLIC PANEL - Abnormal; Notable for the following components:      Result Value   Glucose, Bld 109 (*)    Calcium 8.8 (*)    All other components within normal limits  CBC - Abnormal; Notable for the  following components:   WBC 14.5 (*)    All other components within normal limits  URINALYSIS, COMPLETE (UACMP) WITH MICROSCOPIC - Abnormal; Notable for the following components:   Color, Urine RED (*)    APPearance CLOUDY (*)    Glucose, UA   (*)    Value: TEST NOT REPORTED DUE TO COLOR INTERFERENCE OF URINE PIGMENT   Hgb urine dipstick   (*)    Value: TEST NOT REPORTED DUE TO COLOR INTERFERENCE OF URINE PIGMENT   Bilirubin Urine   (*)    Value: TEST NOT REPORTED DUE TO COLOR INTERFERENCE OF URINE PIGMENT   Ketones, ur   (*)    Value: TEST NOT REPORTED DUE TO COLOR INTERFERENCE OF URINE PIGMENT   Protein, ur   (*)    Value: TEST NOT REPORTED DUE TO COLOR INTERFERENCE OF URINE PIGMENT   Nitrite   (*)    Value: TEST NOT REPORTED DUE TO COLOR INTERFERENCE OF URINE PIGMENT   Leukocytes, UA   (*)  Value: TEST NOT REPORTED DUE TO COLOR INTERFERENCE OF URINE PIGMENT   RBC / HPF >50 (*)    All other components within normal limits  HCG, QUANTITATIVE, PREGNANCY - Abnormal; Notable for the following components:   hCG, Beta Chain, Quant, S 314 (*)    All other components within normal limits  LIPASE, BLOOD  SURGICAL PATHOLOGY   ____________________________________________  EKG   ____________________________________________  RADIOLOGY  US pelvis transvaginal: Gestational sac at the cervix consistent with in progress abortion  ____________________________________________   PROCEDURES  Procedure(s) performed: No  Procedures  Critical Care performed: No ____________________________________________   INITIAL IMPRESSION / ASSESSMENT AND PLAN / ED COURSE  Pertinent labs & imaging results that were available during my care of the patient were reviewed by me and considered in my medical decision making (see chart for details).  32 year old female at approximately [redacted] weeks gestation presents with worsening vaginal bleeding lower abdominal pain today, in the context of likely  miscarriage.  I reviewed the past medical records in Epic; patient was seen in the ED 1 week ago for bleeding and was initially diagnosed with nonviable pregnancy.  On exam today, the vital signs are normal.  The patient is relatively well-appearing.  She does have a moderate amount of bleeding in the vaginal vault.  The cervical loss was difficult to palpate but seems to be slightly open.  Presentation is consistent with incomplete spontaneous abortion.  We will obtain labs, pelvic ultrasound, and GYN consult.  ----------------------------------------- 9:11 PM on 03/03/2018 -----------------------------------------  Lab work-up is relatively reassuring.  Ultrasound shows gestational sac passing through the cervix, consistent with incomplete abortion.  I consulted Dr. Tiburcio Pea from OB/GYN who has evaluated the patient and will take her for [facility]  Patient remains hemodynamically stable at this time.    ____________________________________________   FINAL CLINICAL IMPRESSION(S) / ED DIAGNOSES  Final diagnoses:  Incomplete miscarriage      NEW MEDICATIONS STARTED DURING THIS VISIT:  New Prescriptions   DOXYCYCLINE (VIBRAMYCIN) 100 MG CAPSULE    Take 1 capsule (100 mg total) by mouth 2 (two) times daily.   IBUPROFEN (ADVIL,MOTRIN) 600 MG TABLET    Take 1 tablet (600 mg total) by mouth every 6 (six) hours as needed.   METHYLERGONOVINE (METHERGINE) 0.2 MG TABLET    Take 1 tablet (0.2 mg total) by mouth 4 (four) times daily. For Four doses to minimize bleeding     Note:  This document was prepared using Dragon voice recognition software and may include unintentional dictation errors.    Dionne Bucy, MD 03/03/18 2112

## 2018-03-03 NOTE — ED Notes (Signed)
Patient transported to Ultrasound 

## 2018-03-03 NOTE — ED Triage Notes (Signed)
Says she is going through a miscarriage--started last week, and today about 10 am started having severe low mid abd pain, vomiting and diarrhea.

## 2018-03-03 NOTE — ED Notes (Signed)
Dr Tiburcio Pea at bedside. Pelvic done. Will go for D&C. Pt verbalizes understanding

## 2018-03-03 NOTE — Anesthesia Preprocedure Evaluation (Signed)
Anesthesia Evaluation  Patient identified by MRN, date of birth, ID band Patient awake    Reviewed: Allergy & Precautions, NPO status , Patient's Chart, lab work & pertinent test results, reviewed documented beta blocker date and time   Airway Mallampati: III  TM Distance: >3 FB     Dental  (+) Chipped   Pulmonary           Cardiovascular      Neuro/Psych  Headaches, PSYCHIATRIC DISORDERS Anxiety Depression Bipolar Disorder    GI/Hepatic   Endo/Other    Renal/GU      Musculoskeletal   Abdominal   Peds  Hematology   Anesthesia Other Findings Obese. No nausea now. Water at 2 pm. NPO since last nite. I think LMA would be a safe technique.  Reproductive/Obstetrics                             Anesthesia Physical Anesthesia Plan  ASA: II  Anesthesia Plan: General   Post-op Pain Management:    Induction: Intravenous  PONV Risk Score and Plan:   Airway Management Planned: LMA  Additional Equipment:   Intra-op Plan:   Post-operative Plan:   Informed Consent: I have reviewed the patients History and Physical, chart, labs and discussed the procedure including the risks, benefits and alternatives for the proposed anesthesia with the patient or authorized representative who has indicated his/her understanding and acceptance.     Plan Discussed with: CRNA  Anesthesia Plan Comments:         Anesthesia Quick Evaluation

## 2018-03-03 NOTE — ED Triage Notes (Signed)
Says light vaginal bleeding continues

## 2018-03-03 NOTE — Discharge Instructions (Signed)
Dilation and Curettage or Vacuum Curettage, Care After °This sheet gives you information about how to care for yourself after your procedure. Your health care provider may also give you more specific instructions. If you have problems or questions, contact your health care provider. °What can I expect after the procedure? °After your procedure, it is common to have: °· Mild pain or cramping. °· Some vaginal bleeding or spotting. ° °These may last for up to 2 weeks after your procedure. °Follow these instructions at home: °Activity ° °· Do not drive or use heavy machinery while taking prescription pain medicine. °· Avoid driving for the first 24 hours after your procedure. °· Take frequent, short walks, followed by rest periods, throughout the day. Ask your health care provider what activities are safe for you. After 1-2 days, you may be able to return to your normal activities. °· Do not lift anything heavier than 10 lb (4.5 kg) until your health care provider approves. °· For at least 2 weeks, or as long as told by your health care provider, do not: °? Douche. °? Use tampons. °? Have sexual intercourse. °General instructions ° °· Take over-the-counter and prescription medicines only as told by your health care provider. This is especially important if you take blood thinning medicine. °· Do not take baths, swim, or use a hot tub until your health care provider approves. Take showers instead of baths. °· Wear compression stockings as told by your health care provider. These stockings help to prevent blood clots and reduce swelling in your legs. °· It is your responsibility to get the results of your procedure. Ask your health care provider, or the department performing the procedure, when your results will be ready. °· Keep all follow-up visits as told by your health care provider. This is important. °Contact a health care provider if: °· You have severe cramps that get worse or that do not get better with  medicine. °· You have severe abdominal pain. °· You cannot drink fluids without vomiting. °· You develop pain in a different area of your pelvis. °· You have bad-smelling vaginal discharge. °· You have a rash. °Get help right away if: °· You have vaginal bleeding that soaks more than one sanitary pad in 1 hour, for 2 hours in a row. °· You pass large blood clots from your vagina. °· You have a fever that is above 100.4°F (38.0°C). °· Your abdomen feels very tender or hard. °· You have chest pain. °· You have shortness of breath. °· You cough up blood. °· You feel dizzy or light-headed. °· You faint. °· You have pain in your neck or shoulder area. °This information is not intended to replace advice given to you by your health care provider. Make sure you discuss any questions you have with your health care provider. °Document Released: 09/20/2000 Document Revised: 05/22/2016 Document Reviewed: 04/25/2016 °Elsevier Interactive Patient Education © 2018 [employer] ° ° °AMBULATORY SURGERY  °DISCHARGE INSTRUCTIONS ° ° °1) The drugs that you were given will stay in your system until tomorrow so for the next 24 hours you should not: ° °A) Drive an automobile °B) Make any legal decisions °C) Drink any alcoholic beverage ° ° °2) You may resume regular meals tomorrow.  Today it is better to start with liquids and gradually work up to solid foods. ° °You may eat anything you prefer, but it is better to start with liquids, then soup and crackers, and gradually work up to solid foods. ° ° °  3) Please notify your doctor immediately if you have any unusual bleeding, trouble breathing, redness and pain at the surgery site, drainage, fever, or pain not relieved by medication. ° ° ° °4) Additional Instructions: ° ° °Please contact your physician with any problems or Same Day Surgery at [phone], Monday through Friday 6 am to 4 pm, or [place] [facility] [place] Main number at [phone]. ° ° ° ° ° ° ° °

## 2018-03-03 NOTE — ED Notes (Signed)
Patient presents to first nurse desk reporting 30 minutes ago patient "filled up the toilet with blood". Patient reports having a miscarriage 1 week ago. Consulting civil engineer notified.

## 2018-03-04 ENCOUNTER — Encounter: Payer: Self-pay | Admitting: Obstetrics & Gynecology

## 2018-03-04 NOTE — Anesthesia Postprocedure Evaluation (Signed)
Anesthesia Post Note  Patient: Jill [name]  Procedure(s) Performed: DILATATION AND EVACUATION (N/A Vagina )  Patient location during evaluation: PACU Anesthesia Type: General Level of consciousness: awake and alert Pain management: pain level controlled Vital Signs Assessment: post-procedure vital signs reviewed and stable Respiratory status: spontaneous breathing, nonlabored ventilation, respiratory function stable and patient connected to nasal cannula oxygen Cardiovascular status: blood pressure returned to baseline and stable Postop Assessment: no apparent nausea or vomiting Anesthetic complications: no     Last Vitals:  Vitals:   03/04/18 0012 03/04/18 0028  BP: 116/74 128/77  Pulse: (!) 103 93  Resp: 17 16  Temp: 36.8 C   SpO2: 92% 95%    Last Pain:  Vitals:   03/04/18 0028  TempSrc:   PainSc: 6                  Jordanna Hendrie S

## 2018-03-04 NOTE — Care Management (Signed)
Her O2 sats have ranged between 90 and 96% on RA. Sats of 90 when asleep. Spirometry and SVN have helped. Lungs are clear, no rales or rhonchi. Sats have been in the 94 - 96% range preop. I gave her the option of being admitted overnite, or go home with spirometry instructions. She prefers the latter. She works at [employer] and is familiar with the medical world. I feel comfortable letting her go home. She is to call if she has any problems.

## 2018-03-05 LAB — SURGICAL PATHOLOGY

## 2018-03-06 LAB — INHERITEST CORE(CF97,SMA,FRAX)

## 2018-03-10 ENCOUNTER — Encounter: Payer: Self-pay | Admitting: Obstetrics & Gynecology

## 2018-03-10 ENCOUNTER — Ambulatory Visit (INDEPENDENT_AMBULATORY_CARE_PROVIDER_SITE_OTHER): Payer: Managed Care, Other (non HMO) | Admitting: Obstetrics & Gynecology

## 2018-03-10 VITALS — BP 120/80 | Ht 69.0 in | Wt 258.0 lb

## 2018-03-10 DIAGNOSIS — O034 Incomplete spontaneous abortion without complication: Secondary | ICD-10-CM

## 2018-03-10 NOTE — Progress Notes (Signed)
  [facility] Follow[facility]up [facility]  Subjective: Patient reports marked improvement in her preop symptoms. Eating a regular diet without difficulty. The patient is not having any pain.  Activity: normal activities of daily living. Patient reports vaginal sx's of spotting today, has not has normal period yet and bled little after D&C.  Objective: BP 120/80   Ht 5\' 9"  (1.753 m)   Wt 258 lb (117 kg)   BMI 38.10 kg/m  Physical Exam  Constitutional: She is oriented to person, place, and time. She appears well-developed and well-nourished. No distress.  Musculoskeletal: Normal range of motion.  Neurological: She is alert and oriented to person, place, and time.  Skin: Skin is warm and dry.  Psychiatric: She has a normal mood and affect.  Vitals reviewed.  Assessment: s/p :  D&C progressing well  Plan: Patient has done well after surgery with no apparent complications.  I have discussed the post-operative course to date, and the expected progress moving forward.  The patient understands what complications to be concerned about.  I will see the patient in routine follow up, or sooner if needed.    Activity plan: No restriction.  Plans to try for pregnancy again soon.  Recommend she waits until after her next normal menstrual period Cont PNV  Letitia LibraRobert Paul Kayleann Mccaffery 03/10/2018, 3:43 PM

## 2018-03-24 ENCOUNTER — Encounter: Payer: Managed Care, Other (non HMO) | Admitting: Obstetrics and Gynecology

## 2018-09-08 NOTE — Telephone Encounter (Signed)
Handled by FMLA personel 

## 2018-10-15 ENCOUNTER — Ambulatory Visit (INDEPENDENT_AMBULATORY_CARE_PROVIDER_SITE_OTHER): Payer: Managed Care, Other (non HMO) | Admitting: Obstetrics and Gynecology

## 2018-10-15 ENCOUNTER — Encounter: Payer: Self-pay | Admitting: Obstetrics and Gynecology

## 2018-10-15 VITALS — BP 110/80 | Ht 69.0 in | Wt 260.0 lb

## 2018-10-15 DIAGNOSIS — R234 Changes in skin texture: Secondary | ICD-10-CM

## 2018-10-15 DIAGNOSIS — N644 Mastodynia: Secondary | ICD-10-CM

## 2018-10-15 DIAGNOSIS — R102 Pelvic and perineal pain: Secondary | ICD-10-CM

## 2018-10-15 NOTE — Progress Notes (Signed)
Patient ID: Marcelyn DittyMegan Ann Brosch, female   DOB: 18-Sep-1986, 33 y.o.   MRN: [id number]  Reason for Consult: Pelvic Pain (Alot of achy breast pain that can be sharp at times, pelvic pain feels like mild cramping both x 1 month )   Referred by Estell HarpinVines, Dain, MD  Subjective:     HPI:  Marcelyn DittyMegan Ann Eskew is a 33 y.o. female . She reports breast and pelvic pain  Past Medical History:  Diagnosis Date  . Bipolar 1 disorder (HCC)   . Depression   . Migraine   . OCD (obsessive compulsive disorder)   . TMJ (dislocation of temporomandibular joint)    Family History  Problem Relation Age of Onset  . Depression Mother   . Alcohol abuse Father   . Lung cancer Maternal Grandmother   . Hypertension Maternal Grandmother   . Hypertension Maternal Grandfather   . Stroke Maternal Grandfather   . Hypertension Paternal Grandmother   . Hypertension Paternal Grandfather    Past Surgical History:  Procedure Laterality Date  . DILATION AND EVACUATION N/A 03/03/2018   Procedure: DILATATION AND EVACUATION;  Surgeon: Nadara MustardHarris, Robert P, MD;  Location: [facility];  Service: Gynecology;  Laterality: N/A;  . LAPAROSCOPIC LYSIS OF ADHESIONS  03/21/2017   Procedure: LAPAROSCOPIC LYSIS OF ADHESIONS;  Surgeon: Conard NovakJackson, Stephen D, MD;  Location: [facility];  Service: Gynecology;;  . LAPAROSCOPY N/A 03/21/2017   Procedure: LAPAROSCOPY DIAGNOSTIC/FULGERATION OF ENDOMETRIAL IMPLANTS;  Surgeon: Conard NovakJackson, Stephen D, MD;  Location: [facility];  Service: Gynecology;  Laterality: N/A;  . NO PAST SURGERIES    . None      Short Social History:  Social History   Tobacco Use  . Smoking status: Never Smoker  . Smokeless tobacco: Never Used  Substance Use Topics  . Alcohol use: No    Alcohol/week: 0.0 standard drinks    Allergies  Allergen Reactions  . Amoxicillin Anaphylaxis, Shortness Of Breath and Rash    Childhood reaction  Has patient had a PCN reaction causing immediate rash, facial/tongue/throat swelling, SOB or  lightheadedness with hypotension: Yes Has patient had a PCN reaction causing severe rash involving mucus membranes or skin necrosis: Unknown Has patient had a PCN reaction that required hospitalization: No Has patient had a PCN reaction occurring within the last 10 years: No If all of the above answers are "NO", then may proceed with Cephalosporin use.  Marland Kitchen. Penicillins     Pt does not think that she is allergic to this medication but is not sure     Current Outpatient Medications  Medication Sig Dispense Refill  . sertraline (ZOLOFT) 50 MG tablet Take 50 mg by mouth daily.    Marland Kitchen. doxycycline (VIBRAMYCIN) 100 MG capsule Take 1 capsule (100 mg total) by mouth 2 (two) times daily. (Patient not taking: Reported on 03/10/2018) 6 capsule 0  . HYDROcodone-acetaminophen (NORCO) 5-325 MG tablet Take 1 tablet by mouth every 4 (four) hours as needed for moderate pain. (Patient not taking: Reported on 03/03/2018) 4 tablet 0  . ibuprofen (ADVIL,MOTRIN) 600 MG tablet Take 1 tablet (600 mg total) by mouth every 6 (six) hours as needed. (Patient not taking: Reported on 03/10/2018) 60 tablet 3  . methylergonovine (METHERGINE) 0.2 MG tablet Take 1 tablet (0.2 mg total) by mouth 4 (four) times daily. For Four doses to minimize bleeding (Patient not taking: Reported on 03/10/2018) 4 tablet 0  . ondansetron (ZOFRAN ODT) 4 MG disintegrating tablet Take 1 tablet (4 mg total) by mouth every  6 (six) hours as needed for nausea. (Patient not taking: Reported on 03/10/2018) 20 tablet 0   No current facility-administered medications for this visit.     REVIEW OF SYSTEMS      Objective:  Objective   Vitals:   10/15/18 0924  BP: 110/80  Weight: 260 lb (117.9 kg)  Height: 5\' 9"  (1.753 m)   Body mass index is 38.4 kg/m.  Physical Exam       Assessment/Plan:     33 yo BREAST PAIN - mammogram and US ordered because of slight redness of skin in the right breast. Discussed increased NSAID usage, tight fitting sports bra,  and hot/cold compresses  Pelvic pain- hx of endometriosis, right more than left Normal discharge- nuswab sent- low risk for STD No pain on pelvic exam Pap up to date Will return for US  Adelene Idlerhristanna Terral Cooks MD [facility], [facility] 10/15/2018 10:22 AM

## 2018-10-18 LAB — NUSWAB VAGINITIS PLUS (VG+)
CHLAMYDIA TRACHOMATIS, NAA: NEGATIVE
Candida albicans, NAA: NEGATIVE
Candida glabrata, NAA: NEGATIVE
Neisseria gonorrhoeae, NAA: NEGATIVE
TRICH VAG BY NAA: NEGATIVE

## 2018-10-19 ENCOUNTER — Ambulatory Visit (INDEPENDENT_AMBULATORY_CARE_PROVIDER_SITE_OTHER): Payer: Managed Care, Other (non HMO)

## 2018-10-19 ENCOUNTER — Ambulatory Visit (INDEPENDENT_AMBULATORY_CARE_PROVIDER_SITE_OTHER): Payer: Managed Care, Other (non HMO) | Admitting: Obstetrics and Gynecology

## 2018-10-19 ENCOUNTER — Encounter: Payer: Self-pay | Admitting: Obstetrics and Gynecology

## 2018-10-19 VITALS — BP 112/80 | Ht 69.0 in | Wt 257.0 lb

## 2018-10-19 DIAGNOSIS — N644 Mastodynia: Secondary | ICD-10-CM

## 2018-10-19 DIAGNOSIS — R102 Pelvic and perineal pain: Secondary | ICD-10-CM

## 2018-10-19 DIAGNOSIS — R234 Changes in skin texture: Secondary | ICD-10-CM

## 2018-10-19 NOTE — Progress Notes (Signed)
Patient ID: Jill [name] Arpino, female   DOB: 11-30-1985, 33 y.o.   MRN: [id number]  Reason for Consult: Follow-up   Referred by Estell HarpinVines, Dain, MD  Subjective:     HPI:  Jill [name] Service is a 33 y.o. female . She is following up today for an US related to pelvic pain. She reports that her pelvic pain has been slowly improving. She also reports that her breast pain has resolved now that she has stopped drinking caffinated beverages. She additionally reports tht her and her husband are trying to conceive.   Past Medical History:  Diagnosis Date  . Bipolar 1 disorder (HCC)   . Depression   . Migraine   . OCD (obsessive compulsive disorder)   . TMJ (dislocation of temporomandibular joint)    Family History  Problem Relation Age of Onset  . Depression Mother   . Alcohol abuse Father   . Lung cancer Maternal Grandmother   . Hypertension Maternal Grandmother   . Hypertension Maternal Grandfather   . Stroke Maternal Grandfather   . Hypertension Paternal Grandmother   . Hypertension Paternal Grandfather    Past Surgical History:  Procedure Laterality Date  . DILATION AND EVACUATION N/A 03/03/2018   Procedure: DILATATION AND EVACUATION;  Surgeon: Nadara MustardHarris, Robert P, MD;  Location: [facility];  Service: Gynecology;  Laterality: N/A;  . LAPAROSCOPIC LYSIS OF ADHESIONS  03/21/2017   Procedure: LAPAROSCOPIC LYSIS OF ADHESIONS;  Surgeon: Conard NovakJackson, Stephen D, MD;  Location: [facility];  Service: Gynecology;;  . LAPAROSCOPY N/A 03/21/2017   Procedure: LAPAROSCOPY DIAGNOSTIC/FULGERATION OF ENDOMETRIAL IMPLANTS;  Surgeon: Conard NovakJackson, Stephen D, MD;  Location: [facility];  Service: Gynecology;  Laterality: N/A;  . NO PAST SURGERIES    . None      Short Social History:  Social History   Tobacco Use  . Smoking status: Never Smoker  . Smokeless tobacco: Never Used  Substance Use Topics  . Alcohol use: No    Alcohol/week: 0.0 standard drinks    Allergies  Allergen Reactions  . Amoxicillin  Anaphylaxis, Shortness Of Breath and Rash    Childhood reaction  Has patient had a PCN reaction causing immediate rash, facial/tongue/throat swelling, SOB or lightheadedness with hypotension: Yes Has patient had a PCN reaction causing severe rash involving mucus membranes or skin necrosis: Unknown Has patient had a PCN reaction that required hospitalization: No Has patient had a PCN reaction occurring within the last 10 years: No If all of the above answers are "NO", then may proceed with Cephalosporin use.  Marland Kitchen. Penicillins     Pt does not think that she is allergic to this medication but is not sure     Current Outpatient Medications  Medication Sig Dispense Refill  . sertraline (ZOLOFT) 50 MG tablet Take 50 mg by mouth daily.    Marland Kitchen. doxycycline (VIBRAMYCIN) 100 MG capsule Take 1 capsule (100 mg total) by mouth 2 (two) times daily. (Patient not taking: Reported on 03/10/2018) 6 capsule 0  . HYDROcodone-acetaminophen (NORCO) 5-325 MG tablet Take 1 tablet by mouth every 4 (four) hours as needed for moderate pain. (Patient not taking: Reported on 03/03/2018) 4 tablet 0  . ibuprofen (ADVIL,MOTRIN) 600 MG tablet Take 1 tablet (600 mg total) by mouth every 6 (six) hours as needed. (Patient not taking: Reported on 03/10/2018) 60 tablet 3  . methylergonovine (METHERGINE) 0.2 MG tablet Take 1 tablet (0.2 mg total) by mouth 4 (four) times daily. For Four doses to minimize bleeding (Patient not taking: Reported  on 03/10/2018) 4 tablet 0  . ondansetron (ZOFRAN ODT) 4 MG disintegrating tablet Take 1 tablet (4 mg total) by mouth every 6 (six) hours as needed for nausea. (Patient not taking: Reported on 03/10/2018) 20 tablet 0   No current facility-administered medications for this visit.     Review of Systems  Constitutional: Negative for chills, fatigue, fever and unexpected weight change.  HENT: Negative for trouble swallowing.  Eyes: Negative for loss of vision.  Respiratory: Negative for cough, shortness of  breath and wheezing.  Cardiovascular: Negative for chest pain, leg swelling, palpitations and syncope.  GI: Negative for abdominal pain, blood in stool, diarrhea, nausea and vomiting.  GU: Negative for difficulty urinating, dysuria, frequency and hematuria.  Musculoskeletal: Negative for back pain, leg pain and joint pain.  Skin: Negative for rash.  Neurological: Negative for dizziness, headaches, light-headedness, numbness and seizures.  Psychiatric: Negative for behavioral problem, confusion, depressed mood and sleep disturbance.        Objective:  Objective   Vitals:   10/19/18 1637  BP: 112/80  Weight: 257 lb (116.6 kg)  Height: 5\' 9"  (1.753 m)   Body mass index is 37.95 kg/m.  Physical Exam Vitals signs and nursing note reviewed.  Constitutional:      Appearance: She is well-developed.  HENT:     Head: Normocephalic and atraumatic.  Eyes:     Pupils: Pupils are equal, round, and reactive to light.  Cardiovascular:     Rate and Rhythm: Normal rate and regular rhythm.  Pulmonary:     Effort: Pulmonary effort is normal. No respiratory distress.  Skin:    General: Skin is warm and dry.  Neurological:     Mental Status: She is alert and oriented to person, place, and time.  Psychiatric:        Behavior: Behavior normal.        Thought Content: Thought content normal.        Judgment: Judgment normal.         Assessment/Plan:     33 yo with breast and pelvic pain [place] today suggests a ruptured cyst, pain improving, will continue to monitor, expectant management. Possible adenomyosis, no treatment at this time, trying to conceive Breast US and mammogram planned for Wednesday.   Advised to take PNV while attempting to conceive.   More than 15 minutes were spent face to face with the patient in the room with more than 50% of the time spent providing counseling and discussing the plan of management.    Follow up as needed.   Adelene Idler MD [facility], [facility] 10/19/2018 5:21 PM

## 2018-10-21 ENCOUNTER — Ambulatory Visit
Admission: RE | Admit: 2018-10-21 | Discharge: 2018-10-21 | Disposition: A | Discharge status: Home / Self Care | Payer: Managed Care, Other (non HMO) | Source: Ambulatory Visit | Attending: Obstetrics and Gynecology | Admitting: Obstetrics and Gynecology

## 2018-10-21 DIAGNOSIS — R234 Changes in skin texture: Secondary | ICD-10-CM | POA: Diagnosis present

## 2018-10-21 DIAGNOSIS — N644 Mastodynia: Secondary | ICD-10-CM

## 2018-10-26 NOTE — Progress Notes (Signed)
Discussed with patient on the phone. She will monitor skin for any changes like redness, dimpling, shininess, etc. And report if there are changes. Is feeling better not drinking caffeine. Adelene Idlerhristanna Schuman MD [facility], [facility] 10/26/2018 10:33 AM

## 2018-11-17 ENCOUNTER — Ambulatory Visit (INDEPENDENT_AMBULATORY_CARE_PROVIDER_SITE_OTHER): Payer: 59 | Admitting: Mental Health

## 2018-11-17 ENCOUNTER — Encounter: Payer: Self-pay | Admitting: Mental Health

## 2018-11-17 DIAGNOSIS — F319 Bipolar disorder, unspecified: Secondary | ICD-10-CM | POA: Diagnosis not present

## 2018-11-17 DIAGNOSIS — F422 Mixed obsessional thoughts and acts: Secondary | ICD-10-CM | POA: Diagnosis not present

## 2018-11-17 DIAGNOSIS — F331 Major depressive disorder, recurrent, moderate: Secondary | ICD-10-CM | POA: Diagnosis not present

## 2018-11-17 NOTE — Progress Notes (Signed)
[facility] Initial Adult Exam- Part I  Name: Jill [name] Date: 11/17/2018 MRN: [id number] DOB: 11-08-1985 PCP: Estell Harpin, MD  Time spent: 45 minutes   Guardian/Payee:patient  Paperwork requested:  No   Reason for Visit /Presenting Problem: Depression,  Management of bipolar disorder, OCD  Mental Status Exam:   Appearance:   Casual     Behavior:  Appropriate and Sharing  Motor:  Normal  Speech/Language:   Normal Rate  Affect:  Depressed  Mood:  anxious and depressed  Thought process:  normal  Thought content:    WNL  Sensory/Perceptual disturbances:    WNL  Orientation:  oriented to person, place and time/date  Attention:  Good  Concentration:  Good  Memory:  WNL  Fund of knowledge:   Good  Insight:    Good  Judgment:   Good  Impulse Control:  Problematic spending   Reported Symptoms:  Panic attacks, Sleep disturbance, Fatigue and overwhelmed and emotional  Risk Assessment: Danger to Self:  No Self-injurious Behavior: No Danger to Others: No Duty to Warn:no Physical Aggression / Violence:No  Access to Firearms a concern: No  Gang Involvement:No  Patient / guardian was educated about steps to take if suicide or homicide risk level increases between visits: no While future psychiatric events cannot be accurately predicted, the patient does not currently require acute inpatient psychiatric care and does not currently meet [facility] involuntary commitment criteria.  Substance Abuse History: Current substance abuse: No     Past Psychiatric History:   Hospitalized 4 years ago with depression Outpatient Providers:[facility] History of Psych Hospitalization: Yes  Psychological Testing: unknown    Medical History/Surgical History:reviewed Past Medical History:  Diagnosis Date  . Bipolar 1 disorder (HCC)   . Depression   . Migraine   . OCD (obsessive compulsive disorder)   . TMJ (dislocation of temporomandibular joint)     Past Surgical  History:  Procedure Laterality Date  . DILATION AND EVACUATION N/A 03/03/2018   Procedure: DILATATION AND EVACUATION;  Surgeon: Nadara Mustard, MD;  Location: [facility];  Service: Gynecology;  Laterality: N/A;  . LAPAROSCOPIC LYSIS OF ADHESIONS  03/21/2017   Procedure: LAPAROSCOPIC LYSIS OF ADHESIONS;  Surgeon: Conard Novak, MD;  Location: [facility];  Service: Gynecology;;  . LAPAROSCOPY N/A 03/21/2017   Procedure: LAPAROSCOPY DIAGNOSTIC/FULGERATION OF ENDOMETRIAL IMPLANTS;  Surgeon: Conard Novak, MD;  Location: [facility];  Service: Gynecology;  Laterality: N/A;  . NO PAST SURGERIES    . None      Medications: Current Outpatient Medications  Medication Sig Dispense Refill  . doxycycline (VIBRAMYCIN) 100 MG capsule Take 1 capsule (100 mg total) by mouth 2 (two) times daily. (Patient not taking: Reported on 03/10/2018) 6 capsule 0  . HYDROcodone-acetaminophen (NORCO) 5-325 MG tablet Take 1 tablet by mouth every 4 (four) hours as needed for moderate pain. (Patient not taking: Reported on 03/03/2018) 4 tablet 0  . ibuprofen (ADVIL,MOTRIN) 600 MG tablet Take 1 tablet (600 mg total) by mouth every 6 (six) hours as needed. (Patient not taking: Reported on 03/10/2018) 60 tablet 3  . methylergonovine (METHERGINE) 0.2 MG tablet Take 1 tablet (0.2 mg total) by mouth 4 (four) times daily. For Four doses to minimize bleeding (Patient not taking: Reported on 03/10/2018) 4 tablet 0  . ondansetron (ZOFRAN ODT) 4 MG disintegrating tablet Take 1 tablet (4 mg total) by mouth every 6 (six) hours as needed for nausea. (Patient not taking: Reported on 03/10/2018) 20 tablet 0  .  sertraline (ZOLOFT) 50 MG tablet Take 50 mg by mouth daily.     No current facility-administered medications for this visit.     Allergies  Allergen Reactions  . Amoxicillin Anaphylaxis, Shortness Of Breath and Rash    Childhood reaction  Has patient had a PCN reaction causing immediate rash, facial/tongue/throat swelling, SOB or  lightheadedness with hypotension: Yes Has patient had a PCN reaction causing severe rash involving mucus membranes or skin necrosis: Unknown Has patient had a PCN reaction that required hospitalization: No Has patient had a PCN reaction occurring within the last 10 years: No If all of the above answers are "NO", then may proceed with Cephalosporin use.  Marland Kitchen Penicillins     Pt does not think that she is allergic to this medication but is not sure     Diagnoses:    ICD-10-CM   1. Bipolar II disorder (HCC) F31.81   2. Major depressive disorder, recurrent episode, moderate (HCC) F33.1      Part II to be continued at next session:   No.   Ulice Bold, LPC   Abuse History: Victim: none Report needed: no Perpetrator of abuse: no Witness / Exposure to Domestic Violence:  none Protective Services Involvement: no Witness to [employer] Violence:  no   Family / Social History:    Living situation: in home with husband Sexual Orientation: straight Relationship Status:   Married Name of spouse / other: [name] If a parent, number of children / ages:   none  Support Systems: Family, friends  Surveyor, quantity Stress:   routine  Income/Employment/Disability:   Clinical cytogeneticist 4 years   Financial planner: none  Educational History:   Publishing copy:    unknown  Any cultural differences that may affect / interfere with treatment:  No  Recreation/Hobbies: Engineer, drilling  Stressors:  miiscarriages  Strengths:  Marriage, relationship with sister  Barriers: none  Legal History: None  Pending legal issue / charges: none  History of legal issue / charges: none   Diagnosis:  Bipolar I disorder                     Major Depression                     Obsessive Compulsive disorder

## 2018-12-08 ENCOUNTER — Ambulatory Visit: Payer: 59 | Admitting: Mental Health

## 2018-12-17 ENCOUNTER — Ambulatory Visit: Payer: 59 | Admitting: Psychiatry

## 2019-01-01 ENCOUNTER — Other Ambulatory Visit: Payer: Self-pay

## 2019-01-01 ENCOUNTER — Encounter: Payer: Self-pay | Admitting: Obstetrics and Gynecology

## 2019-01-01 ENCOUNTER — Ambulatory Visit (INDEPENDENT_AMBULATORY_CARE_PROVIDER_SITE_OTHER): Payer: Managed Care, Other (non HMO) | Admitting: Obstetrics and Gynecology

## 2019-01-01 DIAGNOSIS — Z3480 Encounter for supervision of other normal pregnancy, unspecified trimester: Secondary | ICD-10-CM | POA: Insufficient documentation

## 2019-01-01 DIAGNOSIS — N96 Recurrent pregnancy loss: Secondary | ICD-10-CM

## 2019-01-01 DIAGNOSIS — Z3A01 Less than 8 weeks gestation of pregnancy: Secondary | ICD-10-CM

## 2019-01-01 DIAGNOSIS — Z3481 Encounter for supervision of other normal pregnancy, first trimester: Secondary | ICD-10-CM

## 2019-01-01 NOTE — Progress Notes (Signed)
01/01/2019   Virtual Visit via Telephone Note  I connected with on 01/01/19 at 10:50 AM EDT by telephone and verified that I am speaking with the correct person using two identifiers.   I discussed the limitations, risks, security and privacy concerns of performing an evaluation and management service by telephone and the availability of in person appointments. I also discussed with the patient that there may be a patient responsible charge related to this service. The patient expressed understanding and agreed to proceed.  The patient was located at work I spoke with the patient from my  office The names of people involved in this encounter were: Jill [name] and Dr. Jerene Pitch.   Chief Complaint: Missed period  Transfer of Care Patient: no  History of Present Illness: Jill [name] is a 33 y.o. G2P0010 [redacted]w[redacted]d based on Patient's last menstrual period was 11/30/2018 (exact date). with an Estimated Date of Delivery: 09/06/19, with the above CC.   Her periods were: regular periods every 28 days She was using no method when she conceived.  She has Positive signs or symptoms of nausea/vomiting of pregnancy. She has Negative signs or symptoms of miscarriage or preterm labor She was not taking different medications around the time she conceived/early pregnancy. Since her LMP, she has not used alcohol Since her LMP, she has not used tobacco products Since her LMP, she has not used illegal drugs.    Viral illness with rash 2 weeks ago (around 12/15/2018)  She claims she has gained   5lbs pounds since the start of her pregnancy.   Infection History:  1. Since her LMP, she has had a viral illness.  2. She denies close contact with children on a regular basis  no  3. She has a history of chicken pox, or vaccination for chicken pox in the past. 4. Patient or partner has history of genital herpes  no 5. History of STI (GC, CT, HPV, syphilis, HIV)  no   6.  She does not live with someone with TB  or TB exposed. 7. History of recent travel :  no 8. She identifies Negative Zika risk factors for her and her partner 86. There are cats in the home in the home  No. She understands that while pregnant she should not change cat litter.   Genetic Screening Questions: (Includes patient, baby's father, or anyone in either family)   1. Patient's age >/= 86 [facility]  no 2. Thalassemia ([place], [place], Mediterranean, or Asian background): MCV<80  no 3. Neural tube defect (meningomyelocele, spina bifida, anencephaly)  no 4. Congenital heart defect  no  5. Down syndrome  no 6. Tay-Sachs (Jewish, [place]  No- [place] heritage with Husband 7. Canavan's Disease  no 8. Sickle cell disease or trait (African)  no  9. Hemophilia or other blood disorders  no  10. Muscular dystrophy  no  11. Cystic fibrosis  no  12. Huntington's Chorea  no  13. Mental retardation/autism  Yes- autism in Aunt and Cousin who is the son of the affected Aunt ( unsure if fragile x) 14. Other inherited genetic or chromosomal disorder  no 15. Maternal metabolic disorder (DM, PKU, etc)  no 16. Patient or FOB with a child with a birth defect not listed above no  16a. Patient or FOB with a birth defect themselves no 17. Recurrent pregnancy loss, or stillbirth  yes  18. Any medications since LMP other than prenatal vitamins (include vitamins, supplements, OTC meds, drugs, alcohol)  no 19. Any other genetic/environmental exposure to discuss  - working at [employer]  ROS:  Review of Systems  Constitutional: Negative for chills, fever, malaise/fatigue and weight loss.  HENT: Negative for congestion, hearing loss and sinus pain.   Eyes: Negative for blurred vision and double vision.  Respiratory: Negative for cough, sputum production, shortness of breath and wheezing.   Cardiovascular: Negative for chest pain, palpitations, orthopnea and leg swelling.  Gastrointestinal: Negative for abdominal pain, constipation,  diarrhea, nausea and vomiting.  Genitourinary: Negative for dysuria, flank pain, frequency, hematuria and urgency.  Musculoskeletal: Negative for back pain, falls and joint pain.  Skin: Negative for itching and rash.  Neurological: Negative for dizziness and headaches.  Psychiatric/Behavioral: Negative for depression, substance abuse and suicidal ideas. The patient is not nervous/anxious.     OBGYN History: As per HPI. OB History  Gravida Para Term Preterm AB Living  4 0 0 0 3 0  SAB TAB Ectopic Multiple Live Births  3 0 0 0 0    # Outcome Date GA Lbr Len/2nd Weight Sex Delivery Anes PTL Lv  4 Current           3 SAB 03/2018          2 SAB 2010          1 SAB 2005            Any issues with any prior pregnancies: not applicable Any prior children are healthy, doing well, without any problems or issues: not applicable History of pap smears: Yes. Last pap smear NIL HPV negative. Abnormal: no   History of STIs: No   Past Medical History: Past Medical History:  Diagnosis Date  . Bipolar 1 disorder (HCC)   . Depression   . Migraine   . OCD (obsessive compulsive disorder)   . TMJ (dislocation of temporomandibular joint)     Past Surgical History: Past Surgical History:  Procedure Laterality Date  . DILATION AND EVACUATION N/A 03/03/2018   Procedure: DILATATION AND EVACUATION;  Surgeon: Nadara Mustard, MD;  Location: [facility] ORS;  Service: Gynecology;  Laterality: N/A;  . LAPAROSCOPIC LYSIS OF ADHESIONS  03/21/2017   Procedure: LAPAROSCOPIC LYSIS OF ADHESIONS;  Surgeon: Conard Novak, MD;  Location: [facility] ORS;  Service: Gynecology;;  . LAPAROSCOPY N/A 03/21/2017   Procedure: LAPAROSCOPY DIAGNOSTIC/FULGERATION OF ENDOMETRIAL IMPLANTS;  Surgeon: Conard Novak, MD;  Location: [facility] ORS;  Service: Gynecology;  Laterality: N/A;  . None      Family History:  Family History  Problem Relation Age of Onset  . Depression Mother   . Alcohol abuse Father   . Lung cancer  Maternal Grandmother   . Hypertension Maternal Grandmother   . Hypertension Maternal Grandfather   . Stroke Maternal Grandfather   . Hypertension Paternal Grandmother   . Hypertension Paternal Grandfather   . Breast cancer Neg Hx    She denies any female cancers, bleeding or blood clotting disorders.  She reports any history of mental retardation, birth defects or genetic disorders in her or the FOB's history  Social History:  Social History   Socioeconomic History  . Marital status: Married    Spouse name: Jill [name]  . Number of children: Not on file  . Years of education: Not on file  . Highest education level: Not on file  Occupational History  . Occupation: [employer]  Social Needs  . Financial resource strain: Not on file  . Food insecurity:    Worry: Not on  file    Inability: Not on file  . Transportation needs:    Medical: Not on file    Non-medical: Not on file  Tobacco Use  . Smoking status: Never Smoker  . Smokeless tobacco: Never Used  Substance and Sexual Activity  . Alcohol use: No    Alcohol/week: 0.0 standard drinks  . Drug use: No  . Sexual activity: Yes    Partners: Male    Birth control/protection: None  Lifestyle  . Physical activity:    Days per week: Not on file    Minutes per session: Not on file  . Stress: Not on file  Relationships  . Social connections:    Talks on phone: Not on file    Gets together: Not on file    Attends religious service: Not on file    Active member of club or organization: Not on file    Attends meetings of clubs or organizations: Not on file    Relationship status: Not on file  . Intimate partner violence:    Fear of current or ex partner: Not on file    Emotionally abused: Not on file    Physically abused: Not on file    Forced sexual activity: Not on file  Other Topics Concern  . Not on file  Social History Narrative   She is originally from [place] and graduated high school in [place]. She has been  married for approximately 2years and has no children. She has been working for [employer] for the past one month. She currently lives with her husband. She denies any physical or sexual abuse.    Allergy: Allergies  Allergen Reactions  . Amoxicillin Anaphylaxis, Shortness Of Breath and Rash    Childhood reaction  Has patient had a PCN reaction causing immediate rash, facial/tongue/throat swelling, SOB or lightheadedness with hypotension: Yes Has patient had a PCN reaction causing severe rash involving mucus membranes or skin necrosis: Unknown Has patient had a PCN reaction that required hospitalization: No Has patient had a PCN reaction occurring within the last 10 years: No If all of the above answers are "NO", then may proceed with Cephalosporin use.  Marland Kitchen Penicillins     Pt does not think that she is allergic to this medication but is not sure     Current Outpatient Medications:  Current Outpatient Medications:  .  sertraline (ZOLOFT) 50 MG tablet, Take 50 mg by mouth daily., Disp: , Rfl:    Physical Exam: Physical Exam could not be performed. Because of the COVID-19 outbreak this visit was performed over the phone and not in person.   Assessment: Ms. Canizalez is a 33 y.o. G2P0010 [redacted]w[redacted]d based on Patient's last menstrual period was 11/30/2018 (exact date). with an Estimated Date of Delivery: 09/06/19,  for prenatal care.  Plan:  1) Avoid alcoholic beverages. 2) Patient encouraged not to smoke.  3) Discontinue the use of all non-medicinal drugs and chemicals.  4) Take prenatal vitamins daily.  5) Seatbelt use advised 6) Nutrition, food safety (fish, cheese advisories, and high nitrite foods) and exercise discussed. 7) Hospital and practice style delivering at [facility] discussed  8) Patient is asked about travel to areas at risk for the Zika virus, and counseled to avoid travel and exposure to mosquitoes or sexual partners who may have themselves been exposed to the virus. Testing is  discussed, and will be ordered as appropriate.  9) [facility]birth classes at [facility] advised 10) Genetic Screening, such as with 1st Trimester Screening, cell  free fetal DNA, AFP testing, and Ultrasound, as well as with amniocentesis and CVS as appropriate, is discussed with patient. She plans to have genetic testing this pregnancy. She would like both Maternit21 and carrier screening  Hx of recurrent miscarriages, would like to do early beta hcg and progesterone levels, consider progesterone supplementation.  Helped patient with reactivating mychart. Discussed COVID-19 precautions and modified PNC, placed letters and information in AVS.    Problem list reviewed and updated.  I discussed the assessment and treatment plan with the patient. The patient was provided an opportunity to ask questions and all were answered. The patient agreed with the plan and demonstrated an understanding of the instructions.   The patient was advised to call back or seek an in-person evaluation if the symptoms worsen or if the condition fails to improve as anticipated.  I provided 40 minutes of non-face-to-face time during this encounter.  Adelene Idlerhristanna Schuman MD [facility], [facility] 01/01/2019 12:06 PM

## 2019-01-01 NOTE — Patient Instructions (Signed)
 Hello,  Given the current COVID-19 pandemic, our practice is making changes in how we are providing care to our patients. We are limiting in-person visits for the safety of all of our patients.   As a practice, we have met to discuss the best way to minimize visits, but still provide excellent care to our expecting mothers.  We have decided on the following visit structure for low-risk pregnancies.  Initial Pregnancy visit will be conducted as a telephone or web visit.  Between 10-14 weeks  there will be one in-person visit for an ultrasound, lab work, and genetic screening. 20 weeks in-person visit with an anatomy ultrasound  28 weeks in-person office visit for a 1-hour glucose test and a TDAP vaccination 32 weeks in-person office visit 34 weeks telephone visit 36 weeks in-person office visit for GBS, chlamydia, and gonorrhea testing 38 weeks in-person office visit 40 weeks in-person office visit  Understandably, some patients will require more visits than what is outlined above. Additional visits will be determined on a case-by-case basis.   We will, as always, be available for emergencies or to address concerns that might arise between in-person visits. We ask that you allow us the opportunity to address any concerns over the phone or through a virtual visit first. We will be available to return your phone calls throughout the day.   If you are able to purchase a scale, a blood pressure machine, and a home fetal doppler visits could be limited further. This will help decrease your exposure risks, but these purchases are not a necessity.   Things seem to change daily and there is the possibility that this structure could change, please be patient as we adapt to a new way of caring for patients.   Thank you for trusting us with your prenatal care. Our practice values you and looks forward to providing you with excellent care.   Sincerely,   [facility], [facility]      COVID-19 and Your Pregnancy FAQ  How can I prevent infection with COVID-19 during my pregnancy? Social distancing is key. Please limit any interactions in public. Try and work from home if possible. Frequently wash your hands after touching possibly contaminated surfaces. Avoid touching your face.  Minimize trips to the store. Consider online ordering when possible.   Should I wear a mask? Masks should only be worn by those experiencing symptoms of COVID-19 or those with confirmed COVID-19 when they are in public or around other individuals.  What are the symptoms of COVID-19? Fever (greater than 100.4 F), dry cough, shortness of breath.  Am I more at risk for COVID-19 since I am pregnant? There is not currently data showing that pregnant women are more adversely impacted by COVID-19 than the general population. However, we know that pregnant women tend to have worse respiratory complications from similar diseases such as the flu and SARS and for this reason should be considered an at-risk population.  What do I do if I am experiencing the symptoms of COVID-19? Testing is being limited because of test availability. If you are experiencing symptoms you should quarantine yourself, and the members of your family, for at least 2 weeks at home.   Please visit this website for more information: https://www.cdc.gov/coronavirus/2019-ncov/if-you-are-sick/steps-when-sick.html  When should I go to the Emergency Room? Please go to the emergency room if you are experiencing ANY of these symptoms*:  1.    Difficulty breathing or shortness of breath 2.      Persistent pain or pressure in the chest 3.    Confusion or difficulty being aroused (or awakened) 4.    Bluish lips or face  *This list is not all inclusive. Please consult our office for any other symptoms that are severe or concerning.  What do I do if I am having difficulty breathing? You should go to the Emergency Room for  evaluation. At this time they have a tent set up for evaluating patients with COVID-19 symptoms.   How will my prenatal care be different because of the COVID-19 pandemic? It has been recommended to reduce the frequency of face-to-face visits and use resources such as telephone and virtual visits when possible. Using a scale, blood pressure machine and fetal doppler at home can further help reduce face-to-face visits. You will be provided with additional information on this topic.  We ask that you come to your visits alone to minimize potential exposures to  COVID-19.  How can I receive childbirth education? At this time in-person classes have been cancelled. You can register for online childbirth education, breastfeeding, and newborn care classes.  Please visit:  [email or web address] for more information  How will my hospital birth experience be different? The hospital is currently limiting visitors. This means that while you are in labor you can only have one person at the hospital with you. Additional family members will not be allowed to wait in the building or outside your room. Your one support person can be the father of the baby, a relative, a doula, or a friend. Once one support person is designated that person will wear a band. This band cannot be shared with multiple people.  How long will I stay in the hospital for after giving birth? It is also recommended that discharge home be expedited during the COVID-19 outbreak. This means staying for 1 day after a vaginal delivery and 2 days after a cesarean section.  What if I have COVID-19 and I am in labor? We ask that you wear a mask while on labor and delivery. We will try and accommodate you being placed in a room that is capable of filtering the air. Please call ahead if you are in labor and on your way to the hospital. The phone number for labor and delivery at [facility] is [phone].  If I have  COVID-19 when my baby is born how can I prevent my baby from contracting COVID-19? This is an issue that will have to be discussed on a case-by-case basis. Current recommendations suggest providing separate isolation rooms for both the mother and new infant as well as limiting visitors. However, there are practical challenges to this recommendation. The situation will assuredly change and decisions will be influenced by the desires of the mother and availability of space.  Some suggestions are the use of a curtain or physical barrier between mom and infant, hand hygiene, mom wearing a mask, or 6 feet of spacing between a mom and infant.   Can I breastfeed during the COVID-19 pandemic?   Yes, breastfeeding is encouraged.  Can I breastfeed if I have COVID-19? Yes. Covid-19 has not been found in breast milk. This means you cannot give COVID-19 to your child through breast milk. Breast feeding will also help pass antibodies to fight infection to your baby.   What precautions should I take when breastfeeding if I have COVID-19? If a mother and newborn do room-in and the mother wishes to feed at the breast, she should   put on a facemask and practice hand hygiene before each feeding.  What precautions should I take when pumping if I have COVID-19? Prior to expressing breast milk, mothers should practice hand hygiene. After each pumping session, all parts that come into contact with breast milk should be thoroughly washed and the entire pump should be appropriately disinfected per the manufacturer's instructions. This expressed breast milk should be fed to the newborn by a healthy caregiver.  What if I am pregnant and work in healthcare? Based on limited data regarding COVID-19 and pregnancy, ACOG currently does not propose creating additional restrictions on pregnant health care personnel because of COVID-19 alone. Pregnant women do not appear to be at higher risk of severe disease related to COVID-19.  Pregnant health care personnel should follow CDC risk assessment and infection control guidelines for health care personnel exposed to patients with suspected or confirmed COVID-19. Adherence to recommended infection prevention and control practices is an important part of protecting all health care personnel in health care settings.    Information on COVID-19 in pregnancy is very limited; however, facilities may want to consider limiting exposure of pregnant health care personnel to patients with confirmed or suspected COVID-19 infection, especially during higher-risk procedures (eg, aerosol-generating procedures), if feasible, based on staffing availability.

## 2019-01-04 ENCOUNTER — Other Ambulatory Visit: Payer: Self-pay

## 2019-01-04 ENCOUNTER — Other Ambulatory Visit: Payer: Managed Care, Other (non HMO)

## 2019-01-04 DIAGNOSIS — Z3480 Encounter for supervision of other normal pregnancy, unspecified trimester: Secondary | ICD-10-CM

## 2019-01-04 DIAGNOSIS — N96 Recurrent pregnancy loss: Secondary | ICD-10-CM

## 2019-01-05 LAB — RPR+RH+ABO+RUB AB+AB SCR+CB...
ANTIBODY SCREEN: NEGATIVE
HEP B S AG: NEGATIVE
HIV SCREEN 4TH GENERATION: NONREACTIVE
Hematocrit: 42 % (ref 34.0–46.6)
Hemoglobin: 13.8 g/dL (ref 11.1–15.9)
MCH: 27.9 pg (ref 26.6–33.0)
MCHC: 32.9 g/dL (ref 31.5–35.7)
MCV: 85 fL (ref 79–97)
PLATELETS: 412 10*3/uL (ref 150–450)
RBC: 4.94 x10E6/uL (ref 3.77–5.28)
RDW: 13.2 % (ref 11.7–15.4)
RPR: NONREACTIVE
Rh Factor: POSITIVE
Rubella Antibodies, IGG: <0.9 index — ABNORMAL LOW (ref >0.99)
Varicella zoster IgG: 328 index (ref >165)
WBC: 12 10*3/uL — ABNORMAL HIGH (ref 3.4–10.8)

## 2019-01-06 ENCOUNTER — Other Ambulatory Visit: Payer: Self-pay

## 2019-01-06 ENCOUNTER — Other Ambulatory Visit: Payer: Managed Care, Other (non HMO)

## 2019-01-06 DIAGNOSIS — Z3480 Encounter for supervision of other normal pregnancy, unspecified trimester: Secondary | ICD-10-CM

## 2019-01-06 DIAGNOSIS — N96 Recurrent pregnancy loss: Secondary | ICD-10-CM

## 2019-01-06 LAB — PROGESTERONE: Progesterone: 15.6 ng/mL

## 2019-01-06 LAB — SPECIMEN STATUS REPORT

## 2019-01-06 LAB — BETA HCG QUANT (REF LAB): hCG Quant: 3978 m[IU]/mL

## 2019-01-07 ENCOUNTER — Other Ambulatory Visit: Payer: Self-pay | Admitting: Obstetrics and Gynecology

## 2019-01-07 DIAGNOSIS — N96 Recurrent pregnancy loss: Secondary | ICD-10-CM

## 2019-01-07 LAB — BETA HCG QUANT (REF LAB): hCG Quant: 6786 m[IU]/mL

## 2019-01-07 MED ORDER — PROGESTERONE MICRONIZED 200 MG PO CAPS: 200.0000 mg | ORAL_CAPSULE | Freq: Every day | ORAL | 2 refills | Status: DC

## 2019-01-07 NOTE — Progress Notes (Signed)
Will start on progesterone for RPL.   At present there is no evidence to support the use of vaginal progesterone in women with a history of unexplained recurrent first trimester miscarriage in an attempt to minimize risk of miscarriage in a subsequent pregnancy.  Its use did not show a clinically significant increase in live birth rate.  However, there were also no identified adverse events to its use.  This data was shared with the patient.    Adelene Idler MD [facility], [facility] 01/07/2019 12:23 PM

## 2019-01-14 ENCOUNTER — Other Ambulatory Visit: Payer: Self-pay | Admitting: Certified Nurse Midwife

## 2019-01-14 MED ORDER — DOXYLAMINE-PYRIDOXINE 10-10 MG PO TBEC: DELAYED_RELEASE_TABLET | ORAL | 3 refills | Status: DC

## 2019-01-14 MED ORDER — PROMETHAZINE HCL 12.5 MG PO TABS: 12.5000 mg | ORAL_TABLET | Freq: Four times a day (QID) | ORAL | 1 refills | Status: DC | PRN

## 2019-02-10 ENCOUNTER — Ambulatory Visit (INDEPENDENT_AMBULATORY_CARE_PROVIDER_SITE_OTHER): Payer: Managed Care, Other (non HMO) | Admitting: Maternal Newborn

## 2019-02-10 ENCOUNTER — Ambulatory Visit (INDEPENDENT_AMBULATORY_CARE_PROVIDER_SITE_OTHER): Payer: Managed Care, Other (non HMO)

## 2019-02-10 ENCOUNTER — Encounter: Payer: Self-pay | Admitting: Obstetrics and Gynecology

## 2019-02-10 ENCOUNTER — Encounter: Payer: Self-pay | Admitting: Maternal Newborn

## 2019-02-10 ENCOUNTER — Other Ambulatory Visit (HOSPITAL_COMMUNITY)
Admission: RE | Admit: 2019-02-10 | Discharge: 2019-02-10 | Disposition: A | Discharge status: Home / Self Care | Payer: Managed Care, Other (non HMO) | Source: Ambulatory Visit | Attending: Obstetrics and Gynecology | Admitting: Obstetrics and Gynecology

## 2019-02-10 ENCOUNTER — Other Ambulatory Visit: Payer: Self-pay

## 2019-02-10 VITALS — BP 120/78 | HR 112 | Wt 269.0 lb

## 2019-02-10 DIAGNOSIS — N8311 Corpus luteum cyst of right ovary: Secondary | ICD-10-CM

## 2019-02-10 DIAGNOSIS — Z3480 Encounter for supervision of other normal pregnancy, unspecified trimester: Secondary | ICD-10-CM

## 2019-02-10 DIAGNOSIS — Z3A10 10 weeks gestation of pregnancy: Secondary | ICD-10-CM | POA: Diagnosis not present

## 2019-02-10 DIAGNOSIS — O3481 Maternal care for other abnormalities of pelvic organs, first trimester: Secondary | ICD-10-CM

## 2019-02-10 DIAGNOSIS — Z1379 Encounter for other screening for genetic and chromosomal anomalies: Secondary | ICD-10-CM

## 2019-02-10 DIAGNOSIS — Z3481 Encounter for supervision of other normal pregnancy, first trimester: Secondary | ICD-10-CM

## 2019-02-10 LAB — POCT URINALYSIS DIPSTICK OB: Glucose, UA: NEGATIVE

## 2019-02-10 NOTE — Progress Notes (Signed)
Released with note to [employer]

## 2019-02-10 NOTE — Progress Notes (Signed)
Dating scan/NOB EXAM today. No complaints

## 2019-02-10 NOTE — Progress Notes (Signed)
Routine Prenatal Care Visit  Subjective  Jill [name] is a 33 y.o. G4P0030 at 4019w2d being seen today for ongoing prenatal care.  She is currently monitored for the following issues for this low-risk pregnancy and has Bipolar 2 disorder, major depressive episode (HCC); OCD (obsessive compulsive disorder); Insomnia due to mental disorder; Major depressive disorder, recurrent, severe without psychotic features (HCC); Dyspareunia in female; Menorrhagia with regular cycle; Dysmenorrhea; Pelvic pain in female; Endometriosis determined by laparoscopy; BMI 37.0-37.9, adult; Bipolar 1 disorder (HCC); Incomplete abortion; and Supervision of other normal pregnancy, antepartum on their problem list.  ----------------------------------------------------------------------------------- Patient reports nausea. She is not currently taking any medication. She is able to keep down food and fluids; vomiting about two times a week.  Vag. Bleeding: None.   ----------------------------------------------------------------------------------- The following portions of the patient's history were reviewed and updated as appropriate: allergies, current medications, past family history, past medical history, past social history, past surgical history and problem list. Problem list updated.  Objective  Blood pressure 120/78, pulse (!) 112, weight 269 lb (122 kg), last menstrual period 11/30/2018. Pregravid weight 265 lb (120.2 kg) Total Weight Gain 4 lb (1.814 kg) Urinalysis: Urine dipstick shows negative for glucose, positive for protein (trace). Fetal Status: Fetal Heart Rate (bpm): Present         General:  Alert, oriented and cooperative. Patient is in no acute distress.  Skin: Skin is warm and dry. No rash noted.   Cardiovascular: Normal heart rate noted, RRR, no murmurs, rubs or gallops  Respiratory: Normal respiratory effort, no problems with respiration noted, CTAB  Abdomen: Soft, gravid, appropriate for  gestational age.       Pelvic:  Cervical exam deferred        Extremities: Normal range of motion.     Mental Status: Normal mood and affect. Normal behavior. Normal judgment and thought content.     Assessment   33 y.o. G4P0030 at 1819w2d, EDD 09/06/2019 by Last Menstrual Period presenting for a routine prenatal visit.  Plan   Pregnancy #2 Problems (from 11/30/18 to present)    Problem Noted Resolved   Supervision of other normal pregnancy, antepartum 01/01/2019 by Natale MilchSchuman, Christanna R, MD No   Overview Signed 01/01/2019 12:06 PM by Natale MilchSchuman, Christanna R, MD      Clinic [facility] Prenatal [facility]  Dating  Blood type: AB/Positive/-- (04/22 1104)   Genetic Screen NIPS: desired CF/SMA/X:  desired   Antibody:Negative (04/22 1104)  Anatomic US  Rubella: <0.90 (04/22 1104) Varicella: @VZVIGG @  GTT Early:        28 wk:      RPR: Non Reactive (04/22 1104)   Rhogam  HBsAg: Negative (04/22 1104)   TDaP vaccine                       HIV: Non Reactive (04/22 1104)   Flu Shot                                GBS:   Contraception  Pap:  CBB     CS/VBAC    Baby Food    Support Person              ED visit 4/15 with pain and with dark brown spotting; reports that ultrasound done then showed an ovarian cyst. Pain has subsided.  Ultrasound today shows singleton IUP, size=dates, FHR 185  MaterniT21 and Inheritest today. .Marland Kitchen  Please refer to After Visit Summary for other counseling recommendations.   Return in about 4 weeks (around 03/10/2019) for ROB/telephone.  Jill Bruins, CNM 02/10/2019

## 2019-02-11 LAB — URINE DRUG PANEL 7
Amphetamines, Urine: NEGATIVE ng/mL
Barbiturate Quant, Ur: NEGATIVE ng/mL
Benzodiazepine Quant, Ur: NEGATIVE ng/mL
Cannabinoid Quant, Ur: NEGATIVE ng/mL
Cocaine (Metab.): NEGATIVE ng/mL
Opiate Quant, Ur: NEGATIVE ng/mL
PCP Quant, Ur: NEGATIVE ng/mL

## 2019-02-11 LAB — URINE CYTOLOGY ANCILLARY ONLY
Chlamydia: NEGATIVE
Neisseria Gonorrhea: NEGATIVE

## 2019-02-11 NOTE — Patient Instructions (Signed)
First Trimester of Pregnancy  The first trimester of pregnancy is from week 1 until the end of week 13 (months 1 through 3). A week after a sperm fertilizes an egg, the egg will implant on the wall of the uterus. This embryo will begin to develop into a baby. Genes from you and your partner will form the baby. The female genes will determine whether the baby will be a boy or a girl. At 6-8 weeks, the eyes and face will be formed, and the heartbeat can be seen on ultrasound. At the end of 12 weeks, all the baby's organs will be formed.  Now that you are pregnant, you will want to do everything you can to have a healthy baby. Two of the most important things are to get good prenatal care and to follow your health care provider's instructions. Prenatal care is all the medical care you receive before the baby's birth. This care will help prevent, find, and treat any problems during the pregnancy and childbirth.  Body changes during your first trimester  Your body goes through many changes during pregnancy. The changes vary from woman to woman.   You may gain or lose a couple of pounds at first.   You may feel sick to your stomach (nauseous) and you may throw up (vomit). If the vomiting is uncontrollable, call your health care provider.   You may tire easily.   You may develop headaches that can be relieved by medicines. All medicines should be approved by your health care provider.   You may urinate more often. Painful urination may mean you have a bladder infection.   You may develop heartburn as a result of your pregnancy.   You may develop constipation because certain hormones are causing the muscles that push stool through your intestines to slow down.   You may develop hemorrhoids or swollen veins (varicose veins).   Your breasts may begin to grow larger and become tender. Your nipples may stick out more, and the tissue that surrounds them (areola) may become darker.   Your gums may bleed and may be  sensitive to brushing and flossing.   Dark spots or blotches (chloasma, mask of pregnancy) may develop on your face. This will likely fade after the baby is born.   Your menstrual periods will stop.   You may have a loss of appetite.   You may develop cravings for certain kinds of food.   You may have changes in your emotions from day to day, such as being excited to be pregnant or being concerned that something may go wrong with the pregnancy and baby.   You may have more vivid and strange dreams.   You may have changes in your hair. These can include thickening of your hair, rapid growth, and changes in texture. Some women also have hair loss during or after pregnancy, or hair that feels dry or thin. Your hair will most likely return to normal after your baby is born.  What to expect at prenatal visits  During a routine prenatal visit:   You will be weighed to make sure you and the baby are growing normally.   Your blood pressure will be taken.   Your abdomen will be measured to track your baby's growth.   The fetal heartbeat will be listened to between weeks 10 and 14 of your pregnancy.   Test results from any previous visits will be discussed.  Your health care provider may ask you:     How you are feeling.   If you are feeling the baby move.   If you have had any abnormal symptoms, such as leaking fluid, bleeding, severe headaches, or abdominal cramping.   If you are using any tobacco products, including cigarettes, chewing tobacco, and electronic cigarettes.   If you have any questions.  Other tests that may be performed during your first trimester include:   Blood tests to find your blood type and to check for the presence of any previous infections. The tests will also be used to check for low iron levels (anemia) and protein on red blood cells (Rh antibodies). Depending on your risk factors, or if you previously had diabetes during pregnancy, you may have tests to check for high blood sugar  that affects pregnant women (gestational diabetes).   Urine tests to check for infections, diabetes, or protein in the urine.   An ultrasound to confirm the proper growth and development of the baby.   Fetal screens for spinal cord problems (spina bifida) and Down syndrome.   HIV (human immunodeficiency virus) testing. Routine prenatal testing includes screening for HIV, unless you choose not to have this test.   You may need other tests to make sure you and the baby are doing well.  Follow these instructions at home:  Medicines   Follow your health care provider's instructions regarding medicine use. Specific medicines may be either safe or unsafe to take during pregnancy.   Take a prenatal vitamin that contains at least 600 micrograms (mcg) of folic acid.   If you develop constipation, try taking a stool softener if your health care provider approves.  Eating and drinking     Eat a balanced diet that includes fresh fruits and vegetables, whole grains, good sources of protein such as meat, eggs, or tofu, and low-fat dairy. Your health care provider will help you determine the amount of weight gain that is right for you.   Avoid raw meat and uncooked cheese. These carry germs that can cause birth defects in the baby.   Eating four or five small meals rather than three large meals a day may help relieve nausea and vomiting. If you start to feel nauseous, eating a few soda crackers can be helpful. Drinking liquids between meals, instead of during meals, also seems to help ease nausea and vomiting.   Limit foods that are high in fat and processed sugars, such as fried and sweet foods.   To prevent constipation:  ? Eat foods that are high in fiber, such as fresh fruits and vegetables, whole grains, and beans.  ? Drink enough fluid to keep your urine clear or pale yellow.  Activity   Exercise only as directed by your health care provider. Most women can continue their usual exercise routine during  pregnancy. Try to exercise for 30 minutes at least 5 days a week. Exercising will help you:  ? Control your weight.  ? Stay in shape.  ? Be prepared for labor and delivery.   Experiencing pain or cramping in the lower abdomen or lower back is a good sign that you should stop exercising. Check with your health care provider before continuing with normal exercises.   Try to avoid standing for long periods of time. Move your legs often if you must stand in one place for a long time.   Avoid heavy lifting.   Wear low-heeled shoes and practice good posture.   You may continue to have sex unless your health care   provider tells you not to.  Relieving pain and discomfort   Wear a good support bra to relieve breast tenderness.   Take warm sitz baths to soothe any pain or discomfort caused by hemorrhoids. Use hemorrhoid cream if your health care provider approves.   Rest with your legs elevated if you have leg cramps or low back pain.   If you develop varicose veins in your legs, wear support hose. Elevate your feet for 15 minutes, 3-4 times a day. Limit salt in your diet.  Prenatal care   Schedule your prenatal visits by the twelfth week of pregnancy. They are usually scheduled monthly at first, then more often in the last 2 months before delivery.   Write down your questions. Take them to your prenatal visits.   Keep all your prenatal visits as told by your health care provider. This is important.  Safety   Wear your seat belt at all times when driving.   Make a list of emergency phone numbers, including numbers for family, friends, the hospital, and police and fire departments.  General instructions   Ask your health care provider for a referral to a local prenatal education class. Begin classes no later than the beginning of month 6 of your pregnancy.   Ask for help if you have counseling or nutritional needs during pregnancy. Your health care provider can offer advice or refer you to specialists for help  with various needs.   Do not use hot tubs, steam rooms, or saunas.   Do not douche or use tampons or scented sanitary pads.   Do not cross your legs for long periods of time.   Avoid cat litter boxes and soil used by cats. These carry germs that can cause birth defects in the baby and possibly loss of the fetus by miscarriage or stillbirth.   Avoid all smoking, herbs, alcohol, and medicines not prescribed by your health care provider. Chemicals in these products affect the formation and growth of the baby.   Do not use any products that contain nicotine or tobacco, such as cigarettes and e-cigarettes. If you need help quitting, ask your health care provider. You may receive counseling support and other resources to help you quit.   Schedule a dentist appointment. At home, brush your teeth with a soft toothbrush and be gentle when you floss.  Contact a health care provider if:   You have dizziness.   You have mild pelvic cramps, pelvic pressure, or nagging pain in the abdominal area.   You have persistent nausea, vomiting, or diarrhea.   You have a bad smelling vaginal discharge.   You have pain when you urinate.   You notice increased swelling in your face, hands, legs, or ankles.   You are exposed to fifth disease or chickenpox.   You are exposed to German measles (rubella) and have never had it.  Get help right away if:   You have a fever.   You are leaking fluid from your vagina.   You have spotting or bleeding from your vagina.   You have severe abdominal cramping or pain.   You have rapid weight gain or loss.   You vomit blood or material that looks like coffee grounds.   You develop a severe headache.   You have shortness of breath.   You have any kind of trauma, such as from a fall or a car accident.  Summary   The first trimester of pregnancy is from week 1 until   the end of week 13 (months 1 through 3).   Your body goes through many changes during pregnancy. The changes vary from  woman to woman.   You will have routine prenatal visits. During those visits, your health care provider will examine you, discuss any test results you may have, and talk with you about how you are feeling.  This information is not intended to replace advice given to you by your health care provider. Make sure you discuss any questions you have with your health care provider.  Document Released: 09/17/2001 Document Revised: 09/04/2016 Document Reviewed: 09/04/2016  Elsevier Interactive Patient Education  2019 [employer]

## 2019-02-12 LAB — URINE CULTURE: Organism ID, Bacteria: NO GROWTH

## 2019-02-15 LAB — MATERNIT 21 PLUS CORE, BLOOD
Fetal Fraction: 6
Result (T21): NEGATIVE
Trisomy 13 (Patau syndrome): NEGATIVE
Trisomy 18 (Edwards syndrome): NEGATIVE
Trisomy 21 (Down syndrome): NEGATIVE

## 2019-02-19 LAB — INHERITEST CORE(CF97,SMA,FRAX)

## 2019-03-10 ENCOUNTER — Other Ambulatory Visit: Payer: Self-pay

## 2019-03-10 ENCOUNTER — Ambulatory Visit (INDEPENDENT_AMBULATORY_CARE_PROVIDER_SITE_OTHER): Payer: Managed Care, Other (non HMO) | Admitting: Maternal Newborn

## 2019-03-10 ENCOUNTER — Encounter: Payer: Self-pay | Admitting: Maternal Newborn

## 2019-03-10 DIAGNOSIS — Z3A14 14 weeks gestation of pregnancy: Secondary | ICD-10-CM

## 2019-03-10 DIAGNOSIS — Z3480 Encounter for supervision of other normal pregnancy, unspecified trimester: Secondary | ICD-10-CM

## 2019-03-10 DIAGNOSIS — Z3689 Encounter for other specified antenatal screening: Secondary | ICD-10-CM

## 2019-03-10 DIAGNOSIS — O219 Vomiting of pregnancy, unspecified: Secondary | ICD-10-CM

## 2019-03-10 MED ORDER — DOXYLAMINE-PYRIDOXINE 10-10 MG PO TBEC: 2.0000 | DELAYED_RELEASE_TABLET | Freq: Every day | ORAL | 3 refills | Status: DC

## 2019-03-10 NOTE — Patient Instructions (Signed)

## 2019-03-10 NOTE — Progress Notes (Signed)
Virtual Visit via Telephone Note  I connected with Jill [name] on 03/10/19 at 8:14 AM EDT by telephone and verified that I am speaking with the correct person using two identifiers.  Location: Patient: Home Provider: Office   I discussed the limitations of performing an evaluation and management service by telephone and the availability of in person appointments. The patient expressed understanding and agreed to proceed.  See below for documentation of the prenatal telephone visit:  Subjective  Jill [name] is a 33 y.o. G4P0030 at 4655w2d being seen today for ongoing prenatal care.  She is currently monitored for the following issues for this low-risk pregnancy and has Bipolar 2 disorder, major depressive episode (HCC); OCD (obsessive compulsive disorder); Insomnia due to mental disorder; Major depressive disorder, recurrent, severe without psychotic features (HCC); Dyspareunia in female; Menorrhagia with regular cycle; Dysmenorrhea; Pelvic pain in female; Endometriosis determined by laparoscopy; BMI 37.0-37.9, adult; Bipolar 1 disorder (HCC); Incomplete abortion; and Supervision of other normal pregnancy, antepartum on their problem list.  ----------------------------------------------------------------------------------- Patient reports daily nausea. She tends to vomit when getting out of bed and before dinner time. She is not currently taking medications. She is able to keep down food and fluids. She also reports some cramping, usually at the end of the work day when she comes home.   Contractions: Not present. Vag. Bleeding: None.    No leaking of fluid.  ----------------------------------------------------------------------------------- The following portions of the patient's history were reviewed and updated as appropriate: allergies, current medications, past family history, past medical history, past social history, past surgical history and problem list. Problem list  updated.   Objective  Last menstrual period 11/30/2018. Pregravid weight 265 lb (120.2 kg) Total Weight Gain 4 lb (1.814 kg)  Physical exam could not be completed due to telephone visit.  Assessment   33 y.o. G4P0030 at 7355w2d, EDD 09/06/2019 by Last Menstrual Period presenting for a prenatal telephone visit.  Plan   Pregnancy #2 Problems (from 11/30/18 to present)    Problem Noted Resolved   Supervision of other normal pregnancy, antepartum 01/01/2019 by Natale MilchSchuman, Christanna R, MD No   Overview Signed 01/01/2019 12:06 PM by Natale MilchSchuman, Christanna R, MD      [facility] Prenatal Labs  Dating  Blood type: AB/Positive/-- (04/22 1104)   Genetic Screen NIPS: desired CF/SMA/X:  desired   Antibody:Negative (04/22 1104)  Anatomic US  Rubella: <0.90 (04/22 1104) Varicella: @VZVIGG @  GTT Early:        28 wk:      RPR: Non Reactive (04/22 1104)   Rhogam  HBsAg: Negative (04/22 1104)   TDaP vaccine                       HIV: Non Reactive (04/22 1104)   Flu Shot                                GBS:   Contraception  Pap:  CBB     CS/VBAC    Baby Food    Support Person              [facility]. Will send instructions for OTC Unisom and B6 if medication is not covered.   Please refer to After Visit Summary for other counseling recommendations.   I discussed the assessment and treatment plan with the patient. The patient was provided an opportunity to ask  questions and all were answered. The patient agreed with the plan and demonstrated an understanding of the instructions.   The patient was advised to call back or seek an in-person evaluation if the symptoms worsen or if the condition fails to improve as anticipated.  I provided 9 minutes of non-face-to-face time during this encounter.  Return in about 4 weeks (around 04/07/2019) for ROB and ultrasound.  Marcelyn Bruins, CNM 03/10/2019  8:30 AM

## 2019-03-30 NOTE — Telephone Encounter (Signed)
Please schedule this patient for a ROB and a 1 hour GTT ASAP

## 2019-04-07 ENCOUNTER — Ambulatory Visit (INDEPENDENT_AMBULATORY_CARE_PROVIDER_SITE_OTHER): Payer: Managed Care, Other (non HMO) | Admitting: Maternal Newborn

## 2019-04-07 ENCOUNTER — Ambulatory Visit (INDEPENDENT_AMBULATORY_CARE_PROVIDER_SITE_OTHER): Payer: Managed Care, Other (non HMO)

## 2019-04-07 ENCOUNTER — Other Ambulatory Visit: Payer: Managed Care, Other (non HMO)

## 2019-04-07 ENCOUNTER — Encounter: Payer: Self-pay | Admitting: Maternal Newborn

## 2019-04-07 ENCOUNTER — Other Ambulatory Visit: Payer: Self-pay

## 2019-04-07 VITALS — BP 120/66 | Wt 276.0 lb

## 2019-04-07 DIAGNOSIS — Z3A18 18 weeks gestation of pregnancy: Secondary | ICD-10-CM

## 2019-04-07 DIAGNOSIS — Z363 Encounter for antenatal screening for malformations: Secondary | ICD-10-CM | POA: Diagnosis not present

## 2019-04-07 DIAGNOSIS — Z3480 Encounter for supervision of other normal pregnancy, unspecified trimester: Secondary | ICD-10-CM

## 2019-04-07 DIAGNOSIS — Z3689 Encounter for other specified antenatal screening: Secondary | ICD-10-CM

## 2019-04-07 DIAGNOSIS — Z3482 Encounter for supervision of other normal pregnancy, second trimester: Secondary | ICD-10-CM

## 2019-04-07 NOTE — Patient Instructions (Signed)

## 2019-04-07 NOTE — Progress Notes (Signed)
Anatomy scan today. Still w/morning sickness.

## 2019-04-07 NOTE — Progress Notes (Signed)
Routine Prenatal Care Visit  Subjective  Jill [name] is a 33 y.o. G4P0030 at [redacted]w[redacted]d being seen today for ongoing prenatal care.  She is currently monitored for the following issues for this low-risk pregnancy and has Bipolar 2 disorder, major depressive episode ([place]); OCD (obsessive compulsive disorder); Insomnia due to mental disorder; Major depressive disorder, recurrent, severe without psychotic features ([place]); Dyspareunia in female; Menorrhagia with regular cycle; Dysmenorrhea; Pelvic pain in female; Endometriosis determined by laparoscopy; BMI 37.0-37.9, adult; Bipolar 1 disorder ([place]); Incomplete abortion; and Supervision of other normal pregnancy, antepartum on their problem list.  ----------------------------------------------------------------------------------- Patient reports heartburn, nausea and vomiting.   Contractions: Not present. Vag. Bleeding: None.  Movement: Present. No leaking of fluid.  ----------------------------------------------------------------------------------- The following portions of the patient's history were reviewed and updated as appropriate: allergies, current medications, past family history, past medical history, past social history, past surgical history and problem list. Problem list updated.  Objective  Blood pressure 120/66, weight 276 lb (125.2 kg), last menstrual period 11/30/2018. Pregravid weight 265 lb (120.2 kg) Total Weight Gain 11 lb (4.99 kg)  Fetal Status: Fetal Heart Rate (bpm): Present   Movement: Present     General:  Alert, oriented and cooperative. Patient is in no acute distress.  Skin: Skin is warm and dry. No rash noted.   Cardiovascular: Normal heart rate noted  Respiratory: Normal respiratory effort, no problems with respiration noted  Abdomen: Soft, gravid, appropriate for gestational age. Pain/Pressure: Absent     Pelvic:  Cervical exam deferred        Extremities: Normal range of motion.     Mental Status: Normal  mood and affect. Normal behavior. Normal judgment and thought content.     Assessment   33 y.o. G4P0030 at [redacted]w[redacted]d, EDD 09/06/2019 by Last Menstrual Period presenting for a routine prenatal visit.  Plan   Pregnancy #2 Problems (from 11/30/18 to present)    Problem Noted Resolved   Supervision of other normal pregnancy, antepartum 01/01/2019 by Homero Fellers, MD No   Overview Signed 01/01/2019 12:06 PM by Homero Fellers, MD      [facility] Prenatal Labs  Dating  Blood type: AB/Positive/-- (04/22 1104)   Genetic Screen NIPS: desired CF/SMA/X:  desired   Antibody:Negative (04/22 1104)  Anatomic [place]  Rubella: <0.90 (04/22 1104) Varicella: @VZVIGG @  GTT Early:        28 wk:      RPR: Non Reactive (04/22 1104)   Rhogam  HBsAg: Negative (04/22 1104)   TDaP vaccine                       HIV: Non Reactive (04/22 1104)   Flu Shot                                GBS:   Contraception  Pap:  CBB     CS/VBAC    Baby Food    Support Person              Anatomy scan complete today, normal female anatomy with a choroid plexus cyst on the right. Results discussed with patient.  She had an early GTT today because she had noticed that her urine had a sweet smell like maple syrup. This has resolved.  She still has nausea and vomiting with heartburn. Discussed trying Zofran or Phenergan (she has a supply of these medications from a  previous Rx) and beginning Pepcid OTC.  Please refer to After Visit Summary for other counseling recommendations.   Return in about 4 weeks (around 05/05/2019) for ROB.  Marcelyn BruinsJacelyn Schmid, CNM 04/07/2019  10:20 AM

## 2019-04-08 LAB — GLUCOSE, 1 HOUR GESTATIONAL: Gestational Diabetes Screen: 106 mg/dL (ref 65–139)

## 2019-05-05 ENCOUNTER — Other Ambulatory Visit: Payer: Self-pay

## 2019-05-05 ENCOUNTER — Ambulatory Visit (INDEPENDENT_AMBULATORY_CARE_PROVIDER_SITE_OTHER): Payer: Managed Care, Other (non HMO) | Admitting: Obstetrics & Gynecology

## 2019-05-05 ENCOUNTER — Encounter: Payer: Self-pay | Admitting: Obstetrics & Gynecology

## 2019-05-05 VITALS — BP 120/80 | Wt 281.0 lb

## 2019-05-05 DIAGNOSIS — Z3480 Encounter for supervision of other normal pregnancy, unspecified trimester: Secondary | ICD-10-CM

## 2019-05-05 DIAGNOSIS — Z3482 Encounter for supervision of other normal pregnancy, second trimester: Secondary | ICD-10-CM

## 2019-05-05 DIAGNOSIS — Z3A22 22 weeks gestation of pregnancy: Secondary | ICD-10-CM

## 2019-05-05 DIAGNOSIS — N96 Recurrent pregnancy loss: Secondary | ICD-10-CM

## 2019-05-05 NOTE — Progress Notes (Signed)
  Subjective  Fetal Movement? yes Contractions? no Leaking Fluid? no Vaginal Bleeding? no No nausea Objective  BP 120/80   Wt 281 lb (127.5 kg)   LMP 11/30/2018 (Exact Date)   BMI 41.50 kg/m  General: NAD Pumonary: no increased work of breathing Abdomen: gravid, non-tender Extremities: no edema Psychiatric: mood appropriate, affect full  Assessment  33 y.o. G4P0030 at [redacted]w[redacted]d by  09/06/2019, by Last Menstrual Period presenting for routine prenatal visit  Plan   Problem List Items Addressed This Visit      Other   Supervision of other normal pregnancy, antepartum    Other Visit Diagnoses    [redacted] weeks gestation of pregnancy    -  Primary   Recurrent pregnancy loss        PNV daily Zoloft discussed, daily use  Pregnancy #2 Problems (from 11/30/18 to present)    Problem Noted Resolved   Supervision of other normal pregnancy, antepartum 01/01/2019 by Homero Fellers, MD No   Overview Addendum 04/07/2019 10:16 AM by Rexene Agent, [facility] Prenatal Labs  Dating L=10 Blood type: AB/Positive/-- (03/30 1144)   Genetic Screen NIPS: Negative XY CF/SMA/X:  Negative   Antibody:Negative (03/30 1144)  Anatomic [place] Complete 04/07/2019 Rubella: <0.90 (03/30 1144) Varicella: Immune  GTT Early: 106        28 wk:  Due nv    RPR: Non Reactive (03/30 1144)   Rhogam N/A HBsAg: Negative (03/30 1144)   TDaP vaccine          Due 30 weeks             HIV: Non Reactive (03/30 1144)   Flu Shot           Due Oct 2020              VPX:TGGYIRS 36 wks   Contraception POP Pap: 01/31/2017, NILM/HPV Neg  CBB  No   CS/VBAC n/a   Baby Food Breast   Support Person                 Barnett Applebaum, MD, [facility], [facility] 05/05/2019  8:48 AM

## 2019-05-05 NOTE — Patient Instructions (Signed)

## 2019-05-11 ENCOUNTER — Telehealth: Payer: Self-pay

## 2019-05-11 NOTE — Telephone Encounter (Signed)
Pt calling; has been throwing up all night and this am; pain in side; was told would get rx for vomiting.  [phone]  Pt states has also had diarrhea.  Adv probably a stomach bug.  Clear liquids for 24h, then bland diet for 24h, the work up to regular diet.  Pt aware to be seen if doesn't keep liquids down for 24h.

## 2019-06-02 ENCOUNTER — Other Ambulatory Visit: Payer: Self-pay | Admitting: Advanced Practice Midwife

## 2019-06-02 ENCOUNTER — Other Ambulatory Visit: Payer: Self-pay

## 2019-06-02 ENCOUNTER — Encounter: Payer: Self-pay | Admitting: Advanced Practice Midwife

## 2019-06-02 ENCOUNTER — Other Ambulatory Visit: Payer: Managed Care, Other (non HMO)

## 2019-06-02 ENCOUNTER — Ambulatory Visit (INDEPENDENT_AMBULATORY_CARE_PROVIDER_SITE_OTHER): Payer: Managed Care, Other (non HMO) | Admitting: Advanced Practice Midwife

## 2019-06-02 VITALS — BP 100/60 | Wt 287.0 lb

## 2019-06-02 DIAGNOSIS — Z113 Encounter for screening for infections with a predominantly sexual mode of transmission: Secondary | ICD-10-CM

## 2019-06-02 DIAGNOSIS — O0992 Supervision of high risk pregnancy, unspecified, second trimester: Secondary | ICD-10-CM

## 2019-06-02 DIAGNOSIS — Z131 Encounter for screening for diabetes mellitus: Secondary | ICD-10-CM

## 2019-06-02 DIAGNOSIS — Z3480 Encounter for supervision of other normal pregnancy, unspecified trimester: Secondary | ICD-10-CM

## 2019-06-02 DIAGNOSIS — Z130 Encounter for screening for diseases of the blood and blood-forming organs and certain disorders involving the immune mechanism: Secondary | ICD-10-CM

## 2019-06-02 DIAGNOSIS — Z3A26 26 weeks gestation of pregnancy: Secondary | ICD-10-CM

## 2019-06-02 DIAGNOSIS — O0990 Supervision of high risk pregnancy, unspecified, unspecified trimester: Secondary | ICD-10-CM

## 2019-06-02 LAB — POCT URINALYSIS DIPSTICK OB: Glucose, UA: NEGATIVE

## 2019-06-02 NOTE — Progress Notes (Signed)
Routine Prenatal Care Visit  Subjective  Jill [name] is a 33 y.o. G4P0030 at [redacted]w[redacted]d being seen today for ongoing prenatal care.  She is currently monitored for the following issues for this high-risk pregnancy and has Bipolar 2 disorder, major depressive episode ([place]); OCD (obsessive compulsive disorder); Insomnia due to mental disorder; Major depressive disorder, recurrent, severe without psychotic features ([place]); Dyspareunia in female; Menorrhagia with regular cycle; Dysmenorrhea; Pelvic pain in female; Endometriosis determined by laparoscopy; Supervision of high risk pregnancy, antepartum; BMI 40.0-44.9, adult ([place]); Bipolar 1 disorder ([place]); and Incomplete abortion on their problem list.  ----------------------------------------------------------------------------------- Patient reports no complaints.   Contractions: Not present. Vag. Bleeding: None.  Movement: Present. Leaking Fluid:  She admits some urine leakage.  ----------------------------------------------------------------------------------- The following portions of the patient's history were reviewed and updated as appropriate: allergies, current medications, past family history, past medical history, past social history, past surgical history and problem list. Problem list updated.  Objective  Blood pressure 100/60, weight 287 lb (130.2 kg), last menstrual period 11/30/2018. Pregravid weight 265 lb (120.2 kg) Total Weight Gain 22 lb (9.979 kg) Urinalysis: Urine Protein Trace  Urine Glucose Negative  Fetal Status: Fetal Heart Rate (bpm): 145 Fundal Height: 27 cm Movement: Present     General:  Alert, oriented and cooperative. Patient is in no acute distress.  Skin: Skin is warm and dry. No rash noted.   Cardiovascular: Normal heart rate noted  Respiratory: Normal respiratory effort, no problems with respiration noted  Abdomen: Soft, gravid, appropriate for gestational age. Pain/Pressure: Absent     Pelvic:  Cervical exam  deferred        Extremities: Normal range of motion.     Mental Status: Normal mood and affect. Normal behavior. Normal judgment and thought content.   Assessment   33 y.o. G4P0030 at [redacted]w[redacted]d by  09/06/2019, by Last Menstrual Period presenting for routine prenatal visit  Plan   Pregnancy #2 Problems (from 11/30/18 to present)    Problem Noted Resolved   Supervision of other normal pregnancy, antepartum 01/01/2019 by Homero Fellers, MD No   Overview Addendum 05/05/2019  8:50 AM by Gae Dry, MD      [facility] Prenatal Labs  Dating L=10 Blood type: AB/Positive/-- (03/30 1144)   Genetic Screen NIPS: Negative XY CF/SMA/X:  Negative   Antibody:Negative (03/30 1144)  Anatomic [place] Complete 7/1 Rubella: <0.90 (03/30 1144) Varicella: Immune  GTT Early:106  28 wk:      RPR: Non Reactive (03/30 1144)   Rhogam N/A HBsAg: Negative (03/30 1144)   TDaP vaccine Due 30 wks                      HIV: Non Reactive (03/30 1144)   Flu Shot  due fall 2020                         GBS:   Contraception POP Pap: 01/31/2017, NILM/HPV Neg  CBB  No   CS/VBAC N/A   Baby Food Breast   Support Person                 Preterm labor symptoms and general obstetric precautions including but not limited to vaginal bleeding, contractions, leaking of fluid and fetal movement were reviewed in detail with the patient. Please refer to After Visit Summary for other counseling recommendations.   Return in about 2 weeks (around 06/16/2019) for growth scan and rob.  Rod Can,  CNM 06/02/2019 9:07 AM

## 2019-06-02 NOTE — Patient Instructions (Signed)
Third Trimester of Pregnancy The third trimester is from week 28 through week 40 (months 7 through 9). The third trimester is a time when the unborn baby (fetus) is growing rapidly. At the end of the ninth month, the fetus is about 20 inches in length and weighs 6-10 pounds. Body changes during your third trimester Your body will continue to go through many changes during pregnancy. The changes vary from woman to woman. During the third trimester:  Your weight will continue to increase. You can expect to gain 25-35 pounds (11-16 kg) by the end of the pregnancy.  You may begin to get stretch marks on your hips, abdomen, and breasts.  You may urinate more often because the fetus is moving lower into your pelvis and pressing on your bladder.  You may develop or continue to have heartburn. This is caused by increased hormones that slow down muscles in the digestive tract.  You may develop or continue to have constipation because increased hormones slow digestion and cause the muscles that push waste through your intestines to relax.  You may develop hemorrhoids. These are swollen veins (varicose veins) in the rectum that can itch or be painful.  You may develop swollen, bulging veins (varicose veins) in your legs.  You may have increased body aches in the pelvis, back, or thighs. This is due to weight gain and increased hormones that are relaxing your joints.  You may have changes in your hair. These can include thickening of your hair, rapid growth, and changes in texture. Some women also have hair loss during or after pregnancy, or hair that feels dry or thin. Your hair will most likely return to normal after your baby is born.  Your breasts will continue to grow and they will continue to become tender. A yellow fluid (colostrum) may leak from your breasts. This is the first milk you are producing for your baby.  Your belly button may stick out.  You may notice more swelling in your hands,  face, or ankles.  You may have increased tingling or numbness in your hands, arms, and legs. The skin on your belly may also feel numb.  You may feel short of breath because of your expanding uterus.  You may have more problems sleeping. This can be caused by the size of your belly, increased need to urinate, and an increase in your body's metabolism.  You may notice the fetus "dropping," or moving lower in your abdomen (lightening).  You may have increased vaginal discharge.  You may notice your joints feel loose and you may have pain around your pelvic bone. What to expect at prenatal visits You will have prenatal exams every 2 weeks until week 36. Then you will have weekly prenatal exams. During a routine prenatal visit:  You will be weighed to make sure you and the baby are growing normally.  Your blood pressure will be taken.  Your abdomen will be measured to track your baby's growth.  The fetal heartbeat will be listened to.  Any test results from the previous visit will be discussed.  You may have a cervical check near your due date to see if your cervix has softened or thinned (effaced).  You will be tested for Group B streptococcus. This happens between 35 and 37 weeks. Your health care provider may ask you:  What your birth plan is.  How you are feeling.  If you are feeling the baby move.  If you have had any abnormal   symptoms, such as leaking fluid, bleeding, severe headaches, or abdominal cramping.  If you are using any tobacco products, including cigarettes, chewing tobacco, and electronic cigarettes.  If you have any questions. Other tests or screenings that may be performed during your third trimester include:  Blood tests that check for low iron levels (anemia).  Fetal testing to check the health, activity level, and growth of the fetus. Testing is done if you have certain medical conditions or if there are problems during the pregnancy.  Nonstress test  (NST). This test checks the health of your baby to make sure there are no signs of problems, such as the baby not getting enough oxygen. During this test, a belt is placed around your belly. The baby is made to move, and its heart rate is monitored during movement. What is false labor? False labor is a condition in which you feel small, irregular tightenings of the muscles in the womb (contractions) that usually go away with rest, changing position, or drinking water. These are called Braxton Hicks contractions. Contractions may last for hours, days, or even weeks before true labor sets in. If contractions come at regular intervals, become more frequent, increase in intensity, or become painful, you should see your health care provider. What are the signs of labor?  Abdominal cramps.  Regular contractions that start at 10 minutes apart and become stronger and more frequent with time.  Contractions that start on the top of the uterus and spread down to the lower abdomen and back.  Increased pelvic pressure and dull back pain.  A watery or bloody mucus discharge that comes from the vagina.  Leaking of amniotic fluid. This is also known as your "water breaking." It could be a slow trickle or a gush. Let your health care provider know if it has a color or strange odor. If you have any of these signs, call your health care provider right away, even if it is before your due date. Follow these instructions at home: Medicines  Follow your health care provider's instructions regarding medicine use. Specific medicines may be either safe or unsafe to take during pregnancy.  Take a prenatal vitamin that contains at least 600 micrograms (mcg) of folic acid.  If you develop constipation, try taking a stool softener if your health care provider approves. Eating and drinking   Eat a balanced diet that includes fresh fruits and vegetables, whole grains, good sources of protein such as meat, eggs, or tofu,  and low-fat dairy. Your health care provider will help you determine the amount of weight gain that is right for you.  Avoid raw meat and uncooked cheese. These carry germs that can cause birth defects in the baby.  If you have low calcium intake from food, talk to your health care provider about whether you should take a daily calcium supplement.  Eat four or five small meals rather than three large meals a day.  Limit foods that are high in fat and processed sugars, such as fried and sweet foods.  To prevent constipation: ? Drink enough fluid to keep your urine clear or pale yellow. ? Eat foods that are high in fiber, such as fresh fruits and vegetables, whole grains, and beans. Activity  Exercise only as directed by your health care provider. Most women can continue their usual exercise routine during pregnancy. Try to exercise for 30 minutes at least 5 days a week. Stop exercising if you experience uterine contractions.  Avoid heavy lifting.  Do   not exercise in extreme heat or humidity, or at high altitudes.  Wear low-heel, comfortable shoes.  Practice good posture.  You may continue to have sex unless your health care provider tells you otherwise. Relieving pain and discomfort  Take frequent breaks and rest with your legs elevated if you have leg cramps or low back pain.  Take warm sitz baths to soothe any pain or discomfort caused by hemorrhoids. Use hemorrhoid cream if your health care provider approves.  Wear a good support bra to prevent discomfort from breast tenderness.  If you develop varicose veins: ? Wear support pantyhose or compression stockings as told by your healthcare provider. ? Elevate your feet for 15 minutes, 3-4 times a day. Prenatal care  Write down your questions. Take them to your prenatal visits.  Keep all your prenatal visits as told by your health care provider. This is important. Safety  Wear your seat belt at all times when driving.  Make  a list of emergency phone numbers, including numbers for family, friends, the hospital, and police and fire departments. General instructions  Avoid cat litter boxes and soil used by cats. These carry germs that can cause birth defects in the baby. If you have a cat, ask someone to clean the litter box for you.  Do not travel far distances unless it is absolutely necessary and only with the approval of your health care provider.  Do not use hot tubs, steam rooms, or saunas.  Do not drink alcohol.  Do not use any products that contain nicotine or tobacco, such as cigarettes and e-cigarettes. If you need help quitting, ask your health care provider.  Do not use any medicinal herbs or unprescribed drugs. These chemicals affect the formation and growth of the baby.  Do not douche or use tampons or scented sanitary pads.  Do not cross your legs for long periods of time.  To prepare for the arrival of your baby: ? Take prenatal classes to understand, practice, and ask questions about labor and delivery. ? Make a trial run to the hospital. ? Visit the hospital and tour the maternity area. ? Arrange for maternity or paternity leave through employers. ? Arrange for family and friends to take care of pets while you are in the hospital. ? Purchase a rear-facing car seat and make sure you know how to install it in your car. ? Pack your hospital bag. ? Prepare the baby's nursery. Make sure to remove all pillows and stuffed animals from the baby's crib to prevent suffocation.  Visit your dentist if you have not gone during your pregnancy. Use a soft toothbrush to brush your teeth and be gentle when you floss. Contact a health care provider if:  You are unsure if you are in labor or if your water has broken.  You become dizzy.  You have mild pelvic cramps, pelvic pressure, or nagging pain in your abdominal area.  You have lower back pain.  You have persistent nausea, vomiting, or diarrhea.   You have an unusual or bad smelling vaginal discharge.  You have pain when you urinate. Get help right away if:  Your water breaks before 37 weeks.  You have regular contractions less than 5 minutes apart before 37 weeks.  You have a fever.  You are leaking fluid from your vagina.  You have spotting or bleeding from your vagina.  You have severe abdominal pain or cramping.  You have rapid weight loss or weight gain.  You have   shortness of breath with chest pain.  You notice sudden or extreme swelling of your face, hands, ankles, feet, or legs.  Your baby makes fewer than 10 movements in 2 hours.  You have severe headaches that do not go away when you take medicine.  You have vision changes. Summary  The third trimester is from week 28 through week 40, months 7 through 9. The third trimester is a time when the unborn baby (fetus) is growing rapidly.  During the third trimester, your discomfort may increase as you and your baby continue to gain weight. You may have abdominal, leg, and back pain, sleeping problems, and an increased need to urinate.  During the third trimester your breasts will keep growing and they will continue to become tender. A yellow fluid (colostrum) may leak from your breasts. This is the first milk you are producing for your baby.  False labor is a condition in which you feel small, irregular tightenings of the muscles in the womb (contractions) that eventually go away. These are called Braxton Hicks contractions. Contractions may last for hours, days, or even weeks before true labor sets in.  Signs of labor can include: abdominal cramps; regular contractions that start at 10 minutes apart and become stronger and more frequent with time; watery or bloody mucus discharge that comes from the vagina; increased pelvic pressure and dull back pain; and leaking of amniotic fluid. This information is not intended to replace advice given to you by your health  care provider. Make sure you discuss any questions you have with your health care provider. Document Released: 09/17/2001 Document Revised: 01/14/2019 Document Reviewed: 10/29/2016 Elsevier Patient Education  2020 [employer]  

## 2019-06-02 NOTE — Progress Notes (Signed)
No problems.rj 

## 2019-06-03 LAB — 28 WEEK RH+PANEL
Basophils Absolute: 0 10*3/uL (ref 0.0–0.2)
Basos: 0 %
EOS (ABSOLUTE): 0.1 10*3/uL (ref 0.0–0.4)
Eos: 1 %
Gestational Diabetes Screen: 126 mg/dL (ref 65–139)
HIV Screen 4th Generation wRfx: NONREACTIVE
Hematocrit: 33.8 % — ABNORMAL LOW (ref 34.0–46.6)
Hemoglobin: 11.7 g/dL (ref 11.1–15.9)
Immature Grans (Abs): 0 10*3/uL (ref 0.0–0.1)
Immature Granulocytes: 0 %
Lymphocytes Absolute: 2.5 10*3/uL (ref 0.7–3.1)
Lymphs: 21 %
MCH: 28.5 pg (ref 26.6–33.0)
MCHC: 34.6 g/dL (ref 31.5–35.7)
MCV: 82 fL (ref 79–97)
Monocytes Absolute: 0.7 10*3/uL (ref 0.1–0.9)
Monocytes: 6 %
Neutrophils Absolute: 8.3 10*3/uL — ABNORMAL HIGH (ref 1.4–7.0)
Neutrophils: 72 %
Platelets: 314 10*3/uL (ref 150–450)
RBC: 4.1 x10E6/uL (ref 3.77–5.28)
RDW: 13.2 % (ref 11.7–15.4)
RPR Ser Ql: NONREACTIVE
WBC: 11.6 10*3/uL — ABNORMAL HIGH (ref 3.4–10.8)

## 2019-06-16 ENCOUNTER — Ambulatory Visit (INDEPENDENT_AMBULATORY_CARE_PROVIDER_SITE_OTHER): Payer: Managed Care, Other (non HMO) | Admitting: Obstetrics and Gynecology

## 2019-06-16 ENCOUNTER — Encounter: Payer: Self-pay | Admitting: Obstetrics and Gynecology

## 2019-06-16 ENCOUNTER — Ambulatory Visit (INDEPENDENT_AMBULATORY_CARE_PROVIDER_SITE_OTHER): Payer: Managed Care, Other (non HMO)

## 2019-06-16 ENCOUNTER — Other Ambulatory Visit: Payer: Self-pay

## 2019-06-16 VITALS — BP 110/64 | Wt 290.0 lb

## 2019-06-16 DIAGNOSIS — O0990 Supervision of high risk pregnancy, unspecified, unspecified trimester: Secondary | ICD-10-CM

## 2019-06-16 DIAGNOSIS — O99613 Diseases of the digestive system complicating pregnancy, third trimester: Secondary | ICD-10-CM

## 2019-06-16 DIAGNOSIS — Z3A28 28 weeks gestation of pregnancy: Secondary | ICD-10-CM

## 2019-06-16 DIAGNOSIS — K219 Gastro-esophageal reflux disease without esophagitis: Secondary | ICD-10-CM

## 2019-06-16 DIAGNOSIS — Z23 Encounter for immunization: Secondary | ICD-10-CM | POA: Diagnosis not present

## 2019-06-16 DIAGNOSIS — Z362 Encounter for other antenatal screening follow-up: Secondary | ICD-10-CM | POA: Diagnosis not present

## 2019-06-16 MED ORDER — SUCRALFATE 1 G PO TABS: 1.0000 g | ORAL_TABLET | Freq: Three times a day (TID) | ORAL | 3 refills | Status: DC

## 2019-06-16 NOTE — Progress Notes (Signed)
Routine Prenatal Care Visit  Subjective  Jill [name] is a 33 y.o. G4P0030 at [redacted]w[redacted]d being seen today for ongoing prenatal care.  She is currently monitored for the following issues for this high-risk pregnancy and has Bipolar 2 disorder, major depressive episode ([place]); OCD (obsessive compulsive disorder); Insomnia due to mental disorder; Major depressive disorder, recurrent, severe without psychotic features ([place]); Dyspareunia in female; Menorrhagia with regular cycle; Dysmenorrhea; Pelvic pain in female; Endometriosis determined by laparoscopy; Supervision of high risk pregnancy, antepartum; BMI 40.0-44.9, adult ([place]); Bipolar 1 disorder ([place]); and Incomplete abortion on their problem list.  ----------------------------------------------------------------------------------- Patient reports heartburn. Swelling of bilateral lowe extremeties.    Contractions: Not present. Vag. Bleeding: None.  Movement: Present. Denies leaking of fluid.  ----------------------------------------------------------------------------------- The following portions of the patient's history were reviewed and updated as appropriate: allergies, current medications, past family history, past medical history, past social history, past surgical history and problem list. Problem list updated.   Objective  Blood pressure 110/64, weight 290 lb (131.5 kg), last menstrual period 11/30/2018. Pregravid weight 265 lb (120.2 kg) Total Weight Gain 25 lb (11.3 kg) Urinalysis:      Fetal Status: Fetal Heart Rate (bpm): 160 Fundal Height: 28 cm Movement: Present     General:  Alert, oriented and cooperative. Patient is in no acute distress.  Skin: Skin is warm and dry. No rash noted.   Cardiovascular: Normal heart rate noted  Respiratory: Normal respiratory effort, no problems with respiration noted  Abdomen: Soft, gravid, appropriate for gestational age. Pain/Pressure: Absent     Pelvic:  Cervical exam deferred         Extremities: Normal range of motion.  Edema: Mild pitting, slight indentation  Mental Status: Normal mood and affect. Normal behavior. Normal judgment and thought content.     Assessment   33 y.o. G4P0030 at [redacted]w[redacted]d by  09/06/2019, by Last Menstrual Period presenting for routine prenatal visit  Plan   Pregnancy #2 Problems (from 11/30/18 to present)    Problem Noted Resolved   BMI 40.0-44.9, adult ([place]) 01/13/2018 by Will Bonnet, MD No   Supervision of other normal pregnancy, antepartum 01/01/2019 by Homero Fellers, MD 06/02/2019 by Rod Can, CNM   Overview Addendum 05/05/2019  8:50 AM by Gae Dry, MD      Clinic [facility] Prenatal Labs  Dating L=10 Blood type: AB/Positive/-- (03/30 1144)   Genetic Screen NIPS: Negative XY CF/SMA/X:  Negative   Antibody:Negative (03/30 1144)  Anatomic [place] Complete 7/1 Rubella: <0.90 (03/30 1144) Varicella: Immune  GTT Early:106  28 wk:      RPR: Non Reactive (03/30 1144)   Rhogam N/A HBsAg: Negative (03/30 1144)   TDaP vaccine Due 30 wks                      HIV: Non Reactive (03/30 1144)   Flu Shot  due fall 2020                         GBS:   Contraception POP Pap: 01/31/2017, NILM/HPV Neg  CBB  No   CS/VBAC N/A   Baby Food Breast   Support Person                 Gestational age appropriate obstetric precautions including but not limited to vaginal bleeding, contractions, leaking of fluid and fetal movement were reviewed in detail with the patient.    GERD, reports waking  up at night coughing from reflux- will start carafate Growth US today normal Flu shot today.   Return in about 2 weeks (around 06/30/2019) for ROB in person.  Natale Milchhristanna R Schuman MD [facility], [facility] 06/16/2019, 4:40 PM

## 2019-06-16 NOTE — Progress Notes (Signed)
ROB/US C/o swelling in ankles Denies lof, no vb, Good FM

## 2019-06-30 ENCOUNTER — Ambulatory Visit (INDEPENDENT_AMBULATORY_CARE_PROVIDER_SITE_OTHER): Payer: Managed Care, Other (non HMO) | Admitting: Obstetrics and Gynecology

## 2019-06-30 ENCOUNTER — Other Ambulatory Visit: Payer: Self-pay

## 2019-06-30 VITALS — BP 120/70 | Wt 290.0 lb

## 2019-06-30 DIAGNOSIS — O0993 Supervision of high risk pregnancy, unspecified, third trimester: Secondary | ICD-10-CM

## 2019-06-30 DIAGNOSIS — N96 Recurrent pregnancy loss: Secondary | ICD-10-CM

## 2019-06-30 DIAGNOSIS — K219 Gastro-esophageal reflux disease without esophagitis: Secondary | ICD-10-CM

## 2019-06-30 DIAGNOSIS — Z23 Encounter for immunization: Secondary | ICD-10-CM

## 2019-06-30 DIAGNOSIS — O0990 Supervision of high risk pregnancy, unspecified, unspecified trimester: Secondary | ICD-10-CM

## 2019-06-30 DIAGNOSIS — Z3A30 30 weeks gestation of pregnancy: Secondary | ICD-10-CM

## 2019-06-30 DIAGNOSIS — O09293 Supervision of pregnancy with other poor reproductive or obstetric history, third trimester: Secondary | ICD-10-CM

## 2019-06-30 DIAGNOSIS — O99213 Obesity complicating pregnancy, third trimester: Secondary | ICD-10-CM

## 2019-06-30 LAB — POCT URINALYSIS DIPSTICK OB: Glucose, UA: NEGATIVE

## 2019-06-30 MED ORDER — OMEPRAZOLE 20 MG PO CPDR: 20.0000 mg | DELAYED_RELEASE_CAPSULE | Freq: Every day | ORAL | 11 refills | Status: DC

## 2019-06-30 NOTE — Progress Notes (Signed)
Routine Prenatal Care Visit  Subjective  Jill [name] is a 33 y.o. G4P0030 at [redacted]w[redacted]d being seen today for ongoing prenatal care.  She is currently monitored for the following issues for this high-risk pregnancy and has Bipolar 2 disorder, major depressive episode ([facility]); OCD (obsessive compulsive disorder); Insomnia due to mental disorder; Major depressive disorder, recurrent, severe without psychotic features ([facility]); Dyspareunia in female; Menorrhagia with regular cycle; Dysmenorrhea; Pelvic pain in female; Endometriosis determined by laparoscopy; Supervision of high risk pregnancy, antepartum; BMI 40.0-44.9, adult ([facility]); Bipolar 1 disorder ([facility]); and Incomplete abortion on their problem list.  ----------------------------------------------------------------------------------- Patient reports continued GERD symptoms with carafate.   Contractions: Not present. Vag. Bleeding: None.  Movement: Present. Denies leaking of fluid.  ----------------------------------------------------------------------------------- The following portions of the patient's history were reviewed and updated as appropriate: allergies, current medications, past family history, past medical history, past social history, past surgical history and problem list. Problem list updated.   Objective  Blood pressure 120/70, weight 290 lb (131.5 kg), last menstrual period 11/30/2018. Pregravid weight 265 lb (120.2 kg) Total Weight Gain 25 lb (11.3 kg) Urinalysis:      Fetal Status: Fetal Heart Rate (bpm): 145 Fundal Height: 33 cm Movement: Present     General:  Alert, oriented and cooperative. Patient is in no acute distress.  Skin: Skin is warm and dry. No rash noted.   Cardiovascular: Normal heart rate noted  Respiratory: Normal respiratory effort, no problems with respiration noted  Abdomen: Soft, gravid, appropriate for gestational age. Pain/Pressure: Absent     Pelvic:  Cervical exam deferred        Extremities:  Normal range of motion.     Mental Status: Normal mood and affect. Normal behavior. Normal judgment and thought content.     Assessment   33 y.o. G4P0030 at [redacted]w[redacted]d by  09/06/2019, by Last Menstrual Period presenting for routine prenatal visit  Plan   Pregnancy #2 Problems (from 11/30/18 to present)    Problem Noted Resolved   BMI 40.0-44.9, adult ([facility]) 01/13/2018 by Conard Novak, MD No   Supervision of other normal pregnancy, antepartum 01/01/2019 by Natale Milch, MD 06/02/2019 by Tresea Mall, CNM   Overview Addendum 05/05/2019  8:50 AM by Nadara Mustard, MD      Clinic [facility] Prenatal Labs  Dating L=10 Blood type: AB/Positive/-- (03/30 1144)   Genetic Screen NIPS: Negative XY CF/SMA/X:  Negative   Antibody:Negative (03/30 1144)  Anatomic [place] Complete 7/1 Rubella: <0.90 (03/30 1144) Varicella: Immune  GTT Early:106  28 wk:      RPR: Non Reactive (03/30 1144)   Rhogam N/A HBsAg: Negative (03/30 1144)   TDaP vaccine Due 30 wks                      HIV: Non Reactive (03/30 1144)   Flu Shot  due fall 2020                         GBS:   Contraception POP Pap: 01/31/2017, NILM/HPV Neg  CBB  No   CS/VBAC N/A   Baby Food Breast   Support Person                 Gestational age appropriate obstetric precautions including but not limited to vaginal bleeding, contractions, leaking of fluid and fetal movement were reviewed in detail with the patient.    Growth [place] next visit Discussed breast feeding- given  book and directed to [email or web address] RX for PPI sent  Return in about 2 weeks (around 07/14/2019) for [place] and [place] in person.  Homero Fellers MD [facility], [facility] 06/30/2019, 8:35 AM

## 2019-06-30 NOTE — Addendum Note (Signed)
Addended by: Raechel Chute on: 06/30/2019 08:47 AM   Modules accepted: Orders

## 2019-06-30 NOTE — Progress Notes (Signed)
ROB No concerns Tdap/BT consent

## 2019-07-14 ENCOUNTER — Ambulatory Visit (INDEPENDENT_AMBULATORY_CARE_PROVIDER_SITE_OTHER): Payer: Managed Care, Other (non HMO)

## 2019-07-14 ENCOUNTER — Ambulatory Visit (INDEPENDENT_AMBULATORY_CARE_PROVIDER_SITE_OTHER): Payer: Managed Care, Other (non HMO) | Admitting: Obstetrics and Gynecology

## 2019-07-14 ENCOUNTER — Other Ambulatory Visit: Payer: Self-pay

## 2019-07-14 VITALS — BP 120/80 | Wt 296.0 lb

## 2019-07-14 DIAGNOSIS — O99213 Obesity complicating pregnancy, third trimester: Secondary | ICD-10-CM

## 2019-07-14 DIAGNOSIS — Z362 Encounter for other antenatal screening follow-up: Secondary | ICD-10-CM

## 2019-07-14 DIAGNOSIS — O0990 Supervision of high risk pregnancy, unspecified, unspecified trimester: Secondary | ICD-10-CM

## 2019-07-14 DIAGNOSIS — O0993 Supervision of high risk pregnancy, unspecified, third trimester: Secondary | ICD-10-CM

## 2019-07-14 DIAGNOSIS — Z3A32 32 weeks gestation of pregnancy: Secondary | ICD-10-CM

## 2019-07-14 NOTE — Progress Notes (Signed)
Routine Prenatal Care Visit  Subjective  Jill [name] is a 33 y.o. G4P0030 at [redacted]w[redacted]d being seen today for ongoing prenatal care.  She is currently monitored for the following issues for this high-risk pregnancy and has Bipolar 2 disorder, major depressive episode ([facility]); OCD (obsessive compulsive disorder); Insomnia due to mental disorder; Major depressive disorder, recurrent, severe without psychotic features ([facility]); Dyspareunia in female; Menorrhagia with regular cycle; Dysmenorrhea; Pelvic pain in female; Endometriosis determined by laparoscopy; Supervision of high risk pregnancy, antepartum; BMI 40.0-44.9, adult ([facility]); Bipolar 1 disorder ([facility]); and Incomplete abortion on their problem list.  ----------------------------------------------------------------------------------- Patient reports no complaints.   Contractions: Not present. Vag. Bleeding: None.  Movement: Present. Denies leaking of fluid.  ----------------------------------------------------------------------------------- The following portions of the patient's history were reviewed and updated as appropriate: allergies, current medications, past family history, past medical history, past social history, past surgical history and problem list. Problem list updated.   Objective  Blood pressure 120/80, weight 296 lb (134.3 kg), last menstrual period 11/30/2018. Pregravid weight 265 lb (120.2 kg) Total Weight Gain 31 lb (14.1 kg) Urinalysis:      Fetal Status: Fetal Heart Rate (bpm): 143   Movement: Present     General:  Alert, oriented and cooperative. Patient is in no acute distress.  Skin: Skin is warm and dry. No rash noted.   Cardiovascular: Normal heart rate noted  Respiratory: Normal respiratory effort, no problems with respiration noted  Abdomen: Soft, gravid, appropriate for gestational age. Pain/Pressure: Absent     Pelvic:  Cervical exam deferred        Extremities: Normal range of motion.     Mental Status:  Normal mood and affect. Normal behavior. Normal judgment and thought content.     Assessment   33 y.o. G4P0030 at [redacted]w[redacted]d by  09/06/2019, by Last Menstrual Period presenting for routine prenatal visit  Plan   Pregnancy #2 Problems (from 11/30/18 to present)    Problem Noted Resolved   BMI 40.0-44.9, adult ([facility]) 01/13/2018 by Conard Novak, MD No   Supervision of other normal pregnancy, antepartum 01/01/2019 by Natale Milch, MD 06/02/2019 by Tresea Mall, CNM   Overview Addendum 05/05/2019  8:50 AM by Nadara Mustard, MD      [facility]inic [facility] Prenatal Labs  Dating L=10 Blood type: AB/Positive/-- (03/30 1144)   Genetic Screen NIPS: Negative XY CF/SMA/X:  Negative   Antibody:Negative (03/30 1144)  Anatomic [place] Complete 7/1 Rubella: <0.90 (03/30 1144) Varicella: Immune  GTT Early:106  28 wk:      RPR: Non Reactive (03/30 1144)   Rhogam N/A HBsAg: Negative (03/30 1144)   TDaP vaccine Due 30 wks                      HIV: Non Reactive (03/30 1144)   Flu Shot  due fall 2020                         GBS:   Contraception POP Pap: 01/31/2017, NILM/HPV Neg  CBB  No   CS/VBAC N/A   Baby Food Breast   Support Person                 Gestational age appropriate obstetric precautions including but not limited to vaginal bleeding, contractions, leaking of fluid and fetal movement were reviewed in detail with the patient.    Growth [place] today normal Did not pick up PPI, she forgot that I  had sent this medication.  No questions, reading breastfeeding book.   Return in about 2 weeks (around 07/28/2019) for ROB in person.  Homero Fellers MD [facility], [facility] 07/14/2019, 9:58 AM

## 2019-07-14 NOTE — Progress Notes (Signed)
ROB/US No concerns Denies lof, no vb Good FM

## 2019-07-28 ENCOUNTER — Encounter: Payer: Self-pay | Admitting: Maternal Newborn

## 2019-07-28 ENCOUNTER — Other Ambulatory Visit: Payer: Self-pay

## 2019-07-28 ENCOUNTER — Ambulatory Visit (INDEPENDENT_AMBULATORY_CARE_PROVIDER_SITE_OTHER): Payer: Managed Care, Other (non HMO) | Admitting: Maternal Newborn

## 2019-07-28 VITALS — BP 120/80 | Wt 297.0 lb

## 2019-07-28 DIAGNOSIS — O0990 Supervision of high risk pregnancy, unspecified, unspecified trimester: Secondary | ICD-10-CM

## 2019-07-28 DIAGNOSIS — Z3A34 34 weeks gestation of pregnancy: Secondary | ICD-10-CM

## 2019-07-28 DIAGNOSIS — O219 Vomiting of pregnancy, unspecified: Secondary | ICD-10-CM

## 2019-07-28 LAB — POCT URINALYSIS DIPSTICK OB: Glucose, UA: NEGATIVE

## 2019-07-28 MED ORDER — PROMETHAZINE HCL 25 MG PO TABS: 25.0000 mg | ORAL_TABLET | Freq: Four times a day (QID) | ORAL | 2 refills | Status: DC | PRN

## 2019-07-28 NOTE — Patient Instructions (Signed)
Third Trimester of Pregnancy The third trimester is from week 28 through week 40 (months 7 through 9). The third trimester is a time when the unborn baby (fetus) is growing rapidly. At the end of the ninth month, the fetus is about 20 inches in length and weighs 6-10 pounds. Body changes during your third trimester Your body will continue to go through many changes during pregnancy. The changes vary from woman to woman. During the third trimester:  Your weight will continue to increase. You can expect to gain 25-35 pounds (11-16 kg) by the end of the pregnancy.  You may begin to get stretch marks on your hips, abdomen, and breasts.  You may urinate more often because the fetus is moving lower into your pelvis and pressing on your bladder.  You may develop or continue to have heartburn. This is caused by increased hormones that slow down muscles in the digestive tract.  You may develop or continue to have constipation because increased hormones slow digestion and cause the muscles that push waste through your intestines to relax.  You may develop hemorrhoids. These are swollen veins (varicose veins) in the rectum that can itch or be painful.  You may develop swollen, bulging veins (varicose veins) in your legs.  You may have increased body aches in the pelvis, back, or thighs. This is due to weight gain and increased hormones that are relaxing your joints.  You may have changes in your hair. These can include thickening of your hair, rapid growth, and changes in texture. Some women also have hair loss during or after pregnancy, or hair that feels dry or thin. Your hair will most likely return to normal after your baby is born.  Your breasts will continue to grow and they will continue to become tender. A yellow fluid (colostrum) may leak from your breasts. This is the first milk you are producing for your baby.  Your belly button may stick out.  You may notice more swelling in your hands,  face, or ankles.  You may have increased tingling or numbness in your hands, arms, and legs. The skin on your belly may also feel numb.  You may feel short of breath because of your expanding uterus.  You may have more problems sleeping. This can be caused by the size of your belly, increased need to urinate, and an increase in your body's metabolism.  You may notice the fetus "dropping," or moving lower in your abdomen (lightening).  You may have increased vaginal discharge.  You may notice your joints feel loose and you may have pain around your pelvic bone. What to expect at prenatal visits You will have prenatal exams every 2 weeks until week 36. Then you will have weekly prenatal exams. During a routine prenatal visit:  You will be weighed to make sure you and the baby are growing normally.  Your blood pressure will be taken.  Your abdomen will be measured to track your baby's growth.  The fetal heartbeat will be listened to.  Any test results from the previous visit will be discussed.  You may have a cervical check near your due date to see if your cervix has softened or thinned (effaced).  You will be tested for Group B streptococcus. This happens between 35 and 37 weeks. Your health care provider may ask you:  What your birth plan is.  How you are feeling.  If you are feeling the baby move.  If you have had any abnormal   symptoms, such as leaking fluid, bleeding, severe headaches, or abdominal cramping.  If you are using any tobacco products, including cigarettes, chewing tobacco, and electronic cigarettes.  If you have any questions. Other tests or screenings that may be performed during your third trimester include:  Blood tests that check for low iron levels (anemia).  Fetal testing to check the health, activity level, and growth of the fetus. Testing is done if you have certain medical conditions or if there are problems during the pregnancy.  Nonstress test  (NST). This test checks the health of your baby to make sure there are no signs of problems, such as the baby not getting enough oxygen. During this test, a belt is placed around your belly. The baby is made to move, and its heart rate is monitored during movement. What is false labor? False labor is a condition in which you feel small, irregular tightenings of the muscles in the womb (contractions) that usually go away with rest, changing position, or drinking water. These are called Braxton Hicks contractions. Contractions may last for hours, days, or even weeks before true labor sets in. If contractions come at regular intervals, become more frequent, increase in intensity, or become painful, you should see your health care provider. What are the signs of labor?  Abdominal cramps.  Regular contractions that start at 10 minutes apart and become stronger and more frequent with time.  Contractions that start on the top of the uterus and spread down to the lower abdomen and back.  Increased pelvic pressure and dull back pain.  A watery or bloody mucus discharge that comes from the vagina.  Leaking of amniotic fluid. This is also known as your "water breaking." It could be a slow trickle or a gush. Let your health care provider know if it has a color or strange odor. If you have any of these signs, call your health care provider right away, even if it is before your due date. Follow these instructions at home: Medicines  Follow your health care provider's instructions regarding medicine use. Specific medicines may be either safe or unsafe to take during pregnancy.  Take a prenatal vitamin that contains at least 600 micrograms (mcg) of folic acid.  If you develop constipation, try taking a stool softener if your health care provider approves. Eating and drinking   Eat a balanced diet that includes fresh fruits and vegetables, whole grains, good sources of protein such as meat, eggs, or tofu,  and low-fat dairy. Your health care provider will help you determine the amount of weight gain that is right for you.  Avoid raw meat and uncooked cheese. These carry germs that can cause birth defects in the baby.  If you have low calcium intake from food, talk to your health care provider about whether you should take a daily calcium supplement.  Eat four or five small meals rather than three large meals a day.  Limit foods that are high in fat and processed sugars, such as fried and sweet foods.  To prevent constipation: ? Drink enough fluid to keep your urine clear or pale yellow. ? Eat foods that are high in fiber, such as fresh fruits and vegetables, whole grains, and beans. Activity  Exercise only as directed by your health care provider. Most women can continue their usual exercise routine during pregnancy. Try to exercise for 30 minutes at least 5 days a week. Stop exercising if you experience uterine contractions.  Avoid heavy lifting.  Do   not exercise in extreme heat or humidity, or at high altitudes.  Wear low-heel, comfortable shoes.  Practice good posture.  You may continue to have sex unless your health care provider tells you otherwise. Relieving pain and discomfort  Take frequent breaks and rest with your legs elevated if you have leg cramps or low back pain.  Take warm sitz baths to soothe any pain or discomfort caused by hemorrhoids. Use hemorrhoid cream if your health care provider approves.  Wear a good support bra to prevent discomfort from breast tenderness.  If you develop varicose veins: ? Wear support pantyhose or compression stockings as told by your healthcare provider. ? Elevate your feet for 15 minutes, 3-4 times a day. Prenatal care  Write down your questions. Take them to your prenatal visits.  Keep all your prenatal visits as told by your health care provider. This is important. Safety  Wear your seat belt at all times when driving.  Make  a list of emergency phone numbers, including numbers for family, friends, the hospital, and police and fire departments. General instructions  Avoid cat litter boxes and soil used by cats. These carry germs that can cause birth defects in the baby. If you have a cat, ask someone to clean the litter box for you.  Do not travel far distances unless it is absolutely necessary and only with the approval of your health care provider.  Do not use hot tubs, steam rooms, or saunas.  Do not drink alcohol.  Do not use any products that contain nicotine or tobacco, such as cigarettes and e-cigarettes. If you need help quitting, ask your health care provider.  Do not use any medicinal herbs or unprescribed drugs. These chemicals affect the formation and growth of the baby.  Do not douche or use tampons or scented sanitary pads.  Do not cross your legs for long periods of time.  To prepare for the arrival of your baby: ? Take prenatal classes to understand, practice, and ask questions about labor and delivery. ? Make a trial run to the hospital. ? Visit the hospital and tour the maternity area. ? Arrange for maternity or paternity leave through employers. ? Arrange for family and friends to take care of pets while you are in the hospital. ? Purchase a rear-facing car seat and make sure you know how to install it in your car. ? Pack your hospital bag. ? Prepare the baby's nursery. Make sure to remove all pillows and stuffed animals from the baby's crib to prevent suffocation.  Visit your dentist if you have not gone during your pregnancy. Use a soft toothbrush to brush your teeth and be gentle when you floss. Contact a health care provider if:  You are unsure if you are in labor or if your water has broken.  You become dizzy.  You have mild pelvic cramps, pelvic pressure, or nagging pain in your abdominal area.  You have lower back pain.  You have persistent nausea, vomiting, or diarrhea.   You have an unusual or bad smelling vaginal discharge.  You have pain when you urinate. Get help right away if:  Your water breaks before 37 weeks.  You have regular contractions less than 5 minutes apart before 37 weeks.  You have a fever.  You are leaking fluid from your vagina.  You have spotting or bleeding from your vagina.  You have severe abdominal pain or cramping.  You have rapid weight loss or weight gain.  You have   shortness of breath with chest pain.  You notice sudden or extreme swelling of your face, hands, ankles, feet, or legs.  Your baby makes fewer than 10 movements in 2 hours.  You have severe headaches that do not go away when you take medicine.  You have vision changes. Summary  The third trimester is from week 28 through week 40, months 7 through 9. The third trimester is a time when the unborn baby (fetus) is growing rapidly.  During the third trimester, your discomfort may increase as you and your baby continue to gain weight. You may have abdominal, leg, and back pain, sleeping problems, and an increased need to urinate.  During the third trimester your breasts will keep growing and they will continue to become tender. A yellow fluid (colostrum) may leak from your breasts. This is the first milk you are producing for your baby.  False labor is a condition in which you feel small, irregular tightenings of the muscles in the womb (contractions) that eventually go away. These are called Braxton Hicks contractions. Contractions may last for hours, days, or even weeks before true labor sets in.  Signs of labor can include: abdominal cramps; regular contractions that start at 10 minutes apart and become stronger and more frequent with time; watery or bloody mucus discharge that comes from the vagina; increased pelvic pressure and dull back pain; and leaking of amniotic fluid. This information is not intended to replace advice given to you by your health  care provider. Make sure you discuss any questions you have with your health care provider. Document Released: 09/17/2001 Document Revised: 01/14/2019 Document Reviewed: 10/29/2016 Elsevier Patient Education  2020 [employer]  

## 2019-07-28 NOTE — Progress Notes (Signed)
C/o having a lot of pelvic, leg and back pain; having ctxs. rj

## 2019-07-28 NOTE — Progress Notes (Signed)
Routine Prenatal Care Visit  Subjective  Jill [name] is a 34 y.o. G4P0030 at [redacted]w[redacted]d being seen today for ongoing prenatal care.  She is currently monitored for the following issues for this high-risk pregnancy and has Bipolar 2 disorder, major depressive episode (HCC); OCD (obsessive compulsive disorder); Insomnia due to mental disorder; Major depressive disorder, recurrent, severe without psychotic features (HCC); Dyspareunia in female; Menorrhagia with regular cycle; Dysmenorrhea; Pelvic pain in female; Endometriosis determined by laparoscopy; Supervision of high risk pregnancy, antepartum; BMI 40.0-44.9, adult (HCC); Bipolar 1 disorder (HCC); and Incomplete abortion on their problem list.  ----------------------------------------------------------------------------------- Patient reports back pain, some contractions and round ligament pain mostly happening at work. Also having nausea and vomiting at night; hard to eat dinner. Medications are not helping. Contractions: Not present. Vag. Bleeding: None.  Movement: Present. No leaking of fluid.  ----------------------------------------------------------------------------------- The following portions of the patient's history were reviewed and updated as appropriate: allergies, current medications, past family history, past medical history, past social history, past surgical history and problem list. Problem list updated.   Objective  Blood pressure 120/80, weight 297 lb (134.7 kg), last menstrual period 11/30/2018. Pregravid weight 265 lb (120.2 kg) Total Weight Gain 32 lb (14.5 kg) Urinalysis: Urine dipstick shows negative for glucose, positive for protein (trace). Fetal Status: Fetal Heart Rate (bpm): 140 Fundal Height: 35 cm Movement: Present     General:  Alert, oriented and cooperative. Patient is in no acute distress.  Skin: Skin is warm and dry. No rash noted.   Cardiovascular: Normal heart rate noted  Respiratory: Normal  respiratory effort, no problems with respiration noted  Abdomen: Soft, gravid, appropriate for gestational age. Pain/Pressure: Absent     Pelvic:  Cervical exam deferred        Extremities: Normal range of motion.     Mental Status: Normal mood and affect. Normal behavior. Normal judgment and thought content.     Assessment   33 y.o. G4P0030 at [redacted]w[redacted]d, EDD 09/06/2019 by Last Menstrual Period presenting for a routine prenatal visit.  Plan   Pregnancy #2 Problems (from 11/30/18 to present)    Problem Noted Resolved   BMI 40.0-44.9, adult (HCC) 01/13/2018 by Conard Novak, MD No   Supervision of other normal pregnancy, antepartum 01/01/2019 by Natale Milch, MD 06/02/2019 by Tresea Mall, CNM   Overview Addendum 05/05/2019  8:50 AM by Nadara Mustard, MD      Clinic [facility] Prenatal Labs  Dating L=10 Blood type: AB/Positive/-- (03/30 1144)   Genetic Screen NIPS: Negative XY CF/SMA/X:  Negative   Antibody:Negative (03/30 1144)  Anatomic [place] Complete 7/1 Rubella: <0.90 (03/30 1144) Varicella: Immune  GTT Early:106  28 wk:      RPR: Non Reactive (03/30 1144)   Rhogam N/A HBsAg: Negative (03/30 1144)   TDaP vaccine Due 30 wks                      HIV: Non Reactive (03/30 1144)   Flu Shot  due fall 2020                         GBS:   Contraception POP Pap: 01/31/2017, NILM/HPV Neg  CBB  No   CS/VBAC N/A   Baby Food Breast   Support Person                Using maternity support belt. Eating small meals and snacks. Will try Phenergan for  nausea and vomiting.  Please refer to After Visit Summary for other counseling recommendations.   Return in about 2 weeks (around 08/11/2019) for ROB.  Avel Sensor, CNM 07/28/2019  8:34 AM

## 2019-08-05 ENCOUNTER — Other Ambulatory Visit: Payer: Self-pay | Admitting: Obstetrics and Gynecology

## 2019-08-05 DIAGNOSIS — M549 Dorsalgia, unspecified: Secondary | ICD-10-CM

## 2019-08-05 NOTE — Progress Notes (Signed)
Called and discussed patient's desire for being placed on leave for pregnancy. She is having a difficult time with pelvic pain. She has pain in her legs which is making it uncomfortable to lift her legs. She has vaginal pain and pressure. She also report back pain. She feels like it is difficult to walk. She has tried tylenol and support garments as well as some exercises in a prenatal book with limited results. She is willing to see physical therapy.  Encouraged patient to speak with work regarding any options for a reduced schedule which might make work more tolerable so that she could maximize time at home with infant after work. Told patient I would be willing to provide a work note. She will see me for her prenatal visit next week and we can discuss further.   Adrian Prows MD [facility], [facility] 08/05/2019 8:50 AM

## 2019-08-05 NOTE — Telephone Encounter (Signed)
Called and discussed with patient

## 2019-08-11 ENCOUNTER — Encounter: Payer: Self-pay | Admitting: Obstetrics and Gynecology

## 2019-08-11 ENCOUNTER — Ambulatory Visit (INDEPENDENT_AMBULATORY_CARE_PROVIDER_SITE_OTHER): Payer: Managed Care, Other (non HMO) | Admitting: Obstetrics and Gynecology

## 2019-08-11 ENCOUNTER — Other Ambulatory Visit: Payer: Self-pay

## 2019-08-11 ENCOUNTER — Other Ambulatory Visit (HOSPITAL_COMMUNITY)
Admission: RE | Admit: 2019-08-11 | Discharge: 2019-08-11 | Disposition: A | Discharge status: Home / Self Care | Payer: Managed Care, Other (non HMO) | Source: Ambulatory Visit | Attending: Obstetrics and Gynecology | Admitting: Obstetrics and Gynecology

## 2019-08-11 VITALS — BP 120/80 | Wt 301.0 lb

## 2019-08-11 DIAGNOSIS — O0993 Supervision of high risk pregnancy, unspecified, third trimester: Secondary | ICD-10-CM

## 2019-08-11 DIAGNOSIS — M549 Dorsalgia, unspecified: Secondary | ICD-10-CM

## 2019-08-11 DIAGNOSIS — O0990 Supervision of high risk pregnancy, unspecified, unspecified trimester: Secondary | ICD-10-CM

## 2019-08-11 DIAGNOSIS — O26843 Uterine size-date discrepancy, third trimester: Secondary | ICD-10-CM

## 2019-08-11 DIAGNOSIS — Z3A36 36 weeks gestation of pregnancy: Secondary | ICD-10-CM | POA: Diagnosis present

## 2019-08-11 DIAGNOSIS — O99891 Other specified diseases and conditions complicating pregnancy: Secondary | ICD-10-CM

## 2019-08-11 NOTE — Patient Instructions (Signed)

## 2019-08-11 NOTE — Progress Notes (Signed)
ROB C/o pelvic pain  GBS/Aptima  Denies lof, no vb Good FM

## 2019-08-11 NOTE — Progress Notes (Signed)
Routine Prenatal Care Visit  Subjective  Jill [name] is a 33 y.o. G4P0030 at [redacted]w[redacted]d being seen today for ongoing prenatal care.  She is currently monitored for the following issues for this high-risk pregnancy and has Bipolar 2 disorder, major depressive episode (HCC); OCD (obsessive compulsive disorder); Insomnia due to mental disorder; Major depressive disorder, recurrent, severe without psychotic features (HCC); Dyspareunia in female; Menorrhagia with regular cycle; Dysmenorrhea; Pelvic pain in female; Endometriosis determined by laparoscopy; Supervision of high risk pregnancy, antepartum; BMI 40.0-44.9, adult (HCC); Bipolar 1 disorder (HCC); and Incomplete abortion on their problem list.  ----------------------------------------------------------------------------------- Patient reports backache.   Contractions: Not present. Vag. Bleeding: None.  Movement: Present. Denies leaking of fluid.  ----------------------------------------------------------------------------------- The following portions of the patient's history were reviewed and updated as appropriate: allergies, current medications, past family history, past medical history, past social history, past surgical history and problem list. Problem list updated.   Objective  Blood pressure 120/80, weight (!) 301 lb (136.5 kg), last menstrual period 11/30/2018. Pregravid weight 265 lb (120.2 kg) Total Weight Gain 36 lb (16.3 kg) Urinalysis:      Fetal Status: Fetal Heart Rate (bpm): 145 Fundal Height: 38 cm Movement: Present  Presentation: Vertex  General:  Alert, oriented and cooperative. Patient is in no acute distress.  Skin: Skin is warm and dry. No rash noted.   Cardiovascular: Normal heart rate noted  Respiratory: Normal respiratory effort, no problems with respiration noted  Abdomen: Soft, gravid, appropriate for gestational age. Pain/Pressure: Present     Pelvic:  Cervical exam deferred Dilation: Closed Effacement  (%): 0 Station: -3  Extremities: Normal range of motion.     Mental Status: Normal mood and affect. Normal behavior. Normal judgment and thought content.     Assessment   33 y.o. G4P0030 at [redacted]w[redacted]d by  09/06/2019, by Last Menstrual Period presenting for routine prenatal visit  Plan      Pregnancy #2 Problems (from 11/30/18 to present)    Problem Noted Resolved   Supervision of high risk pregnancy, antepartum 01/13/2018 by Conard Novak, MD No   Overview Addendum 06/30/2019  8:35 AM by Natale Milch, MD    [facility]inic [facility] Prenatal Labs  Dating  LMP = 10wk [place] Blood type: AB/Positive/-- (03/30 1144)   Genetic Screen  NIPS:Normal XY Antibody:Negative (03/30 1144)  Anatomic [place] complete Rubella: <0.90 (03/30 1144)  Varicella: Immune  GTT Early:106        Third trimester: 126 RPR: Non Reactive (08/26 0932)   Rhogam  not needed HBsAg: Negative (03/30 1144)   TDaP vaccine   06/30/2019                     Flu Shot:06/16/2019 HIV: Non Reactive (08/26 0932)   Baby Food   Breast                             GBS:   Contraception POP Pap: 2018 NIL  CBB     CS/VBAC N/A   Support Person            BMI 40.0-44.9, adult (HCC) 01/13/2018 by Conard Novak, MD No      Gestational age appropriate obstetric precautions including but not limited to vaginal bleeding, contractions, leaking of fluid and fetal movement were reviewed in detail with the patient.    Does not want to be out of work at this time.  Is having  difficulty walking, taking off pants and shoes. Would like PT referral, placed before but she has not had a call from them yet.  GBS/GC/CT today.   Return in about 1 week (around 08/18/2019) for [place] and [place] in person.  Homero Fellers MD [facility], [facility] 08/11/2019, 3:35 PM

## 2019-08-13 ENCOUNTER — Telehealth: Payer: Self-pay | Admitting: Obstetrics and Gynecology

## 2019-08-13 LAB — CERVICOVAGINAL ANCILLARY ONLY
Chlamydia: NEGATIVE
Comment: NEGATIVE
Comment: NORMAL
Neisseria Gonorrhea: NEGATIVE

## 2019-08-13 NOTE — Telephone Encounter (Signed)
Called and left message for [facility] physical therapy to call back with schedule appointment we are referring for .

## 2019-08-15 LAB — CULTURE, BETA STREP (GROUP B ONLY): Strep Gp B Culture: NEGATIVE

## 2019-08-15 NOTE — Progress Notes (Signed)
WNL- released to mychart

## 2019-08-16 NOTE — Telephone Encounter (Signed)
Will contact patient for appointment

## 2019-08-19 ENCOUNTER — Ambulatory Visit (INDEPENDENT_AMBULATORY_CARE_PROVIDER_SITE_OTHER): Payer: Managed Care, Other (non HMO) | Admitting: Certified Nurse Midwife

## 2019-08-19 ENCOUNTER — Emergency Department
Admission: EM | Admit: 2019-08-19 | Discharge: 2019-08-19 | Discharge status: Absent without leave | Payer: Managed Care, Other (non HMO) | Source: Home / Self Care

## 2019-08-19 ENCOUNTER — Inpatient Hospital Stay
Admission: EM | Admit: 2019-08-19 | Discharge: 2019-08-23 | DRG: 787 | Disposition: A | Discharge status: Home / Self Care | Payer: Managed Care, Other (non HMO) | Attending: Obstetrics and Gynecology | Admitting: Obstetrics and Gynecology

## 2019-08-19 ENCOUNTER — Other Ambulatory Visit: Payer: Self-pay

## 2019-08-19 ENCOUNTER — Ambulatory Visit: Payer: Managed Care, Other (non HMO)

## 2019-08-19 ENCOUNTER — Ambulatory Visit (INDEPENDENT_AMBULATORY_CARE_PROVIDER_SITE_OTHER): Payer: Managed Care, Other (non HMO)

## 2019-08-19 VITALS — BP 140/80 | Wt 310.0 lb

## 2019-08-19 DIAGNOSIS — Z3A37 37 weeks gestation of pregnancy: Secondary | ICD-10-CM

## 2019-08-19 DIAGNOSIS — O1412 Severe pre-eclampsia, second trimester: Secondary | ICD-10-CM | POA: Diagnosis not present

## 2019-08-19 DIAGNOSIS — Z98891 History of uterine scar from previous surgery: Secondary | ICD-10-CM

## 2019-08-19 DIAGNOSIS — O0990 Supervision of high risk pregnancy, unspecified, unspecified trimester: Secondary | ICD-10-CM

## 2019-08-19 DIAGNOSIS — Z6841 Body Mass Index (BMI) 40.0 and over, adult: Secondary | ICD-10-CM

## 2019-08-19 DIAGNOSIS — D62 Acute posthemorrhagic anemia: Secondary | ICD-10-CM | POA: Diagnosis not present

## 2019-08-19 DIAGNOSIS — O163 Unspecified maternal hypertension, third trimester: Secondary | ICD-10-CM

## 2019-08-19 DIAGNOSIS — O1410 Severe pre-eclampsia, unspecified trimester: Secondary | ICD-10-CM | POA: Diagnosis present

## 2019-08-19 DIAGNOSIS — O9081 Anemia of the puerperium: Secondary | ICD-10-CM | POA: Diagnosis not present

## 2019-08-19 DIAGNOSIS — O99214 Obesity complicating childbirth: Secondary | ICD-10-CM | POA: Diagnosis present

## 2019-08-19 DIAGNOSIS — O99213 Obesity complicating pregnancy, third trimester: Secondary | ICD-10-CM

## 2019-08-19 DIAGNOSIS — O26843 Uterine size-date discrepancy, third trimester: Secondary | ICD-10-CM | POA: Diagnosis not present

## 2019-08-19 DIAGNOSIS — O1414 Severe pre-eclampsia complicating childbirth: Principal | ICD-10-CM | POA: Diagnosis present

## 2019-08-19 DIAGNOSIS — Z20828 Contact with and (suspected) exposure to other viral communicable diseases: Secondary | ICD-10-CM | POA: Diagnosis present

## 2019-08-19 DIAGNOSIS — R030 Elevated blood-pressure reading, without diagnosis of hypertension: Secondary | ICD-10-CM | POA: Diagnosis present

## 2019-08-19 DIAGNOSIS — R600 Localized edema: Secondary | ICD-10-CM | POA: Diagnosis present

## 2019-08-19 DIAGNOSIS — O0993 Supervision of high risk pregnancy, unspecified, third trimester: Secondary | ICD-10-CM

## 2019-08-19 LAB — COMPREHENSIVE METABOLIC PANEL
ALT: 16 U/L (ref 0–44)
AST: 18 U/L (ref 15–41)
Albumin: 2.4 g/dL — ABNORMAL LOW (ref 3.5–5.0)
Alkaline Phosphatase: 119 U/L (ref 38–126)
Anion gap: 12 (ref 5–15)
BUN: <5 mg/dL — ABNORMAL LOW (ref 6–20)
CO2: 21 mmol/L — ABNORMAL LOW (ref 22–32)
Calcium: 8.9 mg/dL (ref 8.9–10.3)
Chloride: 106 mmol/L (ref 98–111)
Creatinine, Ser: 0.46 mg/dL (ref 0.44–1.00)
GFR calc Af Amer: >60 mL/min (ref >60)
GFR calc non Af Amer: >60 mL/min (ref >60)
Glucose, Bld: 85 mg/dL (ref 70–99)
Potassium: 3.2 mmol/L — ABNORMAL LOW (ref 3.5–5.1)
Sodium: 139 mmol/L (ref 135–145)
Total Bilirubin: 0.4 mg/dL (ref 0.3–1.2)
Total Protein: 6.3 g/dL — ABNORMAL LOW (ref 6.5–8.1)

## 2019-08-19 LAB — CBC
HCT: 33.2 % — ABNORMAL LOW (ref 36.0–46.0)
Hemoglobin: 11.3 g/dL — ABNORMAL LOW (ref 12.0–15.0)
MCH: 28.1 pg (ref 26.0–34.0)
MCHC: 34 g/dL (ref 30.0–36.0)
MCV: 82.6 fL (ref 80.0–100.0)
Platelets: 344 10*3/uL (ref 150–400)
RBC: 4.02 MIL/uL (ref 3.87–5.11)
RDW: 14.4 % (ref 11.5–15.5)
WBC: 12.5 10*3/uL — ABNORMAL HIGH (ref 4.0–10.5)
nRBC: 0 % (ref 0.0–0.2)

## 2019-08-19 LAB — POCT URINALYSIS DIPSTICK OB: Glucose, UA: NEGATIVE

## 2019-08-19 LAB — PROTEIN / CREATININE RATIO, URINE
Creatinine, Urine: 128 mg/dL
Protein Creatinine Ratio: 0.13 mg/mg{Cre} (ref 0.00–0.15)
Total Protein, Urine: 17 mg/dL

## 2019-08-19 LAB — TYPE AND SCREEN
ABO/RH(D): AB POS
Antibody Screen: NEGATIVE

## 2019-08-19 LAB — SARS CORONAVIRUS 2 BY RT PCR (HOSPITAL ORDER, PERFORMED IN ~~LOC~~ HOSPITAL LAB): SARS Coronavirus 2: NEGATIVE

## 2019-08-19 MED ORDER — ONDANSETRON HCL 4 MG/2ML IJ SOLN: 4.0000 mg | Freq: Four times a day (QID) | INTRAMUSCULAR | Status: DC | PRN

## 2019-08-19 MED ORDER — AMMONIA AROMATIC IN INHA
RESPIRATORY_TRACT | Status: AC
  Filled 2019-08-19: qty 10

## 2019-08-19 MED ORDER — LABETALOL HCL 5 MG/ML IV SOLN: 40.0000 mg | INTRAVENOUS | Status: DC | PRN

## 2019-08-19 MED ORDER — LIDOCAINE HCL (PF) 1 % IJ SOLN
INTRAMUSCULAR | Status: AC
  Filled 2019-08-19: qty 30

## 2019-08-19 MED ORDER — LACTATED RINGERS IV SOLN
INTRAVENOUS | Status: DC
  Administered 2019-08-19 – 2019-08-21 (×4): via INTRAVENOUS

## 2019-08-19 MED ORDER — OXYTOCIN 10 UNIT/ML IJ SOLN
INTRAMUSCULAR | Status: AC
  Filled 2019-08-19: qty 2

## 2019-08-19 MED ORDER — LABETALOL HCL 5 MG/ML IV SOLN: 80.0000 mg | INTRAVENOUS | Status: DC | PRN

## 2019-08-19 MED ORDER — MISOPROSTOL 25 MCG QUARTER TABLET
25.0000 ug | ORAL_TABLET | ORAL | Status: DC | PRN
  Administered 2019-08-20 (×3): 25 ug via VAGINAL
  Filled 2019-08-19 (×4): qty 1

## 2019-08-19 MED ORDER — ACETAMINOPHEN 500 MG PO TABS
1000.0000 mg | ORAL_TABLET | Freq: Once | ORAL | Status: AC
  Administered 2019-08-19: 20:00:00 1000 mg via ORAL
  Filled 2019-08-19: qty 2

## 2019-08-19 MED ORDER — LIDOCAINE HCL (PF) 1 % IJ SOLN
30.0000 mL | INTRAMUSCULAR | Status: AC | PRN
  Administered 2019-08-20: 1.5 mL via SUBCUTANEOUS

## 2019-08-19 MED ORDER — LACTATED RINGERS IV SOLN
500.0000 mL | INTRAVENOUS | Status: DC | PRN
  Administered 2019-08-20: 17:00:00 500 mL via INTRAVENOUS

## 2019-08-19 MED ORDER — TERBUTALINE SULFATE 1 MG/ML IJ SOLN: 0.2500 mg | Freq: Once | INTRAMUSCULAR | Status: DC | PRN

## 2019-08-19 MED ORDER — BUTORPHANOL TARTRATE 1 MG/ML IJ SOLN: 1.0000 mg | INTRAMUSCULAR | Status: DC | PRN

## 2019-08-19 MED ORDER — MAGNESIUM SULFATE BOLUS VIA INFUSION
4.0000 g | Freq: Once | INTRAVENOUS | Status: AC
  Administered 2019-08-19: 4 g via INTRAVENOUS
  Filled 2019-08-19: qty 1000

## 2019-08-19 MED ORDER — ACETAMINOPHEN 325 MG PO TABS
650.0000 mg | ORAL_TABLET | ORAL | Status: DC | PRN
  Administered 2019-08-20: 650 mg via ORAL
  Filled 2019-08-19: qty 2

## 2019-08-19 MED ORDER — OXYTOCIN 40 UNITS IN NORMAL SALINE INFUSION - SIMPLE MED
2.5000 [IU]/h | INTRAVENOUS | Status: DC
  Administered 2019-08-21: 2.5 [IU]/h via INTRAVENOUS
  Filled 2019-08-19 (×2): qty 1000

## 2019-08-19 MED ORDER — OXYTOCIN BOLUS FROM INFUSION: 500.0000 mL | Freq: Once | INTRAVENOUS | Status: DC

## 2019-08-19 MED ORDER — SOD CITRATE-CITRIC ACID 500-334 MG/5ML PO SOLN
30.0000 mL | ORAL | Status: DC | PRN
  Administered 2019-08-21: 11:00:00 30 mL via ORAL
  Filled 2019-08-19: qty 30

## 2019-08-19 MED ORDER — MAGNESIUM SULFATE 40 GM/1000ML IV SOLN
2.0000 g/h | INTRAVENOUS | Status: DC
  Administered 2019-08-20 – 2019-08-21 (×2): 2 g/h via INTRAVENOUS
  Filled 2019-08-19 (×3): qty 1000

## 2019-08-19 MED ORDER — LABETALOL HCL 5 MG/ML IV SOLN: 20.0000 mg | INTRAVENOUS | Status: DC | PRN

## 2019-08-19 MED ORDER — MISOPROSTOL 200 MCG PO TABS
ORAL_TABLET | ORAL | Status: AC
  Administered 2019-08-20: 25 ug via VAGINAL
  Filled 2019-08-19: qty 4

## 2019-08-19 MED ORDER — PROCHLORPERAZINE MALEATE 10 MG PO TABS
10.0000 mg | ORAL_TABLET | Freq: Once | ORAL | Status: AC
  Administered 2019-08-19: 10 mg via ORAL
  Filled 2019-08-19 (×2): qty 1

## 2019-08-19 NOTE — Progress Notes (Signed)
HROB at 37wk3d: COmplains of increased edema in legs, hands, face. Headaches x 2 weeks.  Has had frontal and sometimes temporal headache unrelieved with Tylenol. Does not feel like migraine headaches. Last Tylenol at 10 AM.Feels lightheaded and having blurred vision. Some irregular contractions and pelvic pain/ pressure increasing. Baby moving well. No vaginal bleeding or leakage of water BP 140/80 with trace proteinuria. +1 to +2 pitting edema up to knees bilaterally Heart: AP 100, regular rhythm, no murmur Lungs: CTAB, normal respiratory effort Cervix: closed/40%/ -3  Ultrasound today: EFW 6#13oz (47.4%) with AFI 9.2ZR/ cephalic  A: Elevated blood pressure and headache/ blurred vision/ edema  P: TO L&D for preeclampsia labs/ NST/ serial blood pressures  Dalia Heading, CNM

## 2019-08-19 NOTE — Progress Notes (Signed)
C/o swollen ankles, pelvic pain, dizzy spells. rj

## 2019-08-19 NOTE — OB Triage Note (Signed)
Pt sent over for [facility] workup from MD office. Edema in bilateral lower limbs, short of breath with exertion. Still reports baby movement

## 2019-08-19 NOTE — H&P (Signed)
Obstetric H&P   Chief Complaint: Elevated BP and headache  Prenatal Care Provider: [facility]  History of Present Illness: 33 y.o. G4P0030 21w3dby 09/06/2019, by Last Menstrual Period = 10 week [place] to L&[place] for elevated blood pressure in clinic and headache.  She has a history of migraines.  This headache has not improved after 10030mof Tylenol and 1029mf compazine.  No visual changes, RUQ or epigastric pain.  She does reports increased edema and was noted to have a 10lbs weight gain in the past week.  No LOF, no VB, no ctx, +FM.  Pregnancy complicated by morbid obesity Body mass index is 45.78 kg/m.  Growth scan 08/19/2019 37w63w3d7g (6lbs 13oz) c/w 47.4%ile, AFI 9.4cm  Pregravid weight 120.2 kg Total Weight Gain 20.4 kg  Pregnancy #2 Problems (from 11/30/18 to present)    Problem Noted Resolved   Supervision of high risk pregnancy, antepartum 01/13/2018 by JackWill Bonnet No   Overview Addendum 08/19/2019  9:52 PM by GutiDalia HeadingM [facility]  Dating  LMP = 10wk US BKoreaod type: AB/Positive/-- (03/30 1144)   Genetic Screen  NIPS:Normal XY Antibody:Negative (03/30 1144)  Anatomic US c[place]oreaplete Rubella: <0.90 (03/30 1144)  Varicella: Immune  GTT Early:106        Third trimester: 126 RPR: Non Reactive (08/26 0932)   Rhogam  not needed HBsAg: Negative (03/30 1144)   TDaP vaccine   06/30/2019                     Flu Shot:06/16/2019 HIV: Non Reactive (08/26 0932)   Baby Food   Breast                             GBS: negative  Contraception  Pap: 2018 NIL  CBB     CS/VBAC    Support Person            BMI 40.0-44.9, adult (HCC)[place]9/2019 by JackWill Bonnet No       Review of Systems: 10 point review of systems negative unless otherwise noted in HPI  Past Medical History: Past Medical History:  Diagnosis Date  . Bipolar 1 disorder (HCC)[place]. Depression   . Migraine   . OCD (obsessive compulsive disorder)   . TMJ (dislocation of  temporomandibular joint)     Past Surgical History: Past Surgical History:  Procedure Laterality Date  . DILATION AND EVACUATION N/A 03/03/2018   Procedure: DILATATION AND EVACUATION;  Surgeon: HarrGae Dry;  Location: [facility] ORS;  Service: Gynecology;  Laterality: N/A;  . LAPAROSCOPIC LYSIS OF ADHESIONS  03/21/2017   Procedure: LAPAROSCOPIC LYSIS OF ADHESIONS;  Surgeon: JackWill Bonnet;  Location: [facility] ORS;  Service: Gynecology;;  . LAPAROSCOPY N/A 03/21/2017   Procedure: LAPAROSCOPY DIAGNOSTIC/FULGERATION OF ENDOMETRIAL IMPLANTS;  Surgeon: JackWill Bonnet;  Location: [facility] ORS;  Service: Gynecology;  Laterality: N/A;  . None      Past Obstetric History: # 1 - Date: 2005, Sex: None, Weight: None, GA: None, Delivery: None, Apgar1: None, Apgar5: None, Living: None, Birth Comments: None  # 2 - Date: 2010, Sex: None, Weight: None, GA: None, Delivery: None, Apgar1: None, Apgar5: None, Living: None, Birth Comments: None  # 3 - Date: 03/2018, Sex: None, Weight: None, GA: None, Delivery: None, Apgar1: None, Apgar5: None, Living: None, Birth Comments: None  # 4 - Date:  None, Sex: None, Weight: None, GA: None, Delivery: None, Apgar1: None, Apgar5: None, Living: None, Birth Comments: None   Past Gynecologic History:  Family History: Family History  Problem Relation Age of Onset  . Depression Mother   . Alcohol abuse Father   . Lung cancer Maternal Grandmother   . Hypertension Maternal Grandmother   . Hypertension Maternal Grandfather   . Stroke Maternal Grandfather   . Hypertension Paternal Grandmother   . Hypertension Paternal Grandfather   . Breast cancer Neg Hx     Social History: Social History   Socioeconomic History  . Marital status: Married    Spouse name: Thurmond Butts  . Number of children: Not on file  . Years of education: Not on file  . Highest education level: Not on file  Occupational History  . Occupation: [employer]  Social Needs  . Financial  resource strain: Not on file  . Food insecurity    Worry: Not on file    Inability: Not on file  . Transportation needs    Medical: Not on file    Non-medical: Not on file  Tobacco Use  . Smoking status: Never Smoker  . Smokeless tobacco: Never Used  Substance and Sexual Activity  . Alcohol use: No    Alcohol/week: 0.0 standard drinks  . Drug use: No  . Sexual activity: Yes    Partners: Male    Birth control/protection: None  Lifestyle  . Physical activity    Days per week: Not on file    Minutes per session: Not on file  . Stress: Not on file  Relationships  . Social Herbalist on phone: Not on file    Gets together: Not on file    Attends religious service: Not on file    Active member of club or organization: Not on file    Attends meetings of clubs or organizations: Not on file    Relationship status: Not on file  . Intimate partner violence    Fear of current or ex partner: Not on file    Emotionally abused: Not on file    Physically abused: Not on file    Forced sexual activity: Not on file  Other Topics Concern  . Not on file  Social History Narrative   She is originally from [place] and graduated high school in [place]. She has been married for approximately 2years and has no children. She has been working for [employer] for the past one month. She currently lives with her husband. She denies any physical or sexual abuse.    Medications: Prior to Admission medications   Medication Sig Start Date End Date Taking? Authorizing Provider  omeprazole (PRILOSEC) 20 MG capsule Take 1 capsule (20 mg total) by mouth daily. Patient not taking: Reported on 07/28/2019 06/30/19   Homero Fellers, MD  Prenatal Vit-Fe Fumarate-FA (MULTIVITAMIN-PRENATAL) 27-0.8 MG TABS tablet Take 1 tablet by mouth daily at 12 noon.    [provider]  promethazine (PHENERGAN) 25 MG tablet Take 1 tablet (25 mg total) by mouth every 6 (six) hours as needed for nausea or  vomiting. 07/28/19   Rexene Agent, CNM  sertraline (ZOLOFT) 50 MG tablet Take 50 mg by mouth daily.    [provider]  sucralfate (CARAFATE) 1 g tablet Take 1 tablet (1 g total) by mouth 4 (four) times daily -  with meals and at bedtime. Patient not taking: Reported on 07/28/2019 06/16/19   Homero Fellers, MD  Allergies: Allergies  Allergen Reactions  . Amoxicillin Anaphylaxis, Shortness Of Breath and Rash    Childhood reaction  Has patient had a PCN reaction causing immediate rash, facial/tongue/throat swelling, SOB or lightheadedness with hypotension: Yes Has patient had a PCN reaction causing severe rash involving mucus membranes or skin necrosis: Unknown Has patient had a PCN reaction that required hospitalization: No Has patient had a PCN reaction occurring within the last 10 years: No If all of the above answers are "NO", then may proceed with Cephalosporin use.  Marland Kitchen Penicillins     Pt does not think that she is allergic to this medication but is not sure     Physical Exam: Vitals: Blood pressure 133/84, pulse 87, last menstrual period 11/30/2018.  Urine Dip Protein: negative P/C ratio  FHT: 140, moderate, +accels, no decels Toco: none  General: NAD HEENT: normocephalic, anicteric Pulmonary: No increased work of breathing Cardiovascular: RRR, distal pulses 2+ Abdomen: Gravid, non-tender [place]:vtx Genitourinary: closed in clinic today Extremities: no edema, erythema, or tenderness Neurologic: Grossly intact Psychiatric: mood appropriate, affect full  Labs: Results for orders placed or performed during the hospital encounter of 08/19/19 (from the past 24 hour(s))  Protein / creatinine ratio, urine     Status: None   Collection Time: 08/19/19  7:14 PM  Result Value Ref Range   Creatinine, Urine 128 mg/dL   Total Protein, Urine 17 mg/dL   Protein Creatinine Ratio 0.13 0.00 - 0.15 mg/mg[Cre]  CBC     Status: Abnormal   Collection Time: 08/19/19   7:22 PM  Result Value Ref Range   WBC 12.5 (H) 4.0 - 10.5 K/uL   RBC 4.02 3.87 - 5.11 MIL/uL   Hemoglobin 11.3 (L) 12.0 - 15.0 g/dL   HCT 33.2 (L) 36.0 - 46.0 %   MCV 82.6 80.0 - 100.0 fL   MCH 28.1 26.0 - 34.0 pg   MCHC 34.0 30.0 - 36.0 g/dL   RDW 14.4 11.5 - 15.5 %   Platelets 344 150 - 400 K/uL   nRBC 0.0 0.0 - 0.2 %  Comprehensive metabolic panel     Status: Abnormal   Collection Time: 08/19/19  7:22 PM  Result Value Ref Range   Sodium 139 135 - 145 mmol/L   Potassium 3.2 (L) 3.5 - 5.1 mmol/L   Chloride 106 98 - 111 mmol/L   CO2 21 (L) 22 - 32 mmol/L   Glucose, Bld 85 70 - 99 mg/dL   BUN <5 (L) 6 - 20 mg/dL   Creatinine, Ser 0.46 0.44 - 1.00 mg/dL   Calcium 8.9 8.9 - 10.3 mg/dL   Total Protein 6.3 (L) 6.5 - 8.1 g/dL   Albumin 2.4 (L) 3.5 - 5.0 g/dL   AST 18 15 - 41 U/L   ALT 16 0 - 44 U/L   Alkaline Phosphatase 119 38 - 126 U/L   Total Bilirubin 0.4 0.3 - 1.2 mg/dL   GFR calc non Af Amer >60 >60 mL/min   GFR calc Af Amer >60 >60 mL/min   Anion gap 12 5 - 15    Assessment: 33 y.o. G4P0030 32w3dby 09/06/2019, by Last Menstrual Period = 10 week [place] preeeclampsia with severe feature based on neurologic criteria  Plan: 1) Preeclampsia with severe features based on neurologic criteria - intermittent mild range blood pressures with headache unresponsive to medical management and 10lbs weight gain.  Labs otherwise normal.   - Cytotec IOL - Magnesium for seizure ppx  2) Fetus -  cat I tracing  3) PNL - Blood type AB/Positive/-- (03/30 1144) / Anti-bodyscreen Negative (03/30 1144) / Rubella <0.90 (03/30 1144) / Varicella Immune / RPR Non Reactive (08/26 0932) / HBsAg Negative (03/30 1144) / HIV Non Reactive (08/26 0932) / 1-hr OGTT 126 / GBS Negative/-- (11/04 1638)  - MMR postpartum  4) Immunization History -  Immunization History  Administered Date(s) Administered  . Influenza,inj,Quad PF,6+ Mos 06/16/2019  . Tdap 06/30/2019    5) Disposition - pending delivery   Malachy Mood, MD, [place], [facility] 08/19/2019, 10:43 PM

## 2019-08-20 ENCOUNTER — Inpatient Hospital Stay: Payer: Managed Care, Other (non HMO) | Admitting: Anesthesiology

## 2019-08-20 LAB — RPR: RPR Ser Ql: NONREACTIVE

## 2019-08-20 MED ORDER — MAGNESIUM SULFATE 40 GM/1000ML IV SOLN
INTRAVENOUS | Status: AC
  Filled 2019-08-20: qty 1000

## 2019-08-20 MED ORDER — EPHEDRINE 5 MG/ML INJ
10.0000 mg | INTRAVENOUS | Status: AC | PRN
  Administered 2019-08-20: 23:00:00 10 mg via INTRAVENOUS
  Administered 2019-08-20: 5 mg via INTRAVENOUS
  Filled 2019-08-20: qty 4

## 2019-08-20 MED ORDER — BUTALBITAL-APAP-CAFFEINE 50-325-40 MG PO TABS
2.0000 | ORAL_TABLET | Freq: Four times a day (QID) | ORAL | Status: DC | PRN
  Administered 2019-08-20: 2 via ORAL
  Filled 2019-08-20: qty 2

## 2019-08-20 MED ORDER — TERBUTALINE SULFATE 1 MG/ML IJ SOLN: 0.2500 mg | Freq: Once | INTRAMUSCULAR | Status: DC | PRN

## 2019-08-20 MED ORDER — FENTANYL 2.5 MCG/ML W/ROPIVACAINE 0.15% IN NS 100 ML EPIDURAL (ARMC)
EPIDURAL | Status: AC
  Administered 2019-08-20: 18:00:00 12 mL/h via EPIDURAL
  Filled 2019-08-20: qty 100

## 2019-08-20 MED ORDER — EPHEDRINE 5 MG/ML INJ
INTRAVENOUS | Status: AC
  Filled 2019-08-20: qty 4

## 2019-08-20 MED ORDER — PHENYLEPHRINE 40 MCG/ML (10ML) SYRINGE FOR IV PUSH (FOR BLOOD PRESSURE SUPPORT)
PREFILLED_SYRINGE | INTRAVENOUS | Status: AC
  Administered 2019-08-20: 80 ug via INTRAVENOUS
  Filled 2019-08-20: qty 10

## 2019-08-20 MED ORDER — DIPHENHYDRAMINE HCL 50 MG/ML IJ SOLN
12.5000 mg | INTRAMUSCULAR | Status: DC | PRN
  Administered 2019-08-20: 12.5 mg via INTRAVENOUS
  Filled 2019-08-20: qty 1

## 2019-08-20 MED ORDER — LIDOCAINE-EPINEPHRINE (PF) 1.5 %-1:200000 IJ SOLN
INTRAMUSCULAR | Status: DC | PRN
  Administered 2019-08-20: 3 mL via EPIDURAL

## 2019-08-20 MED ORDER — EPHEDRINE 5 MG/ML INJ
10.0000 mg | INTRAVENOUS | Status: AC | PRN
  Administered 2019-08-20 (×2): 10 mg via INTRAVENOUS

## 2019-08-20 MED ORDER — FENTANYL 2.5 MCG/ML W/ROPIVACAINE 0.15% IN NS 100 ML EPIDURAL (ARMC)
12.0000 mL/h | EPIDURAL | Status: DC
  Administered 2019-08-20 – 2019-08-21 (×3): 12 mL/h via EPIDURAL
  Filled 2019-08-20 (×2): qty 100

## 2019-08-20 MED ORDER — PHENYLEPHRINE 40 MCG/ML (10ML) SYRINGE FOR IV PUSH (FOR BLOOD PRESSURE SUPPORT)
80.0000 ug | PREFILLED_SYRINGE | INTRAVENOUS | Status: DC | PRN
  Administered 2019-08-20 (×2): 40 ug via INTRAVENOUS

## 2019-08-20 MED ORDER — FENTANYL 2.5 MCG/ML W/ROPIVACAINE 0.15% IN NS 100 ML EPIDURAL (ARMC)
EPIDURAL | Status: DC | PRN
  Administered 2019-08-20: 12 mL/h via EPIDURAL

## 2019-08-20 MED ORDER — PHENYLEPHRINE 40 MCG/ML (10ML) SYRINGE FOR IV PUSH (FOR BLOOD PRESSURE SUPPORT)
80.0000 ug | PREFILLED_SYRINGE | INTRAVENOUS | Status: DC | PRN
  Administered 2019-08-20: 17:00:00 80 ug via INTRAVENOUS
  Filled 2019-08-20: qty 10

## 2019-08-20 MED ORDER — OXYTOCIN 40 UNITS IN NORMAL SALINE INFUSION - SIMPLE MED
1.0000 m[IU]/min | INTRAVENOUS | Status: DC
  Administered 2019-08-20: 4 m[IU]/min via INTRAVENOUS
  Administered 2019-08-20: 2 m[IU]/min via INTRAVENOUS

## 2019-08-20 MED ORDER — LACTATED RINGERS IV SOLN
500.0000 mL | Freq: Once | INTRAVENOUS | Status: AC
  Administered 2019-08-20: 500 mL via INTRAVENOUS

## 2019-08-20 MED ORDER — CALCIUM GLUCONATE 10 % IV SOLN
INTRAVENOUS | Status: AC
  Filled 2019-08-20: qty 10

## 2019-08-20 MED ORDER — FENTANYL 2.5 MCG/ML W/ROPIVACAINE 0.15% IN NS 100 ML EPIDURAL (ARMC)
EPIDURAL | Status: AC
  Filled 2019-08-20: qty 100

## 2019-08-20 NOTE — Progress Notes (Signed)
Fetal deceleration from [phone] after epidural placement and bolus by ANMD Kephart. At 1706 pt BP 140/79 while sitting. At 1713 pt BP 89/48. Ronelle Nigh, ANMD at bedside and aware. At [place] BP 76/30. Kephart, ANMD at bedside, verbal order for 5mg  of ephedrine given and pt repositioned to the left side @ 1716. Verbal order for 10mg  of ephedrine by Kephart, ANMD. Pt received 10mg  of ephedrine @1721 . Retook BP 86/33. Cari Caraway, MD phoned and aware of the deceleration. Retook BP @ 1727 95/44. Verbal order for 80mg  of phenylephrine by [place], ANMD. Pt given 80 mg of phenylephedrine @1723 . At 1724  received a 500 mL LR bolus. Verbal order for 40 mg of phenylephedrine from [place], ANMD at bedside. Pt given 40mg  phenylephedrine and repositioned to the right side at  1724. BP 103/54. Kephart, ANMD at bedside until 1750. C. Schuman, MD at bedside at 1800 aware of deceleration.

## 2019-08-20 NOTE — Anesthesia Preprocedure Evaluation (Addendum)
Anesthesia Evaluation  Patient identified by MRN, date of birth, ID band Patient awake    Reviewed: Allergy & Precautions, NPO status , Patient's Chart, lab work & pertinent test results  History of Anesthesia Complications Negative for: history of anesthetic complications  Airway Mallampati: III       Dental   Pulmonary neg sleep apnea, neg COPD, Not current smoker,           Cardiovascular hypertension (pre-eclampsia), (-) Past MI and (-) CHF (-) dysrhythmias (-) Valvular Problems/Murmurs     Neuro/Psych  Headaches, neg Seizures PSYCHIATRIC DISORDERS Anxiety Depression Bipolar Disorder    GI/Hepatic Neg liver ROS, GERD (with pregnancy)  ,  Endo/Other  neg diabetesMorbid obesity  Renal/GU negative Renal ROS     Musculoskeletal   Abdominal   Peds  Hematology   Anesthesia Other Findings Past Medical History: No date: Bipolar 1 disorder (HCC) No date: Depression No date: Migraine No date: OCD (obsessive compulsive disorder) No date: TMJ (dislocation of temporomandibular joint)  BMI    Body Mass Index: 45.78 kg/m      Reproductive/Obstetrics                            Anesthesia Physical Anesthesia Plan  ASA: III  Anesthesia Plan: Epidural   Post-op Pain Management:    Induction:   PONV Risk Score and Plan:   Airway Management Planned:   Additional Equipment:   Intra-op Plan:   Post-operative Plan:   Informed Consent: I have reviewed the patients History and Physical, chart, labs and discussed the procedure including the risks, benefits and alternatives for the proposed anesthesia with the patient or authorized representative who has indicated his/her understanding and acceptance.       Plan Discussed with:   Anesthesia Plan Comments:         Anesthesia Quick Evaluation

## 2019-08-20 NOTE — Progress Notes (Signed)
  Labor Progress Note   33 y.o. G4P0030 @ [redacted]w[redacted]d , admitted for  Pregnancy, Labor Management.   Subjective:  Complaint of continued headache- about to take Fioricet as ordered. She feels mild contractions.   Objective:  BP (!) 138/93 (BP Location: Right Arm)   Pulse 95   Temp 98.1 F (36.7 C) (Oral)   Resp 18   Ht 5\' 9"  (1.753 m)   Wt (!) 140.6 kg   LMP 11/30/2018 (Exact Date)   SpO2 96%   BMI 45.78 kg/m  Abd: gravid, ND, FHT present, mild tenderness on exam Extr: trace to 1+ bilateral pedal edema SVE: CERVIX: 1.5 cm dilated, 70-80 effaced, -2 station  EFM: FHR: 120 bpm, variability: moderate,  accelerations:  Present,  decelerations:  Absent Toco: Frequency: Every 1-4 minutes Labs: I have reviewed the patient's lab results.   Assessment & Plan:  [id number] @ [redacted]w[redacted]d, admitted for  Pregnancy and Labor/Delivery Management  1. Pain management: none. 2. FWB: FHT category I.  3. ID: GBS negative 4. Labor management: start pitocin  All discussed with patient, see orders   Rod Can, [facility] 08/20/2019  12:35 PM

## 2019-08-20 NOTE — Anesthesia Procedure Notes (Signed)
Epidural Patient location during procedure: OB Start time: 08/20/2019 4:58 PM End time: 08/20/2019 5:34 PM  Staffing Performed: anesthesiologist   Preanesthetic Checklist Completed: patient identified, site marked, surgical consent, pre-op evaluation, timeout performed, IV checked, risks and benefits discussed and monitors and equipment checked  Epidural Patient position: sitting Prep: Betadine Patient monitoring: heart rate, continuous pulse ox and blood pressure Approach: midline Location: L4-L5 Injection technique: LOR saline  Needle:  Needle type: Tuohy  Needle gauge: 17 G Needle length: 9 cm and 9 Needle insertion depth: 8 cm Catheter type: closed end flexible Catheter size: 20 Guage Catheter at skin depth: 13 cm Test dose: negative and 1.5% lidocaine with Epi 1:200 K  Assessment Events: blood not aspirated, injection not painful, no injection resistance, negative IV test and no paresthesia  Additional Notes   Patient tolerated the insertion well without complications.Reason for block:procedure for pain

## 2019-08-20 NOTE — Progress Notes (Signed)
Cari Caraway MD reviewed strip, verbal order to initiate Pitcoin 4 milliunits/min. Restarted at 2345. Will continue to monitor.

## 2019-08-20 NOTE — Progress Notes (Signed)
Cari Caraway, MD reviewed strip and aware of decel from [phone]. Per MD order, restart pitocin at 26mu. Pit restarted at 1830.

## 2019-08-21 ENCOUNTER — Encounter
Admission: EM | Disposition: A | Discharge status: Home / Self Care | Payer: Self-pay | Source: Home / Self Care | Attending: Obstetrics and Gynecology

## 2019-08-21 ENCOUNTER — Encounter: Payer: Self-pay | Admitting: Anesthesiology

## 2019-08-21 DIAGNOSIS — O1414 Severe pre-eclampsia complicating childbirth: Secondary | ICD-10-CM

## 2019-08-21 DIAGNOSIS — Z98891 History of uterine scar from previous surgery: Secondary | ICD-10-CM

## 2019-08-21 LAB — CREATININE, SERUM
Creatinine, Ser: 0.63 mg/dL (ref 0.44–1.00)
GFR calc Af Amer: >60 mL/min (ref >60)
GFR calc non Af Amer: >60 mL/min (ref >60)

## 2019-08-21 LAB — CBC
HCT: 31.1 % — ABNORMAL LOW (ref 36.0–46.0)
Hemoglobin: 10.6 g/dL — ABNORMAL LOW (ref 12.0–15.0)
MCH: 28 pg (ref 26.0–34.0)
MCHC: 34.1 g/dL (ref 30.0–36.0)
MCV: 82.3 fL (ref 80.0–100.0)
Platelets: 323 10*3/uL (ref 150–400)
RBC: 3.78 MIL/uL — ABNORMAL LOW (ref 3.87–5.11)
RDW: 14.7 % (ref 11.5–15.5)
WBC: 21.2 10*3/uL — ABNORMAL HIGH (ref 4.0–10.5)
nRBC: 0 % (ref 0.0–0.2)

## 2019-08-21 SURGERY — Surgical Case
Anesthesia: Epidural

## 2019-08-21 MED ORDER — MEPERIDINE HCL 25 MG/ML IJ SOLN: 6.2500 mg | INTRAMUSCULAR | Status: DC | PRN

## 2019-08-21 MED ORDER — BUPIVACAINE 0.25 % ON-Q PUMP DUAL CATH 400 ML
400.0000 mL | INJECTION | Status: DC
  Filled 2019-08-21: qty 400

## 2019-08-21 MED ORDER — PRENATAL MULTIVITAMIN CH
1.0000 | ORAL_TABLET | Freq: Every day | ORAL | Status: DC
  Administered 2019-08-22 – 2019-08-23 (×2): 1 via ORAL
  Filled 2019-08-21 (×2): qty 1

## 2019-08-21 MED ORDER — PHENYLEPHRINE HCL (PRESSORS) 10 MG/ML IV SOLN
INTRAVENOUS | Status: AC
  Filled 2019-08-21: qty 1

## 2019-08-21 MED ORDER — LIDOCAINE HCL (PF) 2 % IJ SOLN
INTRAMUSCULAR | Status: DC | PRN
  Administered 2019-08-21 (×2): 100 mg via INTRADERMAL
  Administered 2019-08-21: 100 mg via EPIDURAL
  Administered 2019-08-21: 100 mg via INTRADERMAL
  Administered 2019-08-21: 100 mg via EPIDURAL

## 2019-08-21 MED ORDER — BUPIVACAINE HCL (PF) 0.5 % IJ SOLN
INTRAMUSCULAR | Status: DC | PRN
  Administered 2019-08-21: 10 mL

## 2019-08-21 MED ORDER — DIPHENHYDRAMINE HCL 25 MG PO CAPS: 25.0000 mg | ORAL_CAPSULE | Freq: Four times a day (QID) | ORAL | Status: DC | PRN

## 2019-08-21 MED ORDER — SOD CITRATE-CITRIC ACID 500-334 MG/5ML PO SOLN: 30.0000 mL | ORAL | Status: DC

## 2019-08-21 MED ORDER — FENTANYL CITRATE (PF) 100 MCG/2ML IJ SOLN: 25.0000 ug | INTRAMUSCULAR | Status: DC | PRN

## 2019-08-21 MED ORDER — NALOXONE HCL 4 MG/10ML IJ SOLN
1.0000 ug/kg/h | INTRAVENOUS | Status: DC | PRN
  Filled 2019-08-21: qty 5

## 2019-08-21 MED ORDER — FENTANYL CITRATE (PF) 100 MCG/2ML IJ SOLN
INTRAMUSCULAR | Status: DC | PRN
  Administered 2019-08-21: 100 ug via EPIDURAL

## 2019-08-21 MED ORDER — NALBUPHINE HCL 10 MG/ML IJ SOLN: 5.0000 mg | INTRAMUSCULAR | Status: DC | PRN

## 2019-08-21 MED ORDER — EPHEDRINE SULFATE-NACL 50-0.9 MG/10ML-% IV SOSY: PREFILLED_SYRINGE | INTRAVENOUS | Status: DC | PRN

## 2019-08-21 MED ORDER — SODIUM CHLORIDE 0.9% FLUSH: 3.0000 mL | INTRAVENOUS | Status: DC | PRN

## 2019-08-21 MED ORDER — SIMETHICONE 80 MG PO CHEW
80.0000 mg | CHEWABLE_TABLET | Freq: Three times a day (TID) | ORAL | Status: DC
  Administered 2019-08-22 – 2019-08-23 (×5): 80 mg via ORAL
  Filled 2019-08-21 (×5): qty 1

## 2019-08-21 MED ORDER — MENTHOL 3 MG MT LOZG
1.0000 | LOZENGE | OROMUCOSAL | Status: DC | PRN
  Filled 2019-08-21: qty 9

## 2019-08-21 MED ORDER — MEASLES, MUMPS & RUBELLA VAC IJ SOLR
0.5000 mL | Freq: Once | INTRAMUSCULAR | Status: DC
  Filled 2019-08-21: qty 0.5

## 2019-08-21 MED ORDER — NALOXONE HCL 0.4 MG/ML IJ SOLN: 0.4000 mg | INTRAMUSCULAR | Status: DC | PRN

## 2019-08-21 MED ORDER — DIPHENHYDRAMINE HCL 50 MG/ML IJ SOLN: 12.5000 mg | INTRAMUSCULAR | Status: DC | PRN

## 2019-08-21 MED ORDER — LIDOCAINE HCL (PF) 2 % IJ SOLN
INTRAMUSCULAR | Status: AC
  Filled 2019-08-21: qty 40

## 2019-08-21 MED ORDER — COCONUT OIL OIL
1.0000 "application " | TOPICAL_OIL | Status: DC | PRN
  Filled 2019-08-21: qty 120

## 2019-08-21 MED ORDER — SENNOSIDES-DOCUSATE SODIUM 8.6-50 MG PO TABS
2.0000 | ORAL_TABLET | ORAL | Status: DC
  Administered 2019-08-22 – 2019-08-23 (×2): 2 via ORAL
  Filled 2019-08-21 (×2): qty 2

## 2019-08-21 MED ORDER — OXYCODONE HCL 5 MG/5ML PO SOLN
5.0000 mg | Freq: Once | ORAL | Status: DC | PRN
  Filled 2019-08-21: qty 5

## 2019-08-21 MED ORDER — ONDANSETRON HCL 4 MG/2ML IJ SOLN: 4.0000 mg | Freq: Three times a day (TID) | INTRAMUSCULAR | Status: DC | PRN

## 2019-08-21 MED ORDER — FENTANYL CITRATE (PF) 100 MCG/2ML IJ SOLN
INTRAMUSCULAR | Status: AC
  Filled 2019-08-21: qty 2

## 2019-08-21 MED ORDER — OXYCODONE-ACETAMINOPHEN 5-325 MG PO TABS: 2.0000 | ORAL_TABLET | ORAL | Status: DC | PRN

## 2019-08-21 MED ORDER — OXYCODONE HCL 5 MG PO TABS: 5.0000 mg | ORAL_TABLET | Freq: Once | ORAL | Status: DC | PRN

## 2019-08-21 MED ORDER — BUPIVACAINE HCL (PF) 0.5 % IJ SOLN: 5.0000 mL | Freq: Once | INTRAMUSCULAR | Status: DC

## 2019-08-21 MED ORDER — FERROUS SULFATE 325 (65 FE) MG PO TABS
325.0000 mg | ORAL_TABLET | Freq: Two times a day (BID) | ORAL | Status: DC
  Administered 2019-08-22 – 2019-08-23 (×3): 325 mg via ORAL
  Filled 2019-08-21 (×3): qty 1

## 2019-08-21 MED ORDER — PHENYLEPHRINE HCL (PRESSORS) 10 MG/ML IV SOLN
INTRAVENOUS | Status: DC | PRN
  Administered 2019-08-21 (×2): 100 ug via INTRAVENOUS
  Administered 2019-08-21: 200 ug via INTRAVENOUS
  Administered 2019-08-21 (×3): 100 ug via INTRAVENOUS
  Administered 2019-08-21 (×2): 200 ug via INTRAVENOUS

## 2019-08-21 MED ORDER — BUPIVACAINE HCL (PF) 0.5 % IJ SOLN
INTRAMUSCULAR | Status: AC
  Filled 2019-08-21: qty 30

## 2019-08-21 MED ORDER — NALBUPHINE HCL 10 MG/ML IJ SOLN: 5.0000 mg | Freq: Once | INTRAMUSCULAR | Status: DC | PRN

## 2019-08-21 MED ORDER — ENOXAPARIN SODIUM 30 MG/0.3ML ~~LOC~~ SOLN
30.0000 mg | Freq: Two times a day (BID) | SUBCUTANEOUS | Status: DC
  Administered 2019-08-23 (×2): 30 mg via SUBCUTANEOUS
  Filled 2019-08-21 (×3): qty 0.3

## 2019-08-21 MED ORDER — SODIUM CHLORIDE 0.9 % IV SOLN
500.0000 mg | Freq: Once | INTRAVENOUS | Status: AC
  Administered 2019-08-21: 500 mg via INTRAVENOUS
  Filled 2019-08-21: qty 500

## 2019-08-21 MED ORDER — IBUPROFEN 600 MG PO TABS
600.0000 mg | ORAL_TABLET | Freq: Four times a day (QID) | ORAL | Status: DC
  Administered 2019-08-22 – 2019-08-23 (×3): 600 mg via ORAL
  Filled 2019-08-21 (×3): qty 1

## 2019-08-21 MED ORDER — MORPHINE SULFATE (PF) 0.5 MG/ML IJ SOLN
INTRAMUSCULAR | Status: DC | PRN
  Administered 2019-08-21: 3 mg via EPIDURAL

## 2019-08-21 MED ORDER — WITCH HAZEL-GLYCERIN EX PADS: 1.0000 "application " | MEDICATED_PAD | CUTANEOUS | Status: DC | PRN

## 2019-08-21 MED ORDER — CARBOPROST TROMETHAMINE 250 MCG/ML IM SOLN
INTRAMUSCULAR | Status: AC
  Filled 2019-08-21: qty 1

## 2019-08-21 MED ORDER — KETOROLAC TROMETHAMINE 30 MG/ML IJ SOLN
30.0000 mg | Freq: Four times a day (QID) | INTRAMUSCULAR | Status: AC
  Administered 2019-08-21 – 2019-08-22 (×3): 30 mg via INTRAVENOUS
  Filled 2019-08-21 (×2): qty 1

## 2019-08-21 MED ORDER — OXYCODONE-ACETAMINOPHEN 5-325 MG PO TABS
1.0000 | ORAL_TABLET | ORAL | Status: DC | PRN
  Administered 2019-08-22 – 2019-08-23 (×2): 1 via ORAL
  Filled 2019-08-21 (×2): qty 1

## 2019-08-21 MED ORDER — OXYCODONE HCL 5 MG PO TABS: 5.0000 mg | ORAL_TABLET | ORAL | Status: AC | PRN

## 2019-08-21 MED ORDER — KETOROLAC TROMETHAMINE 30 MG/ML IJ SOLN
INTRAMUSCULAR | Status: AC
  Filled 2019-08-21: qty 1

## 2019-08-21 MED ORDER — LACTATED RINGERS IV SOLN
INTRAVENOUS | Status: DC
  Administered 2019-08-21: 20:00:00 via INTRAVENOUS

## 2019-08-21 MED ORDER — GENTAMICIN SULFATE 40 MG/ML IJ SOLN
5.0000 mg/kg | INTRAVENOUS | Status: AC
  Administered 2019-08-21: 700 mg via INTRAVENOUS
  Filled 2019-08-21: qty 17.5

## 2019-08-21 MED ORDER — OXYTOCIN 40 UNITS IN NORMAL SALINE INFUSION - SIMPLE MED
2.5000 [IU]/h | INTRAVENOUS | Status: AC
  Administered 2019-08-21: 2.5 [IU]/h via INTRAVENOUS
  Filled 2019-08-21: qty 1000

## 2019-08-21 MED ORDER — KETOROLAC TROMETHAMINE 30 MG/ML IJ SOLN: 30.0000 mg | Freq: Four times a day (QID) | INTRAMUSCULAR | Status: AC

## 2019-08-21 MED ORDER — ACETAMINOPHEN 325 MG PO TABS
650.0000 mg | ORAL_TABLET | Freq: Four times a day (QID) | ORAL | Status: AC
  Administered 2019-08-21 – 2019-08-22 (×3): 650 mg via ORAL
  Filled 2019-08-21 (×3): qty 2

## 2019-08-21 MED ORDER — DIPHENHYDRAMINE HCL 25 MG PO CAPS: 25.0000 mg | ORAL_CAPSULE | ORAL | Status: DC | PRN

## 2019-08-21 MED ORDER — DIBUCAINE (PERIANAL) 1 % EX OINT: 1.0000 "application " | TOPICAL_OINTMENT | CUTANEOUS | Status: DC | PRN

## 2019-08-21 MED ORDER — CLINDAMYCIN PHOSPHATE 900 MG/50ML IV SOLN: 900.0000 mg | INTRAVENOUS | Status: DC

## 2019-08-21 MED ORDER — MORPHINE SULFATE (PF) 0.5 MG/ML IJ SOLN
INTRAMUSCULAR | Status: AC
  Filled 2019-08-21: qty 10

## 2019-08-21 SURGICAL SUPPLY — 31 items
BAG COUNTER SPONGE EZ (MISCELLANEOUS) ×4 IMPLANT
CANISTER SUCT 3000ML PPV (MISCELLANEOUS) ×2 IMPLANT
CATH KIT ON-Q SILVERSOAK 5IN (CATHETERS) ×4 IMPLANT
CHLORAPREP W/TINT 26 (MISCELLANEOUS) ×4 IMPLANT
DERMABOND ADVANCED (GAUZE/BANDAGES/DRESSINGS) ×1
DERMABOND ADVANCED .7 DNX12 (GAUZE/BANDAGES/DRESSINGS) ×1 IMPLANT
DRSG OPSITE POSTOP 4X10 (GAUZE/BANDAGES/DRESSINGS) ×2 IMPLANT
DRSG TELFA 3X8 NADH (GAUZE/BANDAGES/DRESSINGS) ×2 IMPLANT
ELECT CAUTERY BLADE 6.4 (BLADE) ×2 IMPLANT
ELECT REM PT RETURN 9FT ADLT (ELECTROSURGICAL) ×2
ELECTRODE REM PT RTRN 9FT ADLT (ELECTROSURGICAL) ×1 IMPLANT
GAUZE SPONGE 4X4 12PLY STRL (GAUZE/BANDAGES/DRESSINGS) IMPLANT
GLOVE BIO SURGEON STRL SZ7 (GLOVE) ×10 IMPLANT
GLOVE INDICATOR 7.5 STRL GRN (GLOVE) ×2 IMPLANT
GOWN STRL REUS W/ TWL LRG LVL3 (GOWN DISPOSABLE) ×3 IMPLANT
GOWN STRL REUS W/TWL LRG LVL3 (GOWN DISPOSABLE) ×3
NS IRRIG 1000ML POUR BTL (IV SOLUTION) ×2 IMPLANT
PACK C SECTION AR (MISCELLANEOUS) ×2 IMPLANT
PAD OB MATERNITY 4.3X12.25 (PERSONAL CARE ITEMS) ×2 IMPLANT
PAD PREP 24X41 OB/GYN DISP (PERSONAL CARE ITEMS) ×2 IMPLANT
PENCIL SMOKE ULTRAEVAC 22 CON (MISCELLANEOUS) ×2 IMPLANT
STRIP CLOSURE SKIN 1/2X4 (GAUZE/BANDAGES/DRESSINGS) ×2 IMPLANT
SUT MNCRL AB 4-0 PS2 18 (SUTURE) ×2 IMPLANT
SUT PDS AB 1 TP1 96 (SUTURE) ×4 IMPLANT
SUT VIC AB 0 CT1 27 (SUTURE) ×1
SUT VIC AB 0 CT1 27XCR 8 STRN (SUTURE) ×1 IMPLANT
SUT VIC AB 0 CTX 36 (SUTURE) ×2
SUT VIC AB 0 CTX36XBRD ANBCTRL (SUTURE) ×2 IMPLANT
SUT VIC AB 2-0 CT1 36 (SUTURE) ×2 IMPLANT
SUT VIC AB 3-0 SH 27 (SUTURE) ×1
SUT VIC AB 3-0 SH 27X BRD (SUTURE) ×1 IMPLANT

## 2019-08-21 NOTE — Progress Notes (Signed)
SVE 3/70/-3 AROM, scant clear fluid.  IUPC placed easily without issue Continue with pitocin for [place], [place] 08/21/2019 2:23 AM

## 2019-08-21 NOTE — Progress Notes (Signed)
Patient has been pushing for 2.5 hours. No change in station in the past 1.5 hours.  The patient is exhausted and fetal caput has developed. Given her lack of progress with good maternal effort, discussed assisting with a vacuum or forceps. However, her station is a little high for this to be accomplished safely.  After discussion of risks/benefits of proceeding with operative vaginal delivery vs c-section, mutual decision made to move forward with c-section.  Patient personally consented by me.  Currently, fetal heart rate tracing is category 1.    Prentice Docker, MD, [facility], [facility] 08/21/2019 10:26 AM

## 2019-08-21 NOTE — Transfer of Care (Signed)
Immediate Anesthesia Transfer of Care Note  Patient: Jill [name]  Procedure(s) Performed: CESAREAN SECTION (N/A )  Patient Location: PACU and Mother/Baby  Anesthesia Type:Epidural  Level of Consciousness: awake, alert  and oriented  Airway & Oxygen Therapy: Patient Spontanous Breathing  Post-op Assessment: Report given to RN and Post -op Vital signs reviewed and stable  Post vital signs: Reviewed and stable  Last Vitals:  Vitals Value Taken Time  BP    Temp    Pulse    Resp    SpO2      Last Pain:  Vitals:   08/21/19 0953  TempSrc:   PainSc: 9       Patients Stated Pain Goal: 0 (16/10/96 0454)  Complications: No apparent anesthesia complications

## 2019-08-21 NOTE — Anesthesia Post-op Follow-up Note (Signed)
Anesthesia QCDR form completed.        

## 2019-08-21 NOTE — Op Note (Signed)
Cesarean Section Operative Note    Jill [name]   08/21/2019   Pre-operative Diagnosis:  1) Second stage arrest 2) intrauterine pregnancy at [redacted]w[redacted]d   Post-operative Diagnosis:  1) Second stage arrest 2) intrauterine pregnancy at [redacted]w[redacted]d    Procedure: Primary Low Transverse Cesarean Section via Pfannenstiel incision with double-layer uterine closure  Surgeon: Surgeon(s) and Role:    Conard Novak, MD - Primary   Assistants: Ulis Rias  Anesthesia: epidural   Findings:  1) normal appearing gravid uterus, fallopian tubes, and ovaries 2) viable female infant with APGARs 2 and 8, weight 3,380 grams (7 lb 7 oz)   Estimated Blood Loss: 750 mL  Total IV Fluids: 300 ml   Urine Output: 200 mL  Specimens: none  Complications: no complications  Disposition: PACU - hemodynamically stable.   Maternal Condition: stable   Baby condition / location:  Couplet care / Skin to Skin  Procedure Details:  The patient was seen in the Holding Room. The risks, benefits, complications, treatment options, and expected outcomes were discussed with the patient. The patient concurred with the proposed plan, giving informed consent. identified as Jill [name] and the procedure verified as C-Section Delivery. A Time Out was held and the above information confirmed.   After induction of anesthesia, the patient was draped and prepped in the usual sterile manner. A Pfannenstiel incision was made and carried down through the subcutaneous tissue to the fascia. Fascial incision was made and extended transversely. The fascia was separated from the underlying rectus tissue superiorly and inferiorly. The peritoneum was identified and entered. Peritoneal incision was extended longitudinally. The bladder flap was bluntly and sharply freed from the lower uterine segment. A low transverse uterine incision was made and the hysterotomy was extended with cranial-caudal tension. Delivered from cephalic  presentation was a 3,380 gram Living newborn infant(s) or Female with Apgar scores of 2 at one minute and 8 at five minutes. Cord ph was not sent the umbilical cord was clamped and cut cord blood was not obtained for evaluation. The placenta was removed Intact and appeared normal. The uterine outline, tubes and ovaries appeared normal. There was an extension along the right aspect of the cervix which was repaired using 0-Vicryl in a running locked fashion.  Given the proximity to the vasculature, an O'Leary stitch was thrown and verification that no posterior structures were included (including bowel).  The uterine incision was closed with running locked sutures of 0 Vicryl.  A second layer of the same suture was thrown in an imbricating fashion.  Hemostasis was assured.  The uterus was returned to the abdomen and the paracolic gutters were cleared of all clots and debris.  The rectus muscles were inspected and found to be hemostatic.  The On-Q catheter pumps were inserted in accordance with the manufacturer's recommendations.  The catheters were inserted approximately 4cm cephelad to the incision line, approximately 1cm apart, straddling the midline.  They were inserted to a depth of the 4th mark. They were positioned superficial to the rectus abdominus muscles and deep to the rectus fascia.    The fascia was then reapproximated with running sutures of 1-0 PDS, looped. The subcutaneous tissue was reapproximated using 2-0 plain gut such that no greater than 2cm of dead space remained. The subcuticular closure was performed using 4-0 monocryl. The skin closure was reinforced using benzoin surgical skin glue.   The On-Q catheters were bolused with 5 mL of 0.5% marcaine plain for a total of  10 mL.  The catheters were affixed to the skin with surgical skin glue, steri-strips, and tegaderm.    Instrument, sponge, and needle counts were correct prior the abdominal closure and were correct at the conclusion of the  case.  The patient received Gentamicin, clindamycin, and azithromycin IV prior to skin incision (within 30 minutes). The vagina was prepped prior to skin incision with 2 sponges with betadine.  For VTE prophylaxis she was wearing SCDs throughout the case.    Signed: Will Bonnet, MD 08/21/2019 1:08 PM

## 2019-08-22 LAB — CBC
HCT: 30.7 % — ABNORMAL LOW (ref 36.0–46.0)
Hemoglobin: 10.1 g/dL — ABNORMAL LOW (ref 12.0–15.0)
MCH: 28.4 pg (ref 26.0–34.0)
MCHC: 32.9 g/dL (ref 30.0–36.0)
MCV: 86.2 fL (ref 80.0–100.0)
Platelets: 319 10*3/uL (ref 150–400)
RBC: 3.56 MIL/uL — ABNORMAL LOW (ref 3.87–5.11)
RDW: 14.7 % (ref 11.5–15.5)
WBC: 17 10*3/uL — ABNORMAL HIGH (ref 4.0–10.5)
nRBC: 0 % (ref 0.0–0.2)

## 2019-08-22 NOTE — Anesthesia Postprocedure Evaluation (Signed)
Anesthesia Post Note  Patient: Jill [name]  Procedure(s) Performed: CESAREAN SECTION (N/A )  Patient location during evaluation: Mother Baby Anesthesia Type: Epidural Level of consciousness: awake and alert Pain management: pain level controlled Respiratory status: spontaneous breathing, nonlabored ventilation and respiratory function stable Cardiovascular status: stable Postop Assessment: no headache, no backache and epidural receding Anesthetic complications: no     Last Vitals:  Vitals:   08/22/19 1051 08/22/19 1055  BP:    Pulse:    Resp:    Temp:    SpO2: 95% 95%    Last Pain:  Vitals:   08/22/19 0606  TempSrc: Oral  PainSc:                  Durenda Hurt

## 2019-08-22 NOTE — Progress Notes (Signed)
Obstetric Postpartum/PostOperative Daily Progress Note Subjective:  33 y.o. [id number] post-operative day # 1 status post primary cesarean delivery.  She is not ambulating due to magnesium, is tolerating po, is not voiding spontaneously due to her foley catheter still in place for magnesium.  Her pain is well controlled on PO pain medications. Her lochia is less than menses. Denies trouble breathing.  Denies other issues.   Medications SCHEDULED MEDICATIONS  . acetaminophen  650 mg Oral Q6H  . enoxaparin (LOVENOX) injection  30 mg Subcutaneous Q12H  . ferrous sulfate  325 mg Oral BID WC  . ibuprofen  600 mg Oral Q6H  . ketorolac  30 mg Intravenous Q6H   Or  . ketorolac  30 mg Intramuscular Q6H  . measles, mumps & rubella vaccine  0.5 mL Subcutaneous Once  . prenatal multivitamin  1 tablet Oral Q1200  . senna-docusate  2 tablet Oral Q24H  . simethicone  80 mg Oral TID PC    MEDICATION INFUSIONS  . lactated ringers 12.5 mL/hr at 08/21/19 1930  . magnesium sulfate 2 g/hr (08/21/19 1335)  . naLOXone [facility]) adult infusion for PRURITIS    . oxytocin Stopped (08/22/19 0635)    PRN MEDICATIONS  coconut oil, witch hazel-glycerin **AND** dibucaine, diphenhydrAMINE **OR** diphenhydrAMINE, diphenhydrAMINE, labetalol **AND** labetalol **AND** labetalol **AND** Measure blood pressure, menthol-cetylpyridinium, nalbuphine **OR** nalbuphine, nalbuphine **OR** nalbuphine, naloxone **AND** sodium chloride flush, naLOXone (NARCAN) adult infusion for PRURITIS, ondansetron (ZOFRAN) IV, oxyCODONE, oxyCODONE-acetaminophen **OR** oxyCODONE-acetaminophen    Objective:   Vitals:   08/22/19 0721 08/22/19 0726 08/22/19 0731 08/22/19 0736  BP:      Pulse:      Resp:      Temp:      TempSrc:      SpO2: 97% 94% 94% 94%  Weight:      Height:        Current Vital Signs 24h Vital Sign Ranges  T 98.6 F (37 C) Temp  Avg: 98.4 F (36.9 C)  Min: 98.1 F (36.7 C)  Max: 98.6 F (37 C)  BP 123/60 BP  Min:  101/62  Max: 125/72  HR 83 Pulse  Avg: 89  Min: 82  Max: 99  RR 16 Resp  Avg: 20  Min: 14  Max: 24  SaO2 94 % Room Air SpO2  Avg: 95 %  Min: 91 %  Max: 99 %       24 Hour I/O Current Shift I/O  Time Ins Outs 11/14 0701 - 11/15 0700 In: 3122.1 [P.O.:1080; I.V.:1792.1] Out: 4760 [Urine:4010] 11/15 0701 - 11/15 1900 In: 93.8 [I.V.:93.8] Out: 500 [Urine:500]  General: NAD Pulmonary: no increased work of breathing Abdomen: non-distended, non-tender, fundus firm at level of umbilicus Inc: Clean/dry/intact - dressing in place Extremities: no edema, no erythema, no tenderness  Labs:  Recent Labs  Lab 08/19/19 1922 08/21/19 2116 08/22/19 0741  WBC 12.5* 21.2* 17.0*  HGB 11.3* 10.6* 10.1*  HCT 33.2* 31.1* 30.7*  PLT 344 323 319     Assessment:   33 y.o. [id number] postoperative day # 1 status post primary cesarean section, preeclampsia with severe features  Plan:  1) Acute blood loss anemia - hemodynamically stable and asymptomatic - po ferrous sulfate  2) AB POS / Rubella <0.90 (03/30 1144)/ Varicella Immune  3) TDAP status up to date (06/30/2019), flu vaccine: received 06/16/2019  4) breast feeding /Contraception = oral progesterone-only contraceptive  5) Preeclampsia with severe features: stop mag 24 hours pp  6) Disposition: home POD#2-3  Conard Novak, MD, FACOG 08/22/2019 9:46 AM

## 2019-08-23 ENCOUNTER — Encounter: Payer: Self-pay | Admitting: Obstetrics and Gynecology

## 2019-08-23 MED ORDER — OXYCODONE HCL 5 MG PO TABS: 5.0000 mg | ORAL_TABLET | Freq: Four times a day (QID) | ORAL | 0 refills | Status: AC | PRN

## 2019-08-23 MED ORDER — IBUPROFEN 600 MG PO TABS
600.0000 mg | ORAL_TABLET | Freq: Four times a day (QID) | ORAL | Status: DC
  Administered 2019-08-23 (×2): 600 mg via ORAL
  Filled 2019-08-23 (×2): qty 1

## 2019-08-23 MED ORDER — MEASLES, MUMPS & RUBELLA VAC IJ SOLR
0.5000 mL | Freq: Once | INTRAMUSCULAR | Status: AC
  Administered 2019-08-23: 0.5 mL via SUBCUTANEOUS
  Filled 2019-08-23: qty 0.5

## 2019-08-23 MED ORDER — ENOXAPARIN SODIUM 30 MG/0.3ML ~~LOC~~ SOLN: 30.0000 mg | Freq: Two times a day (BID) | SUBCUTANEOUS | 0 refills | Status: DC

## 2019-08-23 NOTE — Lactation Note (Addendum)
This note was copied from a baby's chart. Lactation Consultation Note  Patient Name: Jill [name] [id number] Date: 08/23/2019 Reason for consult: Initial assessment  LC and [place] intern in to see mom and baby Maurene Capes.  Mom reported to the RN overnight that she no longer wanted to breastfeed for multiple reasons- 1) weight loss in infant, 2) painful breastfeeding, however LC noted a BF session in chart this morning. Mom states that MD recommended formula supplement due to infants weight loss of 7% quickly after birth, and mom feels that baby is not getting enough and has caused nipple damage: mom reported cracked nipple.  [place] asked mom what her plan was going forward and after discharge, mom said formula but seemed unsure. Reviewed with mom feeding plans that are options and encouraging her to pick one that works best for her. Mom requested information for discontinuation of milk production: reviewed ice packs, tylenol, cabbage leaves, expression to comfort. LC also covered information for assisting with infants position and latch at the breast, and nipple care/wound healing and working to improve breastfeeding.  LC discussed use of coconut oil and expressed colostrum to aid in wound healing/nipple care. Coconut oil given, along with q-tip for application by [facility] intern. Mom agrees to call out to [facility] for assistance with baby's next feeding before she fully decides to quit breastfeeding, and before going home. [facility] name and number written on whiteboard.   Maternal Data Formula Feeding for Exclusion: No Has patient been taught Hand Expression?: Yes Does the patient have breastfeeding experience prior to this delivery?: No  Feeding Feeding Type: Breast Fed  LATCH Score                   Interventions Interventions: Breast feeding basics reviewed  Lactation Tools Discussed/Used     Consult Status Consult Status: Follow-up Date: 08/23/19 Follow-up type: In-patient    Lavonia Drafts 08/23/2019, 10:56 AM

## 2019-08-23 NOTE — Discharge Summary (Signed)
OB Discharge Summary     Patient Name: Jill [name] DOB: 1985/12/04 MRN: [id number]  Date of admission: 08/19/2019 Delivering MD: Prentice Docker  Date of Delivery: 08/21/2019  Date of discharge: 08/23/2019  Admitting diagnosis: induction of labor Intrauterine pregnancy: [redacted]w[redacted]d     Secondary diagnosis: Preeclampsia     Discharge diagnosis: Term Pregnancy Delivered                                                                                                Post partum procedures: magnesium sulfate IV for 24 hours postpartum  Augmentation: AROM, Pitocin and Cytotec  Complications: None  Hospital course:  Induction of Labor With Cesarean Section  33 y.o. yo [phone] at [redacted]w[redacted]d was admitted to the hospital 08/19/2019 for induction of labor. Patient had a labor course significant for severe preeclampsia based on neurological features. The patient went for cesarean section due to Arrest of Descent, and delivered a Viable infant,08/21/2019  Membrane Rupture Time/Date: 2:17 AM ,08/21/2019   Details of operation can be found in separate operative Note.  Patient had an uncomplicated postpartum course. She is ambulating, tolerating a regular diet, passing flatus, and urinating well.  Patient is discharged home in stable condition on 08/23/19.                                    Physical exam  Vitals:   08/22/19 2349 08/23/19 0406 08/23/19 0804 08/23/19 1200  BP: 125/70 120/87 125/78   Pulse: 84 93 79   Resp: 18 18 18    Temp: 98.2 F (36.8 C) 98 F (36.7 C) 98.3 F (36.8 C) 98.5 F (36.9 C)  TempSrc: Oral Oral Oral Oral  SpO2: 96% 98% 98%   Weight:      Height:       General: alert, cooperative and no distress Lochia: appropriate Uterine Fundus: firm Incision: Healing well with no significant drainage, Dressing is clean, dry, and intact, On Q pump dressing with sero/sang drainage to be replaced DVT Evaluation: No evidence of DVT seen on physical exam.  Labs: Lab Results   Component Value Date   WBC 17.0 (H) 08/22/2019   HGB 10.1 (L) 08/22/2019   HCT 30.7 (L) 08/22/2019   MCV 86.2 08/22/2019   PLT 319 08/22/2019    Discharge instruction: per After Visit Summary.  Medications:  Allergies as of 08/23/2019      Reactions   Amoxicillin Anaphylaxis, Shortness Of Breath, Rash   Childhood reaction  Has patient had a PCN reaction causing immediate rash, facial/tongue/throat swelling, SOB or lightheadedness with hypotension: Yes Has patient had a PCN reaction causing severe rash involving mucus membranes or skin necrosis: Unknown Has patient had a PCN reaction that required hospitalization: No Has patient had a PCN reaction occurring within the last 10 years: No If all of the above answers are "NO", then may proceed with Cephalosporin use.   Penicillins    Pt does not think that she is allergic to this medication but is not sure  Medication List    STOP taking these medications   omeprazole 20 MG capsule Commonly known as: PRILOSEC   promethazine 25 MG tablet Commonly known as: PHENERGAN   sucralfate 1 g tablet Commonly known as: Carafate     TAKE these medications   enoxaparin 30 MG/0.3ML injection Commonly known as: LOVENOX Inject 0.3 mLs (30 mg total) into the skin every 12 (twelve) hours.   multivitamin-prenatal 27-0.8 MG Tabs tablet Take 1 tablet by mouth daily at 12 noon.   oxyCODONE 5 MG immediate release tablet Commonly known as: Roxicodone Take 1 tablet (5 mg total) by mouth every 6 (six) hours as needed for up to 5 days for severe pain.   sertraline 50 MG tablet Commonly known as: ZOLOFT Take 50 mg by mouth daily.            Discharge Care Instructions  (From admission, onward)         Start     Ordered   08/23/19 0000  Discharge wound care:    Comments: Keep incision dry, clean.   08/23/19 1214          Diet: routine diet  Activity: Advance as tolerated. Pelvic rest for 6 weeks.   Outpatient follow  up: Follow-up Information    Conard Novak, MD. Go in 1 week(s).   Specialty: Obstetrics and Gynecology Why: postpartum wound check Contact information: [address] [place] [address] [phone]             Postpartum contraception: Progesterone only pills Rhogam Given postpartum: no Rubella vaccine given postpartum: yes Varicella vaccine given postpartum: Immune TDaP given antepartum or postpartum: given antepartum  Newborn Data: Live born female [place] Birth Weight: 7 lb 7.2 oz (3380 g) APGAR: 2, 8  Newborn Delivery   Birth date/time: 08/21/2019 12:04:00 Delivery type: C-Section, Low Transverse C-section categorization: Primary       Baby Feeding: Formula  Disposition:home with mother  SIGNED:  Tresea Mall, CNM 08/23/2019 12:17 PM

## 2019-08-23 NOTE — Progress Notes (Signed)
DC to home with NB.  Reviewed dc instr and f/u appt.  Pt verb meds for home use.  DC to home.  To car via wc with staff.

## 2019-08-27 ENCOUNTER — Ambulatory Visit (INDEPENDENT_AMBULATORY_CARE_PROVIDER_SITE_OTHER): Payer: Managed Care, Other (non HMO) | Admitting: Obstetrics and Gynecology

## 2019-08-27 ENCOUNTER — Other Ambulatory Visit: Payer: Self-pay

## 2019-08-27 ENCOUNTER — Encounter: Payer: Self-pay | Admitting: Obstetrics and Gynecology

## 2019-08-27 VITALS — BP 120/80 | Ht 69.0 in | Wt 287.0 lb

## 2019-08-27 DIAGNOSIS — Z09 Encounter for follow-up examination after completed treatment for conditions other than malignant neoplasm: Secondary | ICD-10-CM

## 2019-08-27 DIAGNOSIS — Z98891 History of uterine scar from previous surgery: Secondary | ICD-10-CM

## 2019-08-27 DIAGNOSIS — O1413 Severe pre-eclampsia, third trimester: Secondary | ICD-10-CM

## 2019-08-27 NOTE — Progress Notes (Signed)
   Postoperative Follow-up Patient presents post op from cesarean section  34 days ago.  Subjective: She denies fever, chills, nausea and vomiting. Eating a regular diet without difficulty. The patient is not having any pain.  Activity: increasing slowly. She denies issues with her incision.  She has had some episodes of headaches and nausea with emesis. Overall, she is doing well. Her headache is not relieved by Tylenol.   Objective: BP 120/80   Ht 5\' 9"  (1.753 m)   Wt 287 lb (130.2 kg)   LMP 11/30/2018 (Exact Date)   Breastfeeding Yes   BMI 42.38 kg/m   Constitutional: Well nourished, well developed female in no acute distress.  HEENT: normal Skin: Warm and dry.  Abdomen: s/nt/nd uterine fundus at U-2 clean, dry, intact and without erythema, induration, warmth, and tenderness Extremity: edema R>L, no erythema, warmth, tenderness.     Assessment: 33 y.o. s/p cesarean section progressing well  Plan: Patient has done well after surgery with no apparent complications.  I have discussed the post-operative course to date, and the expected progress moving forward.  The patient understands what complications to be concerned about.    Activity plan: no heavy lifting (> 25 pounds) until 6 weeks out from surgery.   Monitor right ankle/leg. If gets worse, red, painful, please contact me ASAP.   Plan for progesterone-only pills for contraception.   Return in about 5 weeks (around 10/01/2019) for Six Week Postpartum.  Prentice Docker, MD 08/27/2019 1:51 PM

## 2019-08-30 ENCOUNTER — Telehealth: Payer: Self-pay

## 2019-08-30 NOTE — Telephone Encounter (Signed)
Pt called back with update on right side pain. She states she has only had 2 bowel movements since her C/S on 11/14. She states she thinks it is gas build up.She is still taking pain medication/prenatal vitamin/iron pill. Advised stool softner every day until BM occurs. Then QOD, increase water intake/green leafy vegetables/ walking.  Pt voiced an understanding and was thankful for the call. She will call back in a few days if symptoms are still occurring.

## 2019-08-30 NOTE — Telephone Encounter (Signed)
Office received notification from after triage nurse pt had called on 11/22 stating she was having post-op pain in right lower abdominal area. She had a C/S on 11/14.   LVM for pt to call back and give an update on her status.

## 2019-08-31 ENCOUNTER — Telehealth: Payer: Self-pay

## 2019-08-31 NOTE — Telephone Encounter (Signed)
FMLA/DISABILITY form for [employer] filled out, signature obtained and given to KT for processing. 

## 2019-09-01 NOTE — Telephone Encounter (Signed)
Not sure what you wanted her to do I only see BMI as risk factor.  I do it while they are inpatient but once they go home I generally don't continue

## 2019-09-27 ENCOUNTER — Encounter: Payer: Self-pay | Admitting: Obstetrics and Gynecology

## 2019-09-27 ENCOUNTER — Other Ambulatory Visit: Payer: Self-pay

## 2019-09-27 ENCOUNTER — Ambulatory Visit (INDEPENDENT_AMBULATORY_CARE_PROVIDER_SITE_OTHER): Payer: Managed Care, Other (non HMO) | Admitting: Obstetrics and Gynecology

## 2019-09-27 DIAGNOSIS — Z98891 History of uterine scar from previous surgery: Secondary | ICD-10-CM

## 2019-09-27 DIAGNOSIS — Z30011 Encounter for initial prescription of contraceptive pills: Secondary | ICD-10-CM

## 2019-09-27 MED ORDER — NORETHINDRONE 0.35 MG PO TABS: 1.0000 | ORAL_TABLET | Freq: Every day | ORAL | 4 refills | Status: DC

## 2019-09-27 NOTE — Progress Notes (Signed)
Postpartum Visit  Chief Complaint:  Chief Complaint  Patient presents with  . Postpartum Care    History of Present Illness: Patient is a 33 y.o. [id number] presents for postpartum visit.  Date of delivery: 08/21/2019 Type of delivery: C-Section Episiotomy No.  Laceration: no Pregnancy or labor problems:  Preeclampsia  Any problems since the delivery:  Depression, on Zoloft   Newborn Details:  SINGLETON :  1. Baby's name: Maurene Capes. Birth weight: 7.7lb Maternal Details:  Breast Feeding:  formula Post partum depression/anxiety noted:  Yes, on zoloft  Edinburgh Post-Partum Depression Score:  5  Date of last PAP: 01/31/2017- NORMAL   Past Medical History:  Diagnosis Date  . Bipolar 1 disorder ([place])   . Depression   . Migraine   . OCD (obsessive compulsive disorder)   . TMJ (dislocation of temporomandibular joint)     Past Surgical History:  Procedure Laterality Date  . CESAREAN SECTION N/A 08/21/2019   Procedure: CESAREAN SECTION;  Surgeon: Will Bonnet, MD;  Location: [facility] ORS;  Service: Obstetrics;  Laterality: N/A;  . DILATION AND EVACUATION N/A 03/03/2018   Procedure: DILATATION AND EVACUATION;  Surgeon: Gae Dry, MD;  Location: [facility] OR[facility];  Service: Gynecology;  Laterality: N/A;  . LAPAROSCOPIC LYSIS OF ADHESIONS  03/21/2017   Procedure: LAPAROSCOPIC LYSIS OF ADHESIONS;  Surgeon: Will Bonnet, MD;  Location: [facility];  Service: Gynecology;;  . LAPAROSCOPY N/A 03/21/2017   Procedure: LAPAROSCOPY DIAGNOSTIC/FULGERATION OF ENDOMETRIAL IMPLANTS;  Surgeon: Will Bonnet, MD;  Location: [facility];  Service: Gynecology;  Laterality: N/A;  . None      Prior to Admission medications   Medication Sig Start Date End Date Taking? Authorizing Provider  Prenatal Vit-Fe Fumarate-FA (MULTIVITAMIN-PRENATAL) 27-0.8 MG TABS tablet Take 1 tablet by mouth daily at 12 noon.   Yes [provider]  sertraline (ZOLOFT) 50 MG tablet Take 50 mg by mouth daily.   Yes  [provider]  enoxaparin (LOVENOX) 30 MG/0.3ML injection Inject 0.3 mLs (30 mg total) into the skin every 12 (twelve) hours. 08/23/19 09/22/19  Rod Can, CNM    Allergies  Allergen Reactions  . Amoxicillin Anaphylaxis, Shortness Of Breath and Rash    Childhood reaction  Has patient had a PCN reaction causing immediate rash, facial/tongue/throat swelling, SOB or lightheadedness with hypotension: Yes Has patient had a PCN reaction causing severe rash involving mucus membranes or skin necrosis: Unknown Has patient had a PCN reaction that required hospitalization: No Has patient had a PCN reaction occurring within the last 10 years: No If all of the above answers are "NO", then may proceed with Cephalosporin use.  Marland Kitchen Penicillins     Pt does not think that she is allergic to this medication but is not sure      Social History   Socioeconomic History  . Marital status: Married    Spouse name: Thurmond Butts  . Number of children: Not on file  . Years of education: Not on file  . Highest education level: Not on file  Occupational History  . Occupation: Lab assisstant  Tobacco Use  . Smoking status: Never Smoker  . Smokeless tobacco: Never Used  Substance and Sexual Activity  . Alcohol use: No    Alcohol/week: 0.0 standard drinks  . Drug use: No  . Sexual activity: Not Currently    Partners: Male    Birth control/protection: None  Other Topics Concern  . Not on file  Social History Narrative   She is originally from  [place] and graduated high school in [place]. She has been married for approximately 2years and has no children. She has been working for [employer] for the past one month. She currently lives with her husband. She denies any physical or sexual abuse.   Social Determinants of Health   Financial Resource Strain:   . Difficulty of Paying Living Expenses: Not on file  Food Insecurity:   . Worried About Programme researcher, broadcasting/film/videounning Out of Food in the Last Year: Not on file  . Ran  Out of Food in the Last Year: Not on file  Transportation Needs:   . Lack of Transportation (Medical): Not on file  . Lack of Transportation (Non-Medical): Not on file  Physical Activity:   . Days of Exercise per Week: Not on file  . Minutes of Exercise per Session: Not on file  Stress:   . Feeling of Stress : Not on file  Social Connections:   . Frequency of Communication with Friends and Family: Not on file  . Frequency of Social Gatherings with Friends and Family: Not on file  . Attends Religious Services: Not on file  . Active Member of Clubs or Organizations: Not on file  . Attends BankerClub or Organization Meetings: Not on file  . Marital Status: Not on file  Intimate Partner Violence:   . Fear of Current or Ex-Partner: Not on file  . Emotionally Abused: Not on file  . Physically Abused: Not on file  . Sexually Abused: Not on file    Family History  Problem Relation Age of Onset  . Depression Mother   . Alcohol abuse Father   . Lung cancer Maternal Grandmother   . Hypertension Maternal Grandmother   . Hypertension Maternal Grandfather   . Stroke Maternal Grandfather   . Hypertension Paternal Grandmother   . Hypertension Paternal Grandfather   . Breast cancer Neg Hx     Review of Systems  Constitutional: Negative.   HENT: Negative.   Eyes: Negative.   Respiratory: Negative.   Cardiovascular: Negative.   Gastrointestinal: Negative.   Genitourinary: Negative.   Musculoskeletal: Negative.   Skin: Negative.   Neurological: Negative.   Psychiatric/Behavioral: Negative.      Physical Exam BP 128/84   Ht 5\' 9"  (1.753 m)   Wt 280 lb (127 kg)   LMP 11/30/2018 (Exact Date)   BMI 41.35 kg/m   Physical Exam Constitutional:      General: She is not in acute distress.    Appearance: Normal appearance. She is well-developed.  HENT:     Head: Normocephalic and atraumatic.  Eyes:     General: No scleral icterus.    Conjunctiva/sclera: Conjunctivae normal.    Cardiovascular:     Rate and Rhythm: Normal rate and regular rhythm.     Heart sounds: No murmur. No friction rub. No gallop.   Pulmonary:     Effort: Pulmonary effort is normal. No respiratory distress.     Breath sounds: Normal breath sounds. No wheezing or rales.  Abdominal:     General: Bowel sounds are normal. There is no distension.     Palpations: Abdomen is soft. There is no mass.     Tenderness: There is no abdominal tenderness. There is no guarding or rebound.     Comments: without erythema, induration, warmth, and tenderness. It is clean, dry, and intact.    Musculoskeletal:        General: Normal range of motion.     Cervical back: Normal range of motion  and neck supple.  Neurological:     General: No focal deficit present.     Mental Status: She is alert and oriented to person, place, and time.     Cranial Nerves: No cranial nerve deficit.  Skin:    General: Skin is warm and dry.     Findings: No erythema.  Psychiatric:        Mood and Affect: Mood normal.        Behavior: Behavior normal.        Judgment: Judgment normal.      Female Chaperone present during breast and/or pelvic exam.  Assessment: 33 y.o. [id number] presenting for 6 week postpartum visit  Plan: Problem List Items Addressed This Visit      Other   Status post cesarean delivery    Other Visit Diagnoses    Postpartum care and examination    -  Primary   Relevant Medications   norethindrone (MICRONOR) 0.35 MG tablet   Encounter for initial prescription of contraceptive pills       Relevant Medications   norethindrone (MICRONOR) 0.35 MG tablet       1) Contraception Education given regarding options for contraception, including oral contraceptives. Progesterone only with history of migraine headaches.  2)  Pap - ASCCP guidelines and rational discussed.  Patient opts for routine screening interval  3) Patient underwent screening for postpartum depression with no concerns noted. She sees  her regular care provider for her psychiatric medications soon and this will be managed by that provider.   4) Follow up 1 year for routine annual exam  Thomasene Mohair, MD 09/27/2019 11:40 AM

## 2019-10-22 ENCOUNTER — Other Ambulatory Visit: Payer: Self-pay

## 2019-10-22 ENCOUNTER — Ambulatory Visit (INDEPENDENT_AMBULATORY_CARE_PROVIDER_SITE_OTHER): Payer: 59 | Admitting: Addiction (Substance Use Disorder)

## 2019-10-22 DIAGNOSIS — F319 Bipolar disorder, unspecified: Secondary | ICD-10-CM
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
## 2019-10-29 ENCOUNTER — Ambulatory Visit (INDEPENDENT_AMBULATORY_CARE_PROVIDER_SITE_OTHER): Payer: 59 | Admitting: Addiction (Substance Use Disorder)

## 2019-10-29 DIAGNOSIS — F319 Bipolar disorder, unspecified: Secondary | ICD-10-CM

## 2019-10-29 NOTE — Progress Notes (Signed)
[facility] Counselor/Therapist Progress Note  Patient ID: Jill [name], MRN: [id number],    Date: 10/29/2019  Time Spent: 10:05- 11:00 55 mins  Treatment Type: Individual Therapy  Reported Symptoms: feeling cooped up, unmotivated, eating too much.  Mental Status Exam:  Appearance:   Guarded     Behavior:  Appropriate  Motor:  Normal  Speech/Language:   Slow  Affect:  Depressed  Mood:  depressed, dysthymic and sad  Thought process:  flight of ideas  Thought content:    Obsessions and Rumination  Sensory/Perceptual disturbances:    WNL  Orientation:  x4  Attention:  Good  Concentration:  Good  Memory:  WNL  Fund of knowledge:   Good  Insight:    Good  Judgment:   Good  Impulse Control:  Fair   Risk Assessment: Danger to Self:  No Self-injurious Behavior: No Danger to Others: No Duty to Warn:no Physical Aggression / Violence:No  Access to Firearms a concern: No  Gang Involvement:No   Subjective:  Virtual Visit via Telephone Note: I Connected with client by a video enabled telemedicine/telehealth application or telephone, with their informed consent, and verified client privacy and that I am speaking with the correct person using two identifiers. I discussed the limitations, risks, security and privacy concerns of performing psychotherapy and management service by telephone/teletherapy and the availability of in person appointments and confirmed their location. I also discussed with the patient that there may be a patient responsible charge related to this service and to confirm with the front desk if their insurance accepts teletherapy. The patient expressed understanding and agreed to proceed. I discussed the treatment planning with the client. The client was provided an opportunity to ask questions and all were answered. The client agreed with the plan and demonstrated an understanding of the instructions. The client was advised to call our office if symptoms  worsen or feel they are in a crisis state and need immediate contact. Client also reminded of a crisis line number and to use 9-1-1 if there's an emergency.  Therapist Location: office; Client Location: home.  Client reported feeling cooped up, unmotivated, eating too much. Therapist used psychoeducation to educate the client about OCD and helped client identify the Obsessive circular thought shes having regarding things such as planning a perfect trip, having to hold her baby and not be able to eat/shower etc or she's a bad mom, and fear of having her baby get sick and die. Therapist discussed client's past traumas and griefs related to some of her needs to be in control and validated why client is experiencing issues regarding these. Therapist also encouraged client to go back on her medicines that help with her instable mood and obsessive ruminations. Client made progress discussing her family dynamic and the support that her husband is in disrupting her obsessive thought and encouraging behaviors to take care of herself and keep her from procrastinating. Therapist used MI to further inquire about client's behaviors, thoughts, and affirm her struggle while finding out more of her safety behaviors. Client made progress by identifying her safety behaviors that keep her procrastinating and discussing goals based on things in the past that helped her change.  Interventions: Cognitive Behavioral Therapy, Motivational Interviewing, Grief Therapy and Family Systems  Diagnosis:   ICD-10-CM   1. Bipolar 1 disorder ([facility])  F31.9    Plan of Care: Client to return for weekly therapy with Zoila Shutter, therapist, to review again in 6 months. Client is  to begin seeing a medication provider for support of mood management & mood swings. Client to engage in CBT: challenging negative internal ruminationsand self-talk AEB journaling daily or expressing thoughts to support persons in their life and then challenging it  with truth.  Client to engage in tapping to tap in healthy cognition that challenges negative rumination or deep negative core belief.  Client to practice DBT distress tolerance skills to decrease crying spells and thoughts of not being able to endure their suffering AEB using mindfulness, deep breathing, and TIP to increase tolerance for discomfort, discharge emotional distress, and increase their understanding that they can do hard things.  Client is also to engage in [place] therapy with therapist as needed for emotion regulation improvement and to reduce thoughts of "not being a good mother" or fear about "something bad happening to her infant, Maurene Capes" & SUDs AEB engaging in the use of BSP in session and then using tools learned in BSP on a daily basis at home.  Client participated in the treatment planning of their therapy. Client agreed with the plan and understands what to do if there is a crisis: call 9-1-1 and/or crisis line given by therapist.   Barnie Del, LCSW, LCAS, CCTP, CCS-I, BSP

## 2019-11-02 ENCOUNTER — Encounter: Payer: Self-pay | Admitting: Adult Health

## 2019-11-02 ENCOUNTER — Other Ambulatory Visit: Payer: Self-pay

## 2019-11-02 ENCOUNTER — Ambulatory Visit (INDEPENDENT_AMBULATORY_CARE_PROVIDER_SITE_OTHER): Payer: 59 | Admitting: Adult Health

## 2019-11-02 DIAGNOSIS — F331 Major depressive disorder, recurrent, moderate: Secondary | ICD-10-CM | POA: Diagnosis not present

## 2019-11-02 DIAGNOSIS — F422 Mixed obsessional thoughts and acts: Secondary | ICD-10-CM

## 2019-11-02 DIAGNOSIS — G4700 Insomnia, unspecified: Secondary | ICD-10-CM

## 2019-11-02 DIAGNOSIS — F319 Bipolar disorder, unspecified: Secondary | ICD-10-CM | POA: Diagnosis not present

## 2019-11-02 MED ORDER — LAMOTRIGINE 25 MG PO TABS: ORAL_TABLET | ORAL | 2 refills | Status: DC

## 2019-11-02 MED ORDER — FLUVOXAMINE MALEATE 50 MG PO TABS: 50.0000 mg | ORAL_TABLET | Freq: Two times a day (BID) | ORAL | 2 refills | Status: DC

## 2019-11-02 NOTE — Progress Notes (Signed)
[facility] MD/PA/NP Initial Note  11/02/2019 1:22 PM Jill [name]  MRN:  [id number]  Chief Complaint:  Chief Complaint    Anxiety; Depression; Other      HPI:   Describes mood today as "so-so". Pleasant. Having "crying spells". Mood symptoms - reports depression, anxiety, and irritability. Denies postpartum symptoms. Stating "I got frustrated and thought I was a bad mother because I couldn't breastfeed, but that passed". Also stating "I was stable on medications, but had to stop because of pregnanc"y. Feels like she needs to restart. Having manic episodes - obsessive behaviors. Can only thinking about one thing. Stating "here lately planning a vacation to [place]". Has been spending "quite a bit of money online shopping". Stating "I'm spending money I don't have". Has worked at [employer] for the past 5 years - returns to work on February 1st. Mother-in-law will be taking care of son. Stable interest and motivation. Taking medications as prescribed.  Energy levels stable. Active, does not have a regular exercise routine. Enjoys some usual interests and activities. Married. Lives with husband of 5 years and newborn son. Lives in [place]. Family local. Sister lives in [place]. Appetite adequate. Weight loss.  Sleeps well most nights. Averages 5 to 6 hours. Denies daytime napping.  Focus and concentration stable. Completing tasks. Managing aspects of household.  Denies SI or HI. Denies AH or VH.  Previous medication trials: Lamictal, Zoloft, Luvox  Visit Diagnosis:    ICD-10-CM   1. Mixed obsessional thoughts and acts  F42.2   2. Bipolar I disorder (HCC)  F31.9   3. Major depressive disorder, recurrent episode, moderate (HCC)  F33.1   4. Insomnia, unspecified type  G47.00     Past Psychiatric History: 2015 - cut wrist - depressed.  Past Medical History:  Past Medical History:  Diagnosis Date  . Bipolar 1 disorder (HCC)   . Depression   . Migraine   . OCD (obsessive  compulsive disorder)   . TMJ (dislocation of temporomandibular joint)     Past Surgical History:  Procedure Laterality Date  . CESAREAN SECTION N/A 08/21/2019   Procedure: CESAREAN SECTION;  Surgeon: Conard Novak, MD;  Location: [facility] ORS;  Service: Obstetrics;  Laterality: N/A;  . DILATION AND EVACUATION N/A 03/03/2018   Procedure: DILATATION AND EVACUATION;  Surgeon: Nadara Mustard, MD;  Location: [facility] OR[facility];  Service: Gynecology;  Laterality: N/A;  . LAPAROSCOPIC LYSIS OF ADHESIONS  03/21/2017   Procedure: LAPAROSCOPIC LYSIS OF ADHESIONS;  Surgeon: Conard Novak, MD;  Location: [facility];  Service: Gynecology;;  . LAPAROSCOPY N/A 03/21/2017   Procedure: LAPAROSCOPY DIAGNOSTIC/FULGERATION OF ENDOMETRIAL IMPLANTS;  Surgeon: Conard Novak, MD;  Location: [facility];  Service: Gynecology;  Laterality: N/A;  . None      Family Psychiatric History: Sister has borderline personality disorder - hospitalized x 5. Mother has depression - Bipolar. GGM - Schizophrenia.   Family History:  Family History  Problem Relation Age of Onset  . Depression Mother   . Alcohol abuse Father   . Lung cancer Maternal Grandmother   . Hypertension Maternal Grandmother   . Hypertension Maternal Grandfather   . Stroke Maternal Grandfather   . Hypertension Paternal Grandmother   . Hypertension Paternal Grandfather   . Breast cancer Neg Hx     Social History:  Social History   Socioeconomic History  . Marital status: Married    Spouse name: [name]  . Number of children: Not on file  . Years of education:  Not on file  . Highest education level: Not on file  Occupational History  . Occupation: Lab assisstant  Tobacco Use  . Smoking status: Never Smoker  . Smokeless tobacco: Never Used  Substance and Sexual Activity  . Alcohol use: No    Alcohol/week: 0.0 standard drinks  . Drug use: No  . Sexual activity: Not Currently    Partners: Male    Birth control/protection: None  Other Topics  Concern  . Not on file  Social History Narrative   She is originally from [place] and graduated high school in [place]. She has been married for approximately 2years and has no children. She has been working for [employer] for the past one month. She currently lives with her husband. She denies any physical or sexual abuse.   Social Determinants of Health   Financial Resource Strain:   . Difficulty of Paying Living Expenses: Not on file  Food Insecurity:   . Worried About Programme researcher, broadcasting/film/video in the Last Year: Not on file  . Ran Out of Food in the Last Year: Not on file  Transportation Needs:   . Lack of Transportation (Medical): Not on file  . Lack of Transportation (Non-Medical): Not on file  Physical Activity:   . Days of Exercise per Week: Not on file  . Minutes of Exercise per Session: Not on file  Stress:   . Feeling of Stress : Not on file  Social Connections:   . Frequency of Communication with Friends and Family: Not on file  . Frequency of Social Gatherings with Friends and Family: Not on file  . Attends Religious Services: Not on file  . Active Member of Clubs or Organizations: Not on file  . Attends Banker Meetings: Not on file  . Marital Status: Not on file    Allergies:  Allergies  Allergen Reactions  . Amoxicillin Anaphylaxis, Shortness Of Breath and Rash    Childhood reaction  Has patient had a PCN reaction causing immediate rash, facial/tongue/throat swelling, SOB or lightheadedness with hypotension: Yes Has patient had a PCN reaction causing severe rash involving mucus membranes or skin necrosis: Unknown Has patient had a PCN reaction that required hospitalization: No Has patient had a PCN reaction occurring within the last 10 years: No If all of the above answers are "NO", then may proceed with Cephalosporin use.  Marland Kitchen Penicillins     Pt does not think that she is allergic to this medication but is not sure     Metabolic Disorder Labs: No  results found for: HGBA1C, MPG No results found for: PROLACTIN No results found for: CHOL, TRIG, HDL, CHOLHDL, VLDL, LDLCALC Lab Results  Component Value Date   TSH 1.860 01/31/2015    Therapeutic Level Labs: No results found for: LITHIUM No results found for: VALPROATE No components found for:  CBMZ  Current Medications: Current Outpatient Medications  Medication Sig Dispense Refill  . sertraline (ZOLOFT) 50 MG tablet Take 50 mg by mouth daily.     No current facility-administered medications for this visit.    Medication Side Effects: none  Orders placed this visit:  No orders of the defined types were placed in this encounter.   Psychiatric Specialty Exam:  Review of Systems  Last menstrual period 11/30/2018, currently breastfeeding.There is no height or weight on file to calculate BMI.  General Appearance: Neat and Well Groomed  Eye Contact:  Good  Speech:  Clear and Coherent and Normal Rate  Volume:  Normal  Mood:  Anxious, Depressed and Irritable  Affect:  Appropriate and Congruent  Thought Process:  Coherent and Descriptions of Associations: Intact  Orientation:  Full (Time, Place, and Person)  Thought Content: Logical   Suicidal Thoughts:  No  Homicidal Thoughts:  No  Memory:  WNL  Judgement:  Good  Insight:  Good  Psychomotor Activity:  Normal  Concentration:  Concentration: Good  Recall:  Good  Fund of Knowledge: Good  Language: Good  Assets:  Communication Skills Desire for Improvement Financial Resources/Insurance Housing Intimacy Leisure Time Physical Health Resilience Social Support Talents/Skills Transportation Vocational/Educational  ADL's:  Intact  Cognition: WNL  Prognosis:  Good   Screenings:  None Receiving Psychotherapy: Yes Sammuel Cooper  Treatment Plan/Recommendations:   Plan:  PDMP reviewed  1. Add Lamictal 25mg  at hs x 14 days, then 50mg  at hs 2. Discontinue Zoloft 50mg  daily. 3. Add Luvox 50mg  daily x 7 days, then  increase to 100mg  daily.  RTC 4 weeks  Patient advised to contact office with any questions, adverse effects, or acute worsening in signs and symptoms.  Counseled patient regarding potential benefits, risks, and side effects of Lamictal to include potential risk of Stevens-Johnson syndrome. Advised patient to stop taking Lamictal and contact office immediately if rash develops and to seek urgent medical attention if rash is severe and/or spreading quickly.  Aloha Gell, NP

## 2019-11-08 ENCOUNTER — Ambulatory Visit: Payer: Managed Care, Other (non HMO)

## 2019-11-30 ENCOUNTER — Ambulatory Visit: Payer: 59 | Admitting: Adult Health

## 2019-12-21 ENCOUNTER — Ambulatory Visit: Payer: 59 | Admitting: Adult Health

## 2019-12-27 ENCOUNTER — Encounter: Payer: Self-pay | Admitting: Adult Health

## 2019-12-27 ENCOUNTER — Ambulatory Visit (INDEPENDENT_AMBULATORY_CARE_PROVIDER_SITE_OTHER): Payer: 59 | Admitting: Adult Health

## 2019-12-27 ENCOUNTER — Other Ambulatory Visit: Payer: Self-pay

## 2019-12-27 DIAGNOSIS — F422 Mixed obsessional thoughts and acts: Secondary | ICD-10-CM | POA: Diagnosis not present

## 2019-12-27 DIAGNOSIS — F319 Bipolar disorder, unspecified: Secondary | ICD-10-CM | POA: Diagnosis not present

## 2019-12-27 DIAGNOSIS — F331 Major depressive disorder, recurrent, moderate: Secondary | ICD-10-CM

## 2019-12-27 DIAGNOSIS — G4700 Insomnia, unspecified: Secondary | ICD-10-CM | POA: Diagnosis not present

## 2019-12-27 MED ORDER — FLUVOXAMINE MALEATE ER 100 MG PO CP24: 100.0000 mg | ORAL_CAPSULE | Freq: Every day | ORAL | 1 refills | Status: DC

## 2019-12-27 MED ORDER — LAMOTRIGINE 100 MG PO TABS: ORAL_TABLET | ORAL | 1 refills | Status: DC

## 2019-12-27 NOTE — Progress Notes (Signed)
Jill [name] [id number] 07-Jun-1986 34 y.o.  Subjective:   Patient ID:  Jill [name] is a 34 y.o. (DOB 1986/01/08) female.  Chief Complaint: No chief complaint on file.   HPI Jill [name] presents to the office today for follow-up of MDD, BPD1, insomnia, and OCD.  Describes mood today as "ok". Pleasant. Denies any recent "crying spells". Mood symptoms - reports anxiety. Getting "overwhelmed with things". Denies depression and irritability. Denies "manic" episodes. Reports obsessive behaviors - "not constant". Worries about "crashing" her car. Afraid something will happen to her son while she's away from him. Decreased spending. Works full time at [employer]. Mother-in-law taking care of son. Stable interest and motivation. Taking medications as prescribed.  Energy levels stable. Active, does not have a regular exercise routine. at Enjoys some usual interests and activities. Married. Lives with husband of 5 years and newborn son - 35 months old. Lives in [place]. Family local. Sister lives in [place]. Appetite adequate. Weight loss.  Sleeps well most nights. Averages 7 to 8 hours. Denies daytime napping.  Focus and concentration stable. Completing tasks. Managing aspects of household. Work going well.  Denies SI or HI. Denies AH or VH.  Previous medication trials: Lamictal, Zoloft, Luvox   Review of Systems:  Review of Systems  Musculoskeletal: Negative for gait problem.  Neurological: Negative for tremors.  Psychiatric/Behavioral:       Please refer to HPI    Medications: I have reviewed the patient's current medications.  Current Outpatient Medications  Medication Sig Dispense Refill  . Fluvoxamine Maleate 100 MG CP24 Take 1 capsule (100 mg total) by mouth daily. 90 capsule 1  . lamoTRIgine (LAMICTAL) 100 MG tablet Take one tablet at bedtime x 14 days, then take two tablets at bedtime. 90 tablet 1   No current facility-administered medications for this  visit.    Medication Side Effects: None  Allergies:  Allergies  Allergen Reactions  . Amoxicillin Anaphylaxis, Shortness Of Breath and Rash    Childhood reaction  Has patient had a PCN reaction causing immediate rash, facial/tongue/throat swelling, SOB or lightheadedness with hypotension: Yes Has patient had a PCN reaction causing severe rash involving mucus membranes or skin necrosis: Unknown Has patient had a PCN reaction that required hospitalization: No Has patient had a PCN reaction occurring within the last 10 years: No If all of the above answers are "NO", then may proceed with Cephalosporin use.  Marland Kitchen Penicillins     Pt does not think that she is allergic to this medication but is not sure     Past Medical History:  Diagnosis Date  . Bipolar 1 disorder (HCC)   . Depression   . Migraine   . OCD (obsessive compulsive disorder)   . TMJ (dislocation of temporomandibular joint)     Family History  Problem Relation Age of Onset  . Depression Mother   . Alcohol abuse Father   . Lung cancer Maternal Grandmother   . Hypertension Maternal Grandmother   . Hypertension Maternal Grandfather   . Stroke Maternal Grandfather   . Hypertension Paternal Grandmother   . Hypertension Paternal Grandfather   . Breast cancer Neg Hx     Social History   Socioeconomic History  . Marital status: Married    Spouse name: [name]  . Number of children: Not on file  . Years of education: Not on file  . Highest education level: Not on file  Occupational History  . Occupation: Lab assisstant  Tobacco Use  .  Smoking status: Never Smoker  . Smokeless tobacco: Never Used  Substance and Sexual Activity  . Alcohol use: No    Alcohol/week: 0.0 standard drinks  . Drug use: No  . Sexual activity: Not Currently    Partners: Male    Birth control/protection: None  Other Topics Concern  . Not on file  Social History Narrative   She is originally from [place] and graduated high school in  [place]. She has been married for approximately 2years and has no children. She has been working for [employer] for the past one month. She currently lives with her husband. She denies any physical or sexual abuse.   Social Determinants of Health   Financial Resource Strain:   . Difficulty of Paying Living Expenses:   Food Insecurity:   . Worried About Charity fundraiser in the Last Year:   . Arboriculturist in the Last Year:   Transportation Needs:   . Film/video editor (Medical):   Marland Kitchen Lack of Transportation (Non-Medical):   Physical Activity:   . Days of Exercise per Week:   . Minutes of Exercise per Session:   Stress:   . Feeling of Stress :   Social Connections:   . Frequency of Communication with Friends and Family:   . Frequency of Social Gatherings with Friends and Family:   . Attends Religious Services:   . Active Member of Clubs or Organizations:   . Attends Archivist Meetings:   Marland Kitchen Marital Status:   Intimate Partner Violence:   . Fear of Current or Ex-Partner:   . Emotionally Abused:   Marland Kitchen Physically Abused:   . Sexually Abused:     Past Medical History, Surgical history, Social history, and Family history were reviewed and updated as appropriate.   Please see review of systems for further details on the patient's review from today.   Objective:   Physical Exam:  There were no vitals taken for this visit.  Physical Exam Constitutional:      General: She is not in acute distress. Musculoskeletal:        General: No deformity.  Neurological:     Mental Status: She is alert and oriented to person, place, and time.     Coordination: Coordination normal.  Psychiatric:        Attention and Perception: Attention and perception normal. She does not perceive auditory or visual hallucinations.        Mood and Affect: Mood is anxious. Mood is not depressed. Affect is not labile, blunt, angry or inappropriate.        Speech: Speech normal.         Behavior: Behavior normal.        Thought Content: Thought content normal. Thought content is not paranoid or delusional. Thought content does not include homicidal or suicidal ideation. Thought content does not include homicidal or suicidal plan.        Cognition and Memory: Cognition and memory normal.        Judgment: Judgment normal.     Comments: Insight intact     Lab Review:     Component Value Date/Time   NA 139 08/19/2019 1922   NA 139 01/31/2015 1329   K 3.2 (L) 08/19/2019 1922   K 3.7 01/31/2015 1329   CL 106 08/19/2019 1922   CL 107 01/31/2015 1329   CO2 21 (L) 08/19/2019 1922   CO2 26 01/31/2015 1329   GLUCOSE 85 08/19/2019 1922  GLUCOSE 86 01/31/2015 1329   BUN <5 (L) 08/19/2019 1922   BUN 9 01/31/2015 1329   CREATININE 0.63 08/21/2019 2116   CREATININE 0.63 01/31/2015 1329   CALCIUM 8.9 08/19/2019 1922   CALCIUM 8.8 (L) 01/31/2015 1329   PROT 6.3 (L) 08/19/2019 1922   PROT 7.6 01/31/2015 1329   ALBUMIN 2.4 (L) 08/19/2019 1922   ALBUMIN 3.8 01/31/2015 1329   AST 18 08/19/2019 1922   AST 24 01/31/2015 1329   ALT 16 08/19/2019 1922   ALT 19 01/31/2015 1329   ALKPHOS 119 08/19/2019 1922   ALKPHOS 73 01/31/2015 1329   BILITOT 0.4 08/19/2019 1922   BILITOT 0.7 01/31/2015 1329   GFRNONAA >60 08/21/2019 2116   GFRNONAA >60 01/31/2015 1329   GFRAA >60 08/21/2019 2116   GFRAA >60 01/31/2015 1329       Component Value Date/Time   WBC 17.0 (H) 08/22/2019 0741   RBC 3.56 (L) 08/22/2019 0741   HGB 10.1 (L) 08/22/2019 0741   HGB 11.7 06/02/2019 0932   HCT 30.7 (L) 08/22/2019 0741   HCT 33.8 (L) 06/02/2019 0932   PLT 319 08/22/2019 0741   PLT 314 06/02/2019 0932   MCV 86.2 08/22/2019 0741   MCV 82 06/02/2019 0932   MCV 85 01/31/2015 1329   MCH 28.4 08/22/2019 0741   MCHC 32.9 08/22/2019 0741   RDW 14.7 08/22/2019 0741   RDW 13.2 06/02/2019 0932   RDW 13.4 01/31/2015 1329   LYMPHSABS 2.5 06/02/2019 0932   MONOABS 0.7 02/24/2018 0405   EOSABS 0.1  06/02/2019 0932   BASOSABS 0.0 06/02/2019 0932    No results found for: POCLITH, LITHIUM   No results found for: PHENYTOIN, PHENOBARB, VALPROATE, CBMZ   .res Assessment: Plan:   Plan:  PDMP reviewed  Lamictal 100mg  at hs Luvox 50mg  BID to 100mg  CR daily  Will fill out paperwork for intermittent leave   RTC 4 weeks  Patient advised to contact office with any questions, adverse effects, or acute worsening in signs and symptoms.  Counseled patient regarding potential benefits, risks, and side effects of Lamictal to include potential risk of Stevens-Johnson syndrome. Advised patient to stop taking Lamictal and contact office immediately if rash develops and to seek urgent medical attention if rash is severe and/or spreading quickly.  Diagnoses and all orders for this visit:  Mixed obsessional thoughts and acts -     Fluvoxamine Maleate 100 MG CP24; Take 1 capsule (100 mg total) by mouth daily.  Insomnia, unspecified type -     lamoTRIgine (LAMICTAL) 100 MG tablet; Take one tablet at bedtime x 14 days, then take two tablets at bedtime.  Bipolar I disorder (HCC) -     lamoTRIgine (LAMICTAL) 100 MG tablet; Take one tablet at bedtime x 14 days, then take two tablets at bedtime.  Major depressive disorder, recurrent episode, moderate (HCC) -     Fluvoxamine Maleate 100 MG CP24; Take 1 capsule (100 mg total) by mouth daily.     Please see After Visit Summary for patient specific instructions.  Future Appointments  Date Time Provider Department Center  02/02/2020  3:30 PM , MD [facility] None    No orders of the defined types were placed in this encounter.   -------------------------------

## 2020-01-26 ENCOUNTER — Ambulatory Visit: Payer: 59 | Admitting: Adult Health

## 2020-02-02 ENCOUNTER — Ambulatory Visit: Payer: Self-pay | Admitting: Obstetrics and Gynecology

## 2020-02-07 ENCOUNTER — Ambulatory Visit: Payer: 59 | Admitting: Adult Health

## 2020-02-21 ENCOUNTER — Other Ambulatory Visit (HOSPITAL_COMMUNITY)
Admission: RE | Admit: 2020-02-21 | Discharge: 2020-02-21 | Disposition: A | Discharge status: Home / Self Care | Payer: Managed Care, Other (non HMO) | Source: Ambulatory Visit | Attending: Obstetrics and Gynecology | Admitting: Obstetrics and Gynecology

## 2020-02-21 ENCOUNTER — Encounter: Payer: Self-pay | Admitting: Obstetrics and Gynecology

## 2020-02-21 ENCOUNTER — Ambulatory Visit (INDEPENDENT_AMBULATORY_CARE_PROVIDER_SITE_OTHER): Payer: Managed Care, Other (non HMO) | Admitting: Obstetrics and Gynecology

## 2020-02-21 ENCOUNTER — Other Ambulatory Visit: Payer: Self-pay

## 2020-02-21 VITALS — BP 124/78 | Ht 69.0 in | Wt 285.0 lb

## 2020-02-21 DIAGNOSIS — Z124 Encounter for screening for malignant neoplasm of cervix: Secondary | ICD-10-CM | POA: Diagnosis present

## 2020-02-21 DIAGNOSIS — Z1329 Encounter for screening for other suspected endocrine disorder: Secondary | ICD-10-CM | POA: Diagnosis not present

## 2020-02-21 DIAGNOSIS — Z01419 Encounter for gynecological examination (general) (routine) without abnormal findings: Secondary | ICD-10-CM

## 2020-02-21 DIAGNOSIS — G43109 Migraine with aura, not intractable, without status migrainosus: Secondary | ICD-10-CM

## 2020-02-21 DIAGNOSIS — Z0000 Encounter for general adult medical examination without abnormal findings: Secondary | ICD-10-CM

## 2020-02-21 DIAGNOSIS — Z130 Encounter for screening for diseases of the blood and blood-forming organs and certain disorders involving the immune mechanism: Secondary | ICD-10-CM | POA: Diagnosis not present

## 2020-02-21 NOTE — Progress Notes (Signed)
Gynecology Annual Exam   PCP: Maeola Sarah, MD  Chief Complaint:  Chief Complaint  Patient presents with  . Gynecologic Exam    History of Present Illness: Patient is a 34 y.o. [id number] presents for annual exam. The patient has no complaints today.   LMP: Patient's last menstrual period was 02/14/2020. Average Interval: regular, monthly Duration of flow: 5 days Heavy Menses: yes Clots: yes Intermenstrual Bleeding: no Postcoital Bleeding: no Dysmenorrhea: yes  Reports changing menstrual products every hour, reports gushing and flooding sensations, reports passing large clots. Misses for 4 times a year for bleeding and migraines with aura and menstrual migraines  The patient is sexually active. She currently uses condoms for contraception. She denies dyspareunia.  The patient does perform self breast exams.  There is no notable family history of breast or ovarian cancer in her family.  The patient has regular exercise: no.    The patient is being treated for anxiety by psychiatry. Has restarted lamictal and is doing well. Some recent stress with need to move.  GAD-7: 7 PHQ-9: 5  Review of Systems: Review of Systems  Constitutional: Positive for malaise/fatigue. Negative for chills, fever and weight loss.  HENT: Negative for congestion, hearing loss and sinus pain.   Eyes: Negative for blurred vision and double vision.  Respiratory: Negative for cough, sputum production, shortness of breath and wheezing.   Cardiovascular: Negative for chest pain, palpitations, orthopnea and leg swelling.  Gastrointestinal: Negative for abdominal pain, constipation, diarrhea, nausea and vomiting.  Genitourinary: Negative for dysuria, flank pain, frequency, hematuria and urgency.  Musculoskeletal: Negative for back pain, falls and joint pain.  Skin: Negative for itching and rash.  Neurological: Positive for headaches. Negative for dizziness.  Psychiatric/Behavioral: Negative for depression,  substance abuse and suicidal ideas. The patient is nervous/anxious.     Past Medical History:  Patient Active Problem List   Diagnosis Date Noted  . Status post cesarean delivery 08/21/2019  . Bilateral lower extremity edema 08/19/2019  . Severe preeclampsia 08/19/2019  . Incomplete abortion 03/03/2018  . Bipolar 1 disorder ([place]) 02/23/2018  . Supervision of high risk pregnancy, antepartum 01/13/2018    Clinic [facility] Prenatal Labs  Dating  LMP = 10wk [place] Blood type: AB/Positive/-- (03/30 1144)   Genetic Screen  NIPS:Normal XY Antibody:Negative (03/30 1144)  Anatomic [place] complete Rubella: <0.90 (03/30 1144)  Varicella: Immune  GTT Early:106        Third trimester: 126 RPR: Non Reactive (08/26 0932)   Rhogam  not needed HBsAg: Negative (03/30 1144)   TDaP vaccine   06/30/2019                     Flu Shot:06/16/2019 HIV: Non Reactive (08/26 0932)   Baby Food   Breast                             GBS: negative  Contraception  Pap: 2018 NIL  CBB     CS/VBAC    Support Person         . BMI 40.0-44.9, adult ([place]) 01/13/2018  . Endometriosis determined by laparoscopy 03/31/2017  . Pelvic pain in female 02/20/2017  . Dyspareunia in female 02/01/2017  . Menorrhagia with regular cycle 02/01/2017  . Dysmenorrhea 02/01/2017  . Major depressive disorder, recurrent, severe without psychotic features ([place])   . OCD (obsessive compulsive disorder) 02/05/2015  . 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  Bipolar 2 disorder, major depressive episode ([facility]) 02/04/2015    Past Surgical History:  Past Surgical History:  Procedure Laterality Date  . CESAREAN SECTION N/A 08/21/2019   Procedure: CESAREAN SECTION;  Surgeon: Conard Novak, MD;  Location: [facility] ORS;  Service: Obstetrics;  Laterality: N/A;  . DILATION AND EVACUATION N/A 03/03/2018   Procedure: DILATATION AND EVACUATION;  Surgeon: Nadara Mustard, MD;  Location: [facility] ORS;  Service: Gynecology;  Laterality: N/A;  . LAPAROSCOPIC  LYSIS OF ADHESIONS  03/21/2017   Procedure: LAPAROSCOPIC LYSIS OF ADHESIONS;  Surgeon: Conard Novak, MD;  Location: [facility] ORS;  Service: Gynecology;;  . LAPAROSCOPY N/A 03/21/2017   Procedure: LAPAROSCOPY DIAGNOSTIC/FULGERATION OF ENDOMETRIAL IMPLANTS;  Surgeon: Conard Novak, MD;  Location: [facility] ORS;  Service: Gynecology;  Laterality: N/A;  . None      Gynecologic History:  Patient's last menstrual period was 02/14/2020. Contraception: condoms Last Pap: Results were: 2018 NIl   Obstetric History: [id number]  Family History:  Family History  Problem Relation Age of Onset  . Depression Mother   . Alcohol abuse Father   . Lung cancer Maternal Grandmother   . Hypertension Maternal Grandmother   . Hypertension Maternal Grandfather   . Stroke Maternal Grandfather   . Hypertension Paternal Grandmother   . Hypertension Paternal Grandfather   . Breast cancer Neg Hx     Social History:  Social History   Socioeconomic History  . Marital status: Married    Spouse name: [name]  . Number of children: Not on file  . Years of education: Not on file  . Highest education level: Not on file  Occupational History  . Occupation: Lab assisstant  Tobacco Use  . Smoking status: Never Smoker  . Smokeless tobacco: Never Used  Substance and Sexual Activity  . Alcohol use: No    Alcohol/week: 0.0 standard drinks  . Drug use: No  . Sexual activity: Yes    Partners: Male    Birth control/protection: None  Other Topics Concern  . Not on file  Social History Narrative   She is originally from [place] and graduated high school in [place]. She has been married for approximately 2years and has no children. She has been working for [employer] for the past one month. She currently lives with her husband. She denies any physical or sexual abuse.   Social Determinants of Health   Financial Resource Strain:   . Difficulty of Paying Living Expenses:   Food Insecurity:   . Worried About  Programme researcher, broadcasting/film/video in the Last Year:   . Barista in the Last Year:   Transportation Needs:   . Freight forwarder (Medical):   Marland Kitchen Lack of Transportation (Non-Medical):   Physical Activity:   . Days of Exercise per Week:   . Minutes of Exercise per Session:   Stress:   . Feeling of Stress :   Social Connections:   . Frequency of Communication with Friends and Family:   . Frequency of Social Gatherings with Friends and Family:   . Attends Religious Services:   . Active Member of Clubs or Organizations:   . Attends Banker Meetings:   Marland Kitchen Marital Status:   Intimate Partner Violence:   . Fear of Current or Ex-Partner:   . Emotionally Abused:   Marland Kitchen Physically Abused:   . Sexually Abused:     Allergies:  Allergies  Allergen Reactions  . Amoxicillin Anaphylaxis, Shortness Of Breath and Rash  Childhood reaction  Has patient had a PCN reaction causing immediate rash, facial/tongue/throat swelling, SOB or lightheadedness with hypotension: Yes Has patient had a PCN reaction causing severe rash involving mucus membranes or skin necrosis: Unknown Has patient had a PCN reaction that required hospitalization: No Has patient had a PCN reaction occurring within the last 10 years: No If all of the above answers are "NO", then may proceed with Cephalosporin use.  Marland Kitchen Penicillins     Pt does not think that she is allergic to this medication but is not sure     Medications: Prior to Admission medications   Medication Sig Start Date End Date Taking? Authorizing Provider  Fluvoxamine Maleate 100 MG CP24 Take 1 capsule (100 mg total) by mouth daily. 12/27/19  Yes Mozingo, Thereasa Solo, NP  lamoTRIgine (LAMICTAL) 100 MG tablet Take 100 mg by mouth 2 (two) times daily.   Yes [provider]    Physical Exam Vitals: Blood pressure 124/78, height 5\' 9"  (1.753 m), weight 285 lb (129.3 kg), last menstrual period 02/14/2020, currently breastfeeding.  General:  NAD HEENT: normocephalic, anicteric Thyroid: no enlargement, no palpable nodules Pulmonary: No increased work of breathing, CTAB Cardiovascular: RRR, distal pulses 2+ Breast: Breast symmetrical, no tenderness, no palpable nodules or masses, no skin or nipple retraction present, no nipple discharge.  No axillary or supraclavicular lymphadenopathy. Abdomen: NABS, soft, non-tender, non-distended.  Umbilicus without lesions.  No hepatomegaly, splenomegaly or masses palpable. No evidence of hernia  Genitourinary:  External: Normal external female genitalia.  Normal urethral meatus, normal Bartholin's and Skene's glands.    Vagina: Normal vaginal mucosa, no evidence of prolapse.    Cervix: Grossly normal in appearance, no bleeding  Uterus: Non-enlarged, mobile, normal contour.  No CMT  Adnexa: ovaries non-enlarged, no adnexal masses  Rectal: deferred  Lymphatic: no evidence of inguinal lymphadenopathy Extremities: no edema, erythema, or tenderness Neurologic: Grossly intact Psychiatric: mood appropriate, affect full  Female chaperone present for pelvic and breast  portions of the physical exam    Assessment: 34 y.o. 20 routine annual exam  Plan: Problem List Items Addressed This Visit    None    Visit Diagnoses    Encounter for annual routine gynecological examination    -  Primary   Health maintenance examination       Screening for thyroid disorder       Relevant Orders   TSH + free T4   Screening for deficiency anemia       Relevant Orders   CBC With Differential   Cervical cancer screening       Relevant Orders   Cytology - PAP   Migraine with aura and without status migrainosus, not intractable       Relevant Medications   lamoTRIgine (LAMICTAL) 100 MG tablet   Other Relevant Orders   Ambulatory referral to Neurology      2) STI screening  wasoffered and declined  2)  ASCCP guidelines and rational discussed.  Patient opts for every 5 years screening  interval  3) Contraception - the patient is currently using  condoms.  She is happy with her current form of contraception and plans to continue  4) Routine healthcare maintenance including cholesterol, diabetes screening discussed Ordered today  5) Migraines and menstrual headaches, referred to neurology.   6) Return in about 1 year (around 02/20/2021) for annual.  02/22/2021 MD, Adelene Idler OB/GYN, [facility] 02/21/2020 11:02 AM

## 2020-02-21 NOTE — Patient Instructions (Signed)
Institute of Medicine Recommended Dietary Allowances for Calcium and Vitamin D  Age (yr) Calcium Recommended Dietary Allowance (mg/day) Vitamin D Recommended Dietary Allowance (international units/day)  9-18 1,300 600  19-50 1,000 600  51-70 1,200 600  71 and older 1,200 800  Data from Institute of Medicine. Dietary reference intakes: calcium, vitamin D. [place], [place]: National Academies Press; 2011.     Exercising to Stay Healthy To become healthy and stay healthy, it is recommended that you do moderate-intensity and vigorous-intensity exercise. You can tell that you are exercising at a moderate intensity if your heart starts beating faster and you start breathing faster but can still hold a conversation. You can tell that you are exercising at a vigorous intensity if you are breathing much harder and faster and cannot hold a conversation while exercising. Exercising regularly is important. It has many health benefits, such as:  Improving overall fitness, flexibility, and endurance.  Increasing bone density.  Helping with weight control.  Decreasing body fat.  Increasing muscle strength.  Reducing stress and tension.  Improving overall health. How often should I exercise? Choose an activity that you enjoy, and set realistic goals. Your health care provider can help you make an activity plan that works for you. Exercise regularly as told by your health care provider. This may include:  Doing strength training two times a week, such as: ? Lifting weights. ? Using resistance bands. ? Push-ups. ? Sit-ups. ? Yoga.  Doing a certain intensity of exercise for a given amount of time. Choose from these options: ? A total of 150 minutes of moderate-intensity exercise every week. ? A total of 75 minutes of vigorous-intensity exercise every week. ? A mix of moderate-intensity and vigorous-intensity exercise every week. Children, pregnant women, people who have not exercised  regularly, people who are overweight, and older adults may need to talk with a health care provider about what activities are safe to do. If you have a medical condition, be sure to talk with your health care provider before you start a new exercise program. What are some exercise ideas? Moderate-intensity exercise ideas include:  Walking 1 mile (1.6 km) in about 15 minutes.  Biking.  Hiking.  Golfing.  Dancing.  Water aerobics. Vigorous-intensity exercise ideas include:  Walking 4.5 miles (7.2 km) or more in about 1 hour.  Jogging or running 5 miles (8 km) in about 1 hour.  Biking 10 miles (16.1 km) or more in about 1 hour.  Lap swimming.  Roller-skating or in-line skating.  Cross-country skiing.  Vigorous competitive sports, such as football, basketball, and soccer.  Jumping rope.  Aerobic dancing. What are some everyday activities that can help me to get exercise?  Yard work, such as: ? Pushing a lawn mower. ? Raking and bagging leaves.  Washing your car.  Pushing a stroller.  Shoveling snow.  Gardening.  Washing windows or floors. How can I be more active in my day-to-day activities?  Use stairs instead of an elevator.  Take a walk during your lunch break.  If you drive, park your car farther away from your work or school.  If you take public transportation, get off one stop early and walk the rest of the way.  Stand up or walk around during all of your indoor phone calls.  Get up, stretch, and walk around every 30 minutes throughout the day.  Enjoy exercise with a friend. Support to continue exercising will help you keep a regular routine of activity. What guidelines can   I follow while exercising?  Before you start a new exercise program, talk with your health care provider.  Do not exercise so much that you hurt yourself, feel dizzy, or get very short of breath.  Wear comfortable clothes and wear shoes with good support.  Drink plenty of  water while you exercise to prevent dehydration or heat stroke.  Work out until your breathing and your heartbeat get faster. Where to find more information  [place] Department of Health and Human Services: www.hhs.gov  Centers for Disease Control and Prevention (CDC): www.cdc.gov Summary  Exercising regularly is important. It will improve your overall fitness, flexibility, and endurance.  Regular exercise also will improve your overall health. It can help you control your weight, reduce stress, and improve your bone density.  Do not exercise so much that you hurt yourself, feel dizzy, or get very short of breath.  Before you start a new exercise program, talk with your health care provider. This information is not intended to replace advice given to you by your health care provider. Make sure you discuss any questions you have with your health care provider. Document Revised: 09/05/2017 Document Reviewed: 08/14/2017 Elsevier Patient Education  2020 Elsevier Inc.   Budget-Friendly Healthy Eating There are many ways to save money at the grocery store and continue to eat healthy. You can be successful if you:  Plan meals according to your budget.  Make a grocery list and only purchase food according to your grocery list.  Prepare food yourself. What are tips for following this plan?  Reading food labels  Compare food labels between brand name foods and the store brand. Often the nutritional value is the same, but the store brand is lower cost.  Look for products that do not have added sugar, fat, or salt (sodium). These often cost the same but are healthier for you. Products may be labeled as: ? Sugar-free. ? Nonfat. ? Low-fat. ? Sodium-free. ? Low-sodium.  Look for lean ground beef labeled as at least 92% lean and 8% fat. Shopping  Buy only the items on your grocery list and go only to the areas of the store that have the items on your list.  Use coupons only for foods  and brands you normally buy. Avoid buying items you wouldn't normally buy simply because they are on sale.  Check online and in newspapers for weekly deals.  Buy healthy items from the bulk bins when available, such as herbs, spices, flour, pasta, nuts, and dried fruit.  Buy fruits and vegetables that are in season. Prices are usually lower on in-season produce.  Look at the unit price on the price tag. Use it to compare different brands and sizes to find out which item is the best deal.  Choose healthy items that are often low-cost, such as carrots, potatoes, apples, bananas, and oranges. Dried or canned beans are a low-cost protein source.  Buy in bulk and freeze extra food. Items you can buy in bulk include meats, fish, poultry, frozen fruits, and frozen vegetables.  Avoid buying "ready-to-eat" foods, such as pre-cut fruits and vegetables and pre-made salads.  If possible, shop around to discover where you can find the best prices. Consider other retailers such as dollar stores, larger wholesale stores, local fruit and vegetable stands, and farmers markets.  Do not shop when you are hungry. If you shop while hungry, it may be hard to stick to your list and budget.  Resist impulse buying. Use your grocery list as   your official plan for the week.  Buy a variety of vegetables and fruits by purchasing fresh, frozen, and canned items.  Look at the top and bottom shelves for deals. Foods at eye level (eye level of an adult or child) are usually more expensive.  Be efficient with your time when shopping. The more time you spend at the store, the more money you are likely to spend.  To save money when choosing more expensive foods like meats and dairy: ? Choose cheaper cuts of meat, such as bone-in chicken thighs and drumsticks instead of skinless and boneless chicken. When you are ready to prepare the chicken, you can remove the skin yourself to make it healthier. ? Choose lean meats like  chicken or [place] instead of beef. ? Choose canned seafood, such as tuna, salmon, or sardines. ? Buy eggs as a low-cost source of protein. ? Buy dried beans and peas, such as lentils, split peas, or kidney beans instead of meats. Dried beans and peas are a good alternative source of protein. ? Buy the larger tubs of yogurt instead of individual-sized containers.  Choose water instead of sodas and other sweetened beverages.  Avoid buying chips, cookies, and other "junk food." These items are usually expensive and not healthy. Cooking  Make extra food and freeze the extras in meal-sized containers or in individual portions for fast meals and snacks.  Pre-cook on days when you have extra time to prepare meals in advance. You can keep these meals in the fridge or freezer and reheat for a quick meal.  When you come home from the grocery store, wash, peel, and cut fruits and vegetables so they are ready to use and eat. This will help reduce food waste. Meal planning  Do not eat out or get fast food. Prepare food at home.  Make a grocery list and make sure to bring it with you to the store. If you have a smart phone, you could use your phone to create your shopping list.  Plan meals and snacks according to a grocery list and budget you create.  Use leftovers in your meal plan for the week.  Look for recipes where you can cook once and make enough food for two meals.  Include budget-friendly meals like stews, casseroles, and stir-fry dishes.  Try some meatless meals or try "no cook" meals like salads.  Make sure that half your plate is filled with fruits or vegetables. Choose from fresh, frozen, or canned fruits and vegetables. If eating canned, remember to rinse them before eating. This will remove any excess salt added for packaging. Summary  Eating healthy on a budget is possible if you plan your meals according to your budget, purchase according to your budget and grocery list, and  prepare food yourself.  Tips for buying more food on a limited budget include buying generic brands, using coupons only for foods you normally buy, and buying healthy items from the bulk bins when available.  Tips for buying cheaper food to replace expensive food include choosing cheaper, lean cuts of meat, and buying dried beans and peas. This information is not intended to replace advice given to you by your health care provider. Make sure you discuss any questions you have with your health care provider. Document Revised: 09/24/2017 Document Reviewed: 09/24/2017 Elsevier Patient Education  2020 Elsevier Inc.   Managing Anxiety, Adult After being diagnosed with an anxiety disorder, you may be relieved to know why you have felt or behaved a  certain way. You may also feel overwhelmed about the treatment ahead and what it will mean for your life. With care and support, you can manage this condition and recover from it. How to manage lifestyle changes Managing stress and anxiety  Stress is your body's reaction to life changes and events, both good and bad. Most stress will last just a few hours, but stress can be ongoing and can lead to more than just stress. Although stress can play a major role in anxiety, it is not the same as anxiety. Stress is usually caused by something external, such as a deadline, test, or competition. Stress normally passes after the triggering event has ended.  Anxiety is caused by something internal, such as imagining a terrible outcome or worrying that something will go wrong that will devastate you. Anxiety often does not go away even after the triggering event is over, and it can become long-term (chronic) worry. It is important to understand the differences between stress and anxiety and to manage your stress effectively so that it does not lead to an anxious response. Talk with your health care provider or a counselor to learn more about reducing anxiety and stress.  He or she may suggest tension reduction techniques, such as:  Music therapy. This can include creating or listening to music that you enjoy and that inspires you.  Mindfulness-based meditation. This involves being aware of your normal breaths while not trying to control your breathing. It can be done while sitting or walking.  Centering prayer. This involves focusing on a word, phrase, or sacred image that means something to you and brings you peace.  Deep breathing. To do this, expand your stomach and inhale slowly through your nose. Hold your breath for 3-5 seconds. Then exhale slowly, letting your stomach muscles relax.  Self-talk. This involves identifying thought patterns that lead to anxiety reactions and changing those patterns.  Muscle relaxation. This involves tensing muscles and then relaxing them. Choose a tension reduction technique that suits your lifestyle and personality. These techniques take time and practice. Set aside 5-15 minutes a day to do them. Therapists can offer counseling and training in these techniques. The training to help with anxiety may be covered by some insurance plans. Other things you can do to manage stress and anxiety include:  Keeping a stress/anxiety diary. This can help you learn what triggers your reaction and then learn ways to manage your response.  Thinking about how you react to certain situations. You may not be able to control everything, but you can control your response.  Making time for activities that help you relax and not feeling guilty about spending your time in this way.  Visual imagery and yoga can help you stay calm and relax.  Medicines Medicines can help ease symptoms. Medicines for anxiety include:  Anti-anxiety drugs.  Antidepressants. Medicines are often used as a primary treatment for anxiety disorder. Medicines will be prescribed by a health care provider. When used together, medicines, psychotherapy, and tension  reduction techniques may be the most effective treatment. Relationships Relationships can play a big part in helping you recover. Try to spend more time connecting with trusted friends and family members. Consider going to couples counseling, taking family education classes, or going to family therapy. Therapy can help you and others better understand your condition. How to recognize changes in your anxiety Everyone responds differently to treatment for anxiety. Recovery from anxiety happens when symptoms decrease and stop interfering with your daily activities  at home or work. This may mean that you will start to:  Have better concentration and focus. Worry will interfere less in your daily thinking.  Sleep better.  Be less irritable.  Have more energy.  Have improved memory. It is important to recognize when your condition is getting worse. Contact your health care provider if your symptoms interfere with home or work and you feel like your condition is not improving. Follow these instructions at home: Activity  Exercise. Most adults should do the following: ? Exercise for at least 150 minutes each week. The exercise should increase your heart rate and make you sweat (moderate-intensity exercise). ? Strengthening exercises at least twice a week.  Get the right amount and quality of sleep. Most adults need 7-9 hours of sleep each night. Lifestyle   Eat a healthy diet that includes plenty of vegetables, fruits, whole grains, low-fat dairy products, and lean protein. Do not eat a lot of foods that are high in solid fats, added sugars, or salt.  Make choices that simplify your life.  Do not use any products that contain nicotine or tobacco, such as cigarettes, e-cigarettes, and chewing tobacco. If you need help quitting, ask your health care provider.  Avoid caffeine, alcohol, and certain over-the-counter cold medicines. These may make you feel worse. Ask your pharmacist which  medicines to avoid. General instructions  Take over-the-counter and prescription medicines only as told by your health care provider.  Keep all follow-up visits as told by your health care provider. This is important. Where to find support You can get help and support from these sources:  Self-help groups.  Online and [employer].  A trusted spiritual leader.  Couples counseling.  Family education classes.  Family therapy. Where to find more information You may find that joining a support group helps you deal with your anxiety. The following sources can help you locate counselors or support groups near you:  Mental Health America: www.mentalhealthamerica.net  Anxiety and Depression Association of [place] (ADAA): ProgramCam.de  [employer] Mental Illness (NAMI): www.nami.org Contact a health care provider if you:  Have a hard time staying focused or finishing daily tasks.  Spend many hours a day feeling worried about everyday life.  Become exhausted by worry.  Start to have headaches, feel tense, or have nausea.  Urinate more than normal.  Have diarrhea. Get help right away if you have:  A racing heart and shortness of breath.  Thoughts of hurting yourself or others. If you ever feel like you may hurt yourself or others, or have thoughts about taking your own life, get help right away. You can go to your nearest emergency department or call:  Your local emergency services (911 in the [place]  A suicide crisis helpline, such as the National Suicide Prevention Lifeline at 959-085-2303. This is open 24 hours a day. Summary  Taking steps to learn and use tension reduction techniques can help calm you and help prevent triggering an anxiety reaction.  When used together, medicines, psychotherapy, and tension reduction techniques may be the most effective treatment.  Family, friends, and partners can play a big part in helping you recover from an  anxiety disorder. This information is not intended to replace advice given to you by your health care provider. Make sure you discuss any questions you have with your health care provider. Document Revised: 02/23/2019 Document Reviewed: 02/23/2019 Elsevier Patient Education  2020 Elsevier Inc.   Migraine Headache A migraine headache is an intense, throbbing  pain on one side or both sides of the head. Migraine headaches may also cause other symptoms, such as nausea, vomiting, and sensitivity to light and noise. A migraine headache can last from 4 hours to 3 days. Talk with your doctor about what things may bring on (trigger) your migraine headaches. What are the causes? The exact cause of this condition is not known. However, a migraine may be caused when nerves in the brain become irritated and release chemicals that cause inflammation of blood vessels. This inflammation causes pain. This condition may be triggered or caused by:  Drinking alcohol.  Smoking.  Taking medicines, such as: ? Medicine used to treat chest pain (nitroglycerin). ? Birth control pills. ? Estrogen. ? Certain blood pressure medicines.  Eating or drinking products that contain nitrates, glutamate, aspartame, or tyramine. Aged cheeses, chocolate, or caffeine may also be triggers.  Doing physical activity. Other things that may trigger a migraine headache include:  Menstruation.  Pregnancy.  Hunger.  Stress.  Lack of sleep or too much sleep.  Weather changes.  Fatigue. What increases the risk? The following factors may make you more likely to experience migraine headaches:  Being a certain age. This condition is more common in people who are 36-29 years old.  Being female.  Having a family history of migraine headaches.  Being Caucasian.  Having a mental health condition, such as depression or anxiety.  Being obese. What are the signs or symptoms? The main symptom of this condition is  pulsating or throbbing pain. This pain may:  Happen in any area of the head, such as on one side or both sides.  Interfere with daily activities.  Get worse with physical activity.  Get worse with exposure to bright lights or loud noises. Other symptoms may include:  Nausea.  Vomiting.  Dizziness.  General sensitivity to bright lights, loud noises, or smells. Before you get a migraine headache, you may get warning signs (an aura). An aura may include:  Seeing flashing lights or having blind spots.  Seeing bright spots, halos, or zigzag lines.  Having tunnel vision or blurred vision.  Having numbness or a tingling feeling.  Having trouble talking.  Having muscle weakness. Some people have symptoms after a migraine headache (postdromal phase), such as:  Feeling tired.  Difficulty concentrating. How is this diagnosed? A migraine headache can be diagnosed based on:  Your symptoms.  A physical exam.  Tests, such as: ? CT scan or an MRI of the head. These imaging tests can help rule out other causes of headaches. ? Taking fluid from the spine (lumbar puncture) and analyzing it (cerebrospinal fluid analysis, or CSF analysis). How is this treated? This condition may be treated with medicines that:  Relieve pain.  Relieve nausea.  Prevent migraine headaches. Treatment for this condition may also include:  Acupuncture.  Lifestyle changes like avoiding foods that trigger migraine headaches.  Biofeedback.  Cognitive behavioral therapy. Follow these instructions at home: Medicines  Take over-the-counter and prescription medicines only as told by your health care provider.  Ask your health care provider if the medicine prescribed to you: ? Requires you to avoid driving or using heavy machinery. ? Can cause constipation. You may need to take these actions to prevent or treat constipation:  Drink enough fluid to keep your urine pale yellow.  Take  over-the-counter or prescription medicines.  Eat foods that are high in fiber, such as beans, whole grains, and fresh fruits and vegetables.  Limit foods that  are high in fat and processed sugars, such as fried or sweet foods. Lifestyle  Do not drink alcohol.  Do not use any products that contain nicotine or tobacco, such as cigarettes, e-cigarettes, and chewing tobacco. If you need help quitting, ask your health care provider.  Get at least 8 hours of sleep every night.  Find ways to manage stress, such as meditation, deep breathing, or yoga. General instructions      Keep a journal to find out what may trigger your migraine headaches. For example, write down: ? What you eat and drink. ? How much sleep you get. ? Any change to your diet or medicines.  If you have a migraine headache: ? Avoid things that make your symptoms worse, such as bright lights. ? It may help to lie down in a dark, quiet room. ? Do not drive or use heavy machinery. ? Ask your health care provider what activities are safe for you while you are experiencing symptoms.  Keep all follow-up visits as told by your health care provider. This is important. Contact a health care provider if:  You develop symptoms that are different or more severe than your usual migraine headache symptoms.  You have more than 15 headache days in one month. Get help right away if:  Your migraine headache becomes severe.  Your migraine headache lasts longer than 72 hours.  You have a fever.  You have a stiff neck.  You have vision loss.  Your muscles feel weak or like you cannot control them.  You start to lose your balance often.  You have trouble walking.  You faint.  You have a seizure. Summary  A migraine headache is an intense, throbbing pain on one side or both sides of the head. Migraines may also cause other symptoms, such as nausea, vomiting, and sensitivity to light and noise.  This condition may be  treated with medicines and lifestyle changes. You may also need to avoid certain things that trigger a migraine headache.  Keep a journal to find out what may trigger your migraine headaches.  Contact your health care provider if you have more than 15 headache days in a month or you develop symptoms that are different or more severe than your usual migraine headache symptoms. This information is not intended to replace advice given to you by your health care provider. Make sure you discuss any questions you have with your health care provider. Document Revised: 01/15/2019 Document Reviewed: 11/05/2018 Elsevier Patient Education  2020 [employer].

## 2020-02-22 LAB — TSH+FREE T4
Free T4: 1.03 ng/dL (ref 0.82–1.77)
TSH: 1.46 u[IU]/mL (ref 0.450–4.500)

## 2020-02-22 LAB — CBC WITH DIFFERENTIAL
Basophils Absolute: 0.1 10*3/uL (ref 0.0–0.2)
Basos: 1 %
EOS (ABSOLUTE): 0.2 10*3/uL (ref 0.0–0.4)
Eos: 2 %
Hematocrit: 40.9 % (ref 34.0–46.6)
Hemoglobin: 13.4 g/dL (ref 11.1–15.9)
Immature Grans (Abs): 0 10*3/uL (ref 0.0–0.1)
Immature Granulocytes: 0 %
Lymphocytes Absolute: 3.4 10*3/uL — ABNORMAL HIGH (ref 0.7–3.1)
Lymphs: 36 %
MCH: 26.3 pg — ABNORMAL LOW (ref 26.6–33.0)
MCHC: 32.8 g/dL (ref 31.5–35.7)
MCV: 80 fL (ref 79–97)
Monocytes Absolute: 0.7 10*3/uL (ref 0.1–0.9)
Monocytes: 7 %
Neutrophils Absolute: 5.2 10*3/uL (ref 1.4–7.0)
Neutrophils: 54 %
RBC: 5.1 x10E6/uL (ref 3.77–5.28)
RDW: 13.8 % (ref 11.7–15.4)
WBC: 9.6 10*3/uL (ref 3.4–10.8)

## 2020-02-24 LAB — CYTOLOGY - PAP
Comment: NEGATIVE
Diagnosis: NEGATIVE
Diagnosis: REACTIVE
High risk HPV: NEGATIVE

## 2020-03-30 ENCOUNTER — Other Ambulatory Visit: Payer: Self-pay

## 2020-03-30 MED ORDER — LAMOTRIGINE 100 MG PO TABS: 100.0000 mg | ORAL_TABLET | Freq: Two times a day (BID) | ORAL | 2 refills | Status: DC

## 2020-04-13 ENCOUNTER — Ambulatory Visit: Payer: Managed Care, Other (non HMO) | Admitting: Neurology

## 2020-04-13 IMAGING — MG DIGITAL DIAGNOSTIC BILATERAL MAMMOGRAM WITH TOMO AND CAD
8 series · 8 of 24 positions shown · non-contrast
Comparison: None.

CLINICAL DATA: 32-year-old female with diffuse right breast pain
for 1 month. Patient's physician also notes a red patch the skin
upper.

EXAM:
DIGITAL DIAGNOSTIC BILATERAL MAMMOGRAM WITH CAD AND TOMO

[L MLO synth-2D]
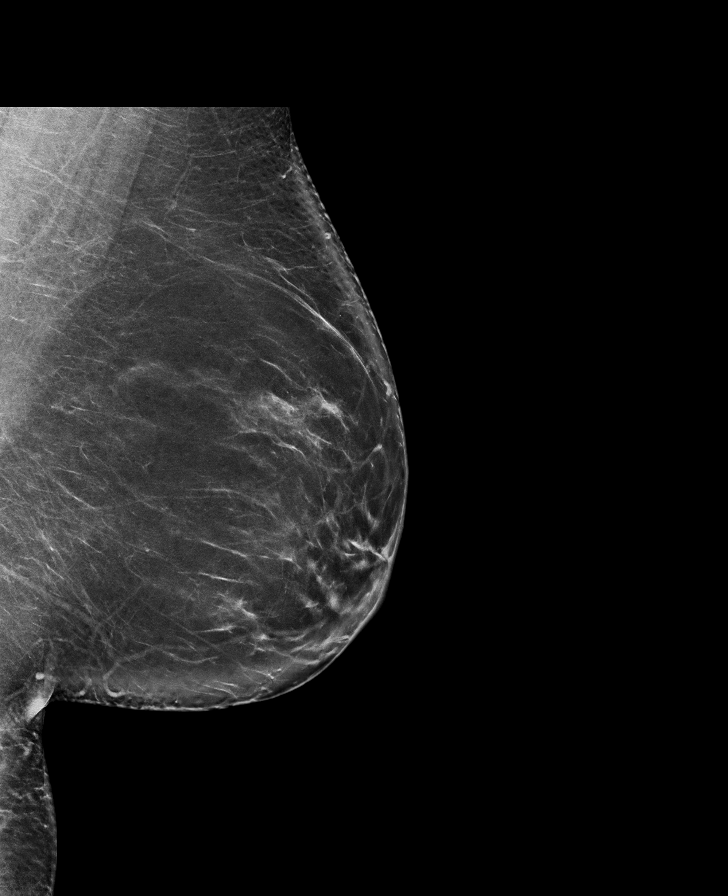

[R CC synth-2D]
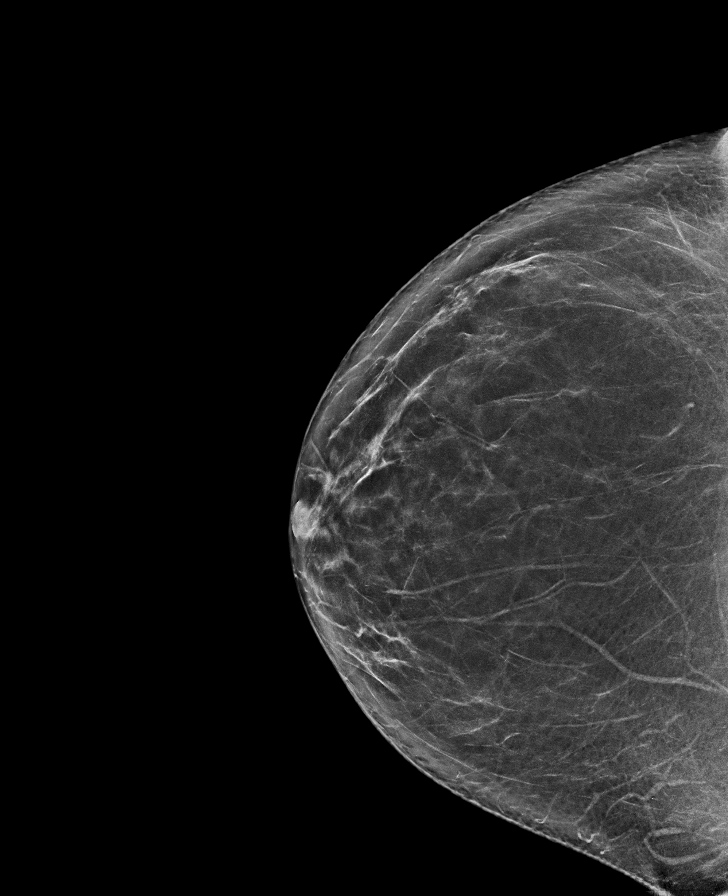

[R MLO synth-2D]
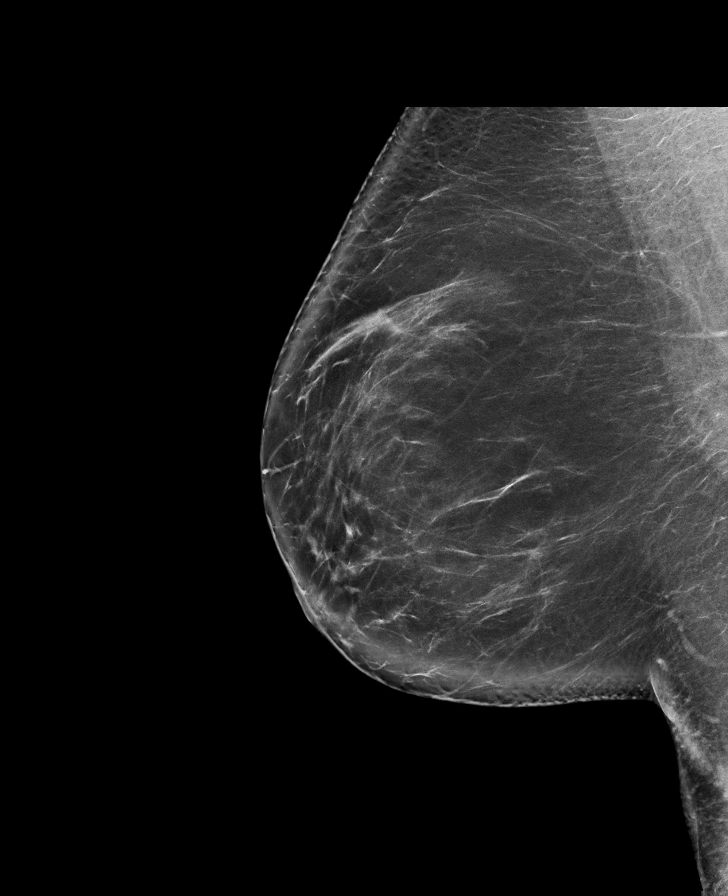

[L CC synth-2D]
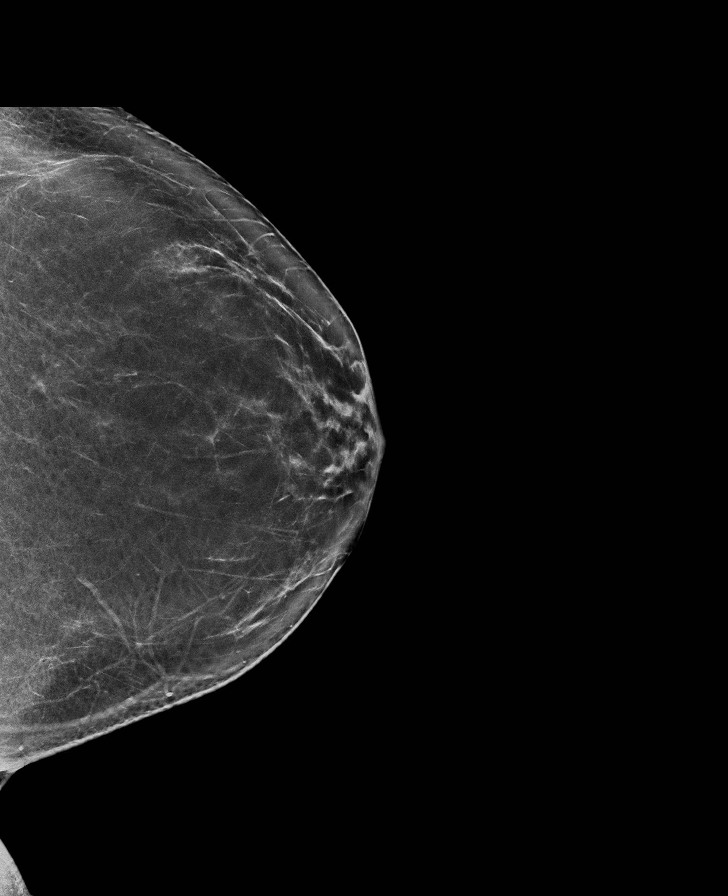

[R CC tomo · tomo slice 43/85.0]
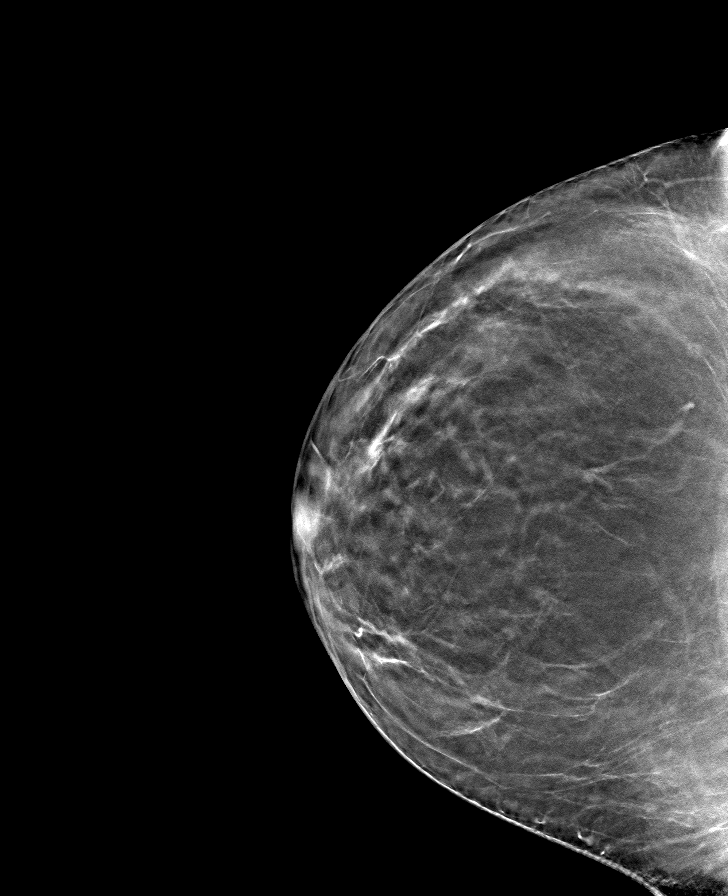

[L MLO tomo · tomo slice 49/96.0]
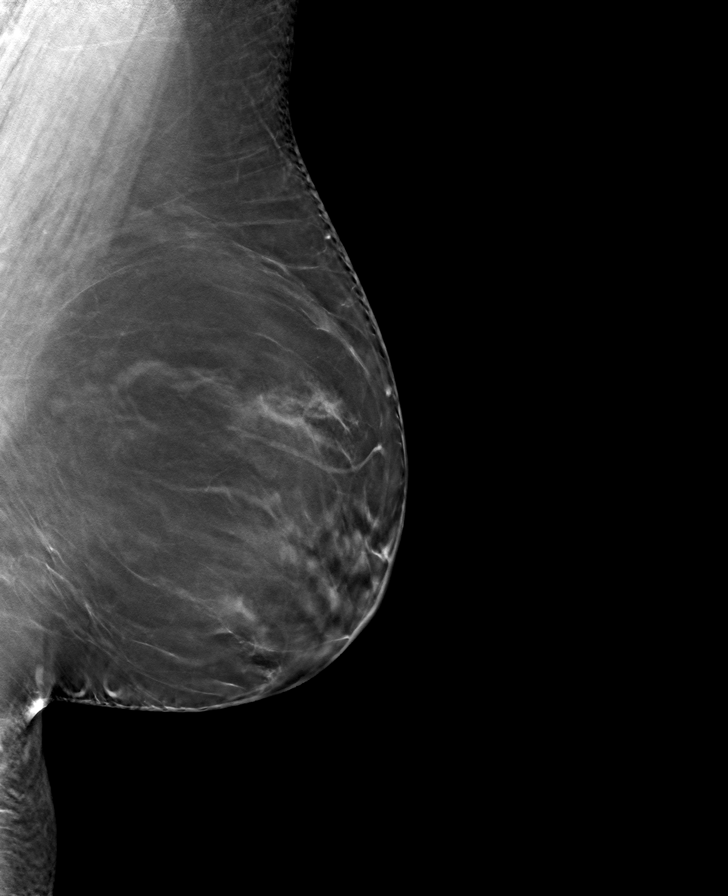

[R MLO tomo · tomo slice 53/104.0]
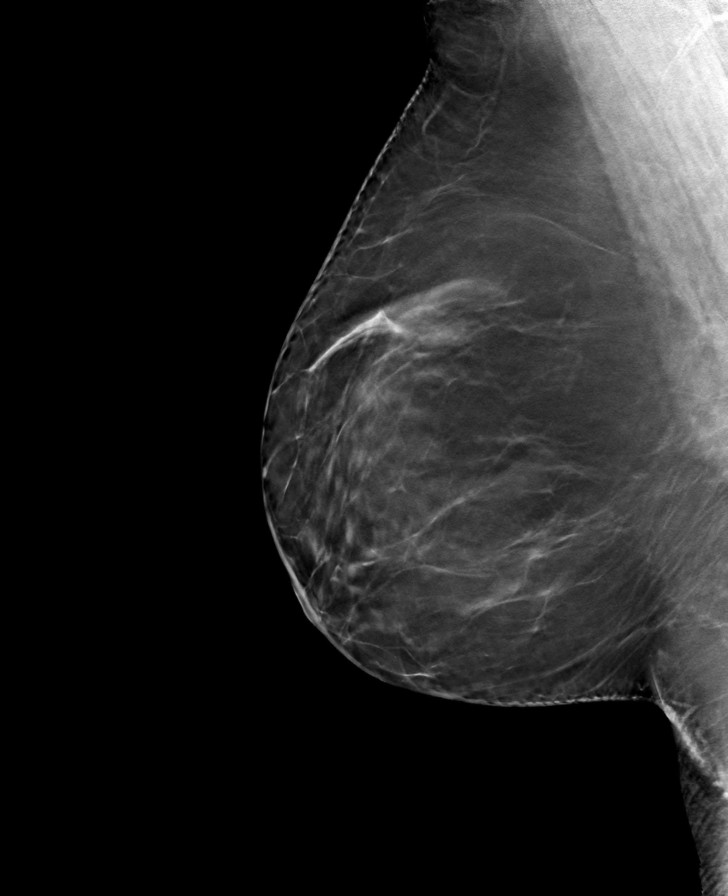

[L CC tomo · tomo slice 41/81.0]
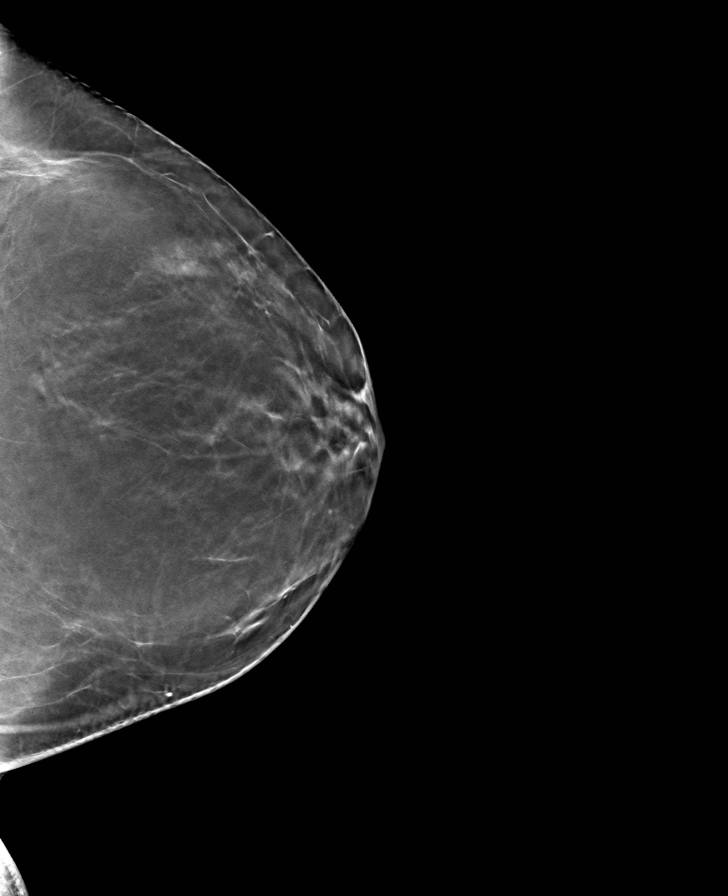

[8 of 24 positions shown; findings below may reference images not displayed]

ACR Breast Density Category b: There are scattered areas of
fibroglandular density.
FINDINGS: No suspicious mammographic findings are identified in either breast.

Mammographic images were processed with CAD.
IMPRESSION: No mammographic evidence of malignancy.

RECOMMENDATION:
1. Clinical follow-up recommended for the symptomatic area of
concern in the right breast. Any further workup should be based on
clinical grounds.
2. Screening mammogram at age 39 unless there are persistent or
intervening clinical concerns. (Code:[id number])

I have discussed the findings and recommendations with the patient.
Results were also provided in writing at the conclusion of the
visit. If applicable, a reminder letter will be sent to the patient
regarding the next appointment.

BI-RADS CATEGORY  1: Negative.

## 2020-04-20 ENCOUNTER — Other Ambulatory Visit: Payer: Self-pay

## 2020-04-20 ENCOUNTER — Ambulatory Visit (INDEPENDENT_AMBULATORY_CARE_PROVIDER_SITE_OTHER): Payer: 59 | Admitting: Adult Health

## 2020-04-20 ENCOUNTER — Encounter: Payer: Self-pay | Admitting: Adult Health

## 2020-04-20 DIAGNOSIS — F331 Major depressive disorder, recurrent, moderate: Secondary | ICD-10-CM | POA: Diagnosis not present

## 2020-04-20 DIAGNOSIS — F422 Mixed obsessional thoughts and acts: Secondary | ICD-10-CM

## 2020-04-20 DIAGNOSIS — F319 Bipolar disorder, unspecified: Secondary | ICD-10-CM

## 2020-04-20 DIAGNOSIS — G4700 Insomnia, unspecified: Secondary | ICD-10-CM | POA: Diagnosis not present

## 2020-04-20 MED ORDER — CARIPRAZINE HCL 1.5 MG PO CAPS: 1.5000 mg | ORAL_CAPSULE | Freq: Every day | ORAL | 2 refills | Status: DC

## 2020-04-20 NOTE — Progress Notes (Signed)
Jill [name] [id number] May 15, 1986 34 y.o.  Subjective:   Patient ID:  Jill [name] is a 34 y.o. (DOB [birth year]) female.  Chief Complaint: No chief complaint on file.   HPI Jill [name] presents to the office today for follow-up of MDD, BPD1, insomnia, and OCD.  Describes mood today as "not so good". Pleasant. Flat. Tearful "a lot here lately". Mood symptoms - reports anxiety, depression, and irritability. Getting frustrated easily. Stopped Luvox 2 weeks ago after learning there was a shortage of the medication. Mood has declined since stopping the Luvox. Reports 3 weeks of depression - mostly staying in bed since last visit while taking Luvox. Increased obsessive thinking - worries about her son. Denies mania. Increased spending - "I spent my whole check and my husband had to pay the bills". Denies substance use. Denies reckless behaviors. Works full time at [employer]. Mother-in-law taking care of son. Stable interest and motivation. Hospitalized in 2016 for paranoia, substance use, reckless behaviors, and overspending and was started on Geodon but had to stop due to costs.  Energy levels low - "I don't have any". Active, does not have a regular exercise routine. Walking 15 to 20 minutes most days. Enjoys some usual interests and activities. Married. Lives with husband of 5 years and newborn son - 46 months old. Family local. Sister lives in [place]. Appetite adequate. Weight loss - 20 pounds - 281 pounds - eating healthier.   Sleeping difficulties - insomnia for a week. Averages 4 hours. Denies daytime napping.  Focus and concentration difficulties without luvox - "I can't focus on anything". Completing tasks. Managing some aspects of household. Struggles in work setting - "I just stare at the screen".  Denies SI or HI. Denies AH or VH.  Hospitalized in 2016 for paranoia, substance use, reckless behaviors, and overspending.  Previous medication trials: Lamictal, Zoloft,  Luvox, Geodon   GAD-7     Office Visit from 02/21/2020 in [facility]  Total GAD-7 Score 7    PHQ2-9     Office Visit from 02/21/2020 in [facility]  PHQ-2 Total Score 2  PHQ-9 Total Score 6       Review of Systems:  Review of Systems  Musculoskeletal: Negative for gait problem.  Neurological: Negative for tremors.  Psychiatric/Behavioral:       Please refer to HPI    Medications: I have reviewed the patient's current medications.  Current Outpatient Medications  Medication Sig Dispense Refill  . cariprazine (VRAYLAR) capsule Take 1 capsule (1.5 mg total) by mouth daily. 30 capsule 2  . Fluvoxamine Maleate 100 MG CP24 Take 1 capsule (100 mg total) by mouth daily. 90 capsule 1  . lamoTRIgine (LAMICTAL) 100 MG tablet Take 1 tablet (100 mg total) by mouth 2 (two) times daily. 60 tablet 2   No current facility-administered medications for this visit.    Medication Side Effects: None  Allergies:  Allergies  Allergen Reactions  . Amoxicillin Anaphylaxis, Shortness Of Breath and Rash    Childhood reaction  Has patient had a PCN reaction causing immediate rash, facial/tongue/throat swelling, SOB or lightheadedness with hypotension: Yes Has patient had a PCN reaction causing severe rash involving mucus membranes or skin necrosis: Unknown Has patient had a PCN reaction that required hospitalization: No Has patient had a PCN reaction occurring within the last 10 years: No If all of the above answers are "NO", then may proceed with Cephalosporin use.  Marland Kitchen Penicillins     Pt does  not think that she is allergic to this medication but is not sure     Past Medical History:  Diagnosis Date  . Bipolar 1 disorder (HCC)   . Depression   . Migraine   . OCD (obsessive compulsive disorder)   . TMJ (dislocation of temporomandibular joint)     Family History  Problem Relation Age of Onset  . Depression Mother   . Alcohol abuse Father   . Lung cancer Maternal  Grandmother   . Hypertension Maternal Grandmother   . Hypertension Maternal Grandfather   . Stroke Maternal Grandfather   . Hypertension Paternal Grandmother   . Hypertension Paternal Grandfather   . Breast cancer Neg Hx     Social History   Socioeconomic History  . Marital status: Married    Spouse name: [name]  . Number of children: Not on file  . Years of education: Not on file  . Highest education level: Not on file  Occupational History  . Occupation: Lab assisstant  Tobacco Use  . Smoking status: Never Smoker  . Smokeless tobacco: Never Used  Vaping Use  . Vaping Use: Never used  Substance and Sexual Activity  . Alcohol use: No    Alcohol/week: 0.0 standard drinks  . Drug use: No  . Sexual activity: Yes    Partners: Male    Birth control/protection: None  Other Topics Concern  . Not on file  Social History Narrative   She is originally from [place] and graduated high school in [place]. She has been married for approximately 2years and has no children. She has been working for [employer] for the past one month. She currently lives with her husband. She denies any physical or sexual abuse.   Social Determinants of Health   Financial Resource Strain:   . Difficulty of Paying Living Expenses:   Food Insecurity:   . Worried About Programme researcher, broadcasting/film/video in the Last Year:   . Barista in the Last Year:   Transportation Needs:   . Freight forwarder (Medical):   Marland Kitchen Lack of Transportation (Non-Medical):   Physical Activity:   . Days of Exercise per Week:   . Minutes of Exercise per Session:   Stress:   . Feeling of Stress :   Social Connections:   . Frequency of Communication with Friends and Family:   . Frequency of Social Gatherings with Friends and Family:   . Attends Religious Services:   . Active Member of Clubs or Organizations:   . Attends Banker Meetings:   Marland Kitchen Marital Status:   Intimate Partner Violence:   . Fear of Current or  Ex-Partner:   . Emotionally Abused:   Marland Kitchen Physically Abused:   . Sexually Abused:     Past Medical History, Surgical history, Social history, and Family history were reviewed and updated as appropriate.   Please see review of systems for further details on the patient's review from today.   Objective:   Physical Exam:  There were no vitals taken for this visit.  Physical Exam Constitutional:      General: She is not in acute distress. Musculoskeletal:        General: No deformity.  Neurological:     Mental Status: She is alert and oriented to person, place, and time.     Coordination: Coordination normal.  Psychiatric:        Attention and Perception: Attention and perception normal. She does not perceive auditory or visual hallucinations.  Mood and Affect: Mood is anxious and depressed. Affect is not labile, blunt, angry or inappropriate.        Speech: Speech normal.        Behavior: Behavior normal.        Thought Content: Thought content normal. Thought content is not paranoid or delusional. Thought content does not include homicidal or suicidal ideation. Thought content does not include homicidal or suicidal plan.        Cognition and Memory: Cognition and memory normal.        Judgment: Judgment normal.     Comments: Insight intact     Lab Review:     Component Value Date/Time   NA 139 08/19/2019 1922   NA 139 01/31/2015 1329   K 3.2 (L) 08/19/2019 1922   K 3.7 01/31/2015 1329   CL 106 08/19/2019 1922   CL 107 01/31/2015 1329   CO2 21 (L) 08/19/2019 1922   CO2 26 01/31/2015 1329   GLUCOSE 85 08/19/2019 1922   GLUCOSE 86 01/31/2015 1329   BUN <5 (L) 08/19/2019 1922   BUN 9 01/31/2015 1329   CREATININE 0.63 08/21/2019 2116   CREATININE 0.63 01/31/2015 1329   CALCIUM 8.9 08/19/2019 1922   CALCIUM 8.8 (L) 01/31/2015 1329   PROT 6.3 (L) 08/19/2019 1922   PROT 7.6 01/31/2015 1329   ALBUMIN 2.4 (L) 08/19/2019 1922   ALBUMIN 3.8 01/31/2015 1329   AST 18  08/19/2019 1922   AST 24 01/31/2015 1329   ALT 16 08/19/2019 1922   ALT 19 01/31/2015 1329   ALKPHOS 119 08/19/2019 1922   ALKPHOS 73 01/31/2015 1329   BILITOT 0.4 08/19/2019 1922   BILITOT 0.7 01/31/2015 1329   GFRNONAA >60 08/21/2019 2116   GFRNONAA >60 01/31/2015 1329   GFRAA >60 08/21/2019 2116   GFRAA >60 01/31/2015 1329       Component Value Date/Time   WBC 9.6 02/21/2020 1119   WBC 17.0 (H) 08/22/2019 0741   RBC 5.10 02/21/2020 1119   RBC 3.56 (L) 08/22/2019 0741   HGB 13.4 02/21/2020 1119   HCT 40.9 02/21/2020 1119   PLT 319 08/22/2019 0741   PLT 314 06/02/2019 0932   MCV 80 02/21/2020 1119   MCV 85 01/31/2015 1329   MCH 26.3 (L) 02/21/2020 1119   MCH 28.4 08/22/2019 0741   MCHC 32.8 02/21/2020 1119   MCHC 32.9 08/22/2019 0741   RDW 13.8 02/21/2020 1119   RDW 13.4 01/31/2015 1329   LYMPHSABS 3.4 (H) 02/21/2020 1119   MONOABS 0.7 02/24/2018 0405   EOSABS 0.2 02/21/2020 1119   BASOSABS 0.1 02/21/2020 1119    No results found for: POCLITH, LITHIUM   No results found for: PHENYTOIN, PHENOBARB, VALPROATE, CBMZ   .res Assessment: Plan:    Plan:  PDMP reviewed  Lamictal 100mg  at hs D/C Luvox 100mg  CR daily - stopped 2 weeks ago because of a Psychiatristmanufacturer shortage. Add Vraylar 1.5mg  daily - 2 weeks of samples given and script sent.  RTC 4 weeks  Patient advised to contact office with any questions, adverse effects, or acute worsening in signs and symptoms.  Counseled patient regarding potential benefits, risks, and side effects of Lamictal to include potential risk of Stevens-Johnson syndrome. Advised patient to stop taking Lamictal and contact office immediately if rash develops and to seek urgent medical attention if rash is severe and/or spreading quickly.    Diagnoses and all orders for this visit:  Bipolar I disorder (HCC) -  cariprazine (VRAYLAR) capsule; Take 1 capsule (1.5 mg total) by mouth daily.  Major depressive disorder, recurrent  episode, moderate (HCC)  Insomnia, unspecified type  Mixed obsessional thoughts and acts     Please see After Visit Summary for patient specific instructions.  Future Appointments  Date Time Provider Department Center  04/27/2020  9:15 AM Anson Fret, MD [facility] None  05/18/2020  4:40 PM Surie Suchocki, Thereasa Solo, NP [facility] None    No orders of the defined types were placed in this encounter.   -------------------------------

## 2020-04-27 ENCOUNTER — Ambulatory Visit: Payer: Managed Care, Other (non HMO) | Admitting: Neurology

## 2020-04-27 ENCOUNTER — Encounter: Payer: Self-pay | Admitting: Neurology

## 2020-04-27 ENCOUNTER — Other Ambulatory Visit: Payer: Self-pay

## 2020-04-27 VITALS — BP 152/98 | HR 100 | Ht 69.0 in | Wt 284.0 lb

## 2020-04-27 DIAGNOSIS — G43109 Migraine with aura, not intractable, without status migrainosus: Secondary | ICD-10-CM

## 2020-04-27 DIAGNOSIS — H5460 Unqualified visual loss, one eye, unspecified: Secondary | ICD-10-CM | POA: Diagnosis not present

## 2020-04-27 DIAGNOSIS — R51 Headache with orthostatic component, not elsewhere classified: Secondary | ICD-10-CM | POA: Diagnosis not present

## 2020-04-27 DIAGNOSIS — H53453 Other localized visual field defect, bilateral: Secondary | ICD-10-CM

## 2020-04-27 DIAGNOSIS — R519 Headache, unspecified: Secondary | ICD-10-CM

## 2020-04-27 MED ORDER — AJOVY 225 MG/1.5ML ~~LOC~~ SOAJ: 225.0000 mg | SUBCUTANEOUS | 11 refills | Status: DC

## 2020-04-27 MED ORDER — FREMANEZUMAB-VFRM 225 MG/1.5ML ~~LOC~~ SOSY: 225.0000 mg | PREFILLED_SYRINGE | Freq: Once | SUBCUTANEOUS | Status: DC

## 2020-04-27 MED ORDER — RIZATRIPTAN BENZOATE 10 MG PO TBDP: 10.0000 mg | ORAL_TABLET | ORAL | 11 refills | Status: DC | PRN

## 2020-04-27 MED ORDER — ONDANSETRON 4 MG PO TBDP: 4.0000 mg | ORAL_TABLET | Freq: Three times a day (TID) | ORAL | 3 refills | Status: DC | PRN

## 2020-04-27 NOTE — Progress Notes (Signed)
[facility]    Provider:  Dr Jill [name] Requesting Provider: Adelene Idler MD Primary Care Provider:  Estell Harpin, MD  CC:  headaches  HPI:  Jill [name] is a 34 y.o. female here as requested by Jill Idler MD for migraines. PMHx bipolar. She developed migraines in her teens and diagnosed in her 82s, paternal grandmother may have had migraines. Her migraine come on with spots in both eyes mainly the right, she has vision loss in the right eye or like she is looking through cellophane, nothing she says makes sense, she can have this even without the head pain but she can have a headache about 30 minutes afterwards like her "head is going to explode" its horrible, usually on the right, pulsating and pounding and throbbing radiates to the neck, centered behind the right eye, light and sound sensitivity, nausea na vomiting, movement makes it worse, excedrin may help a little she lays in her room in the dark, feeling hot can trigger a migraine, stress, weather, period can make it worse. She has migraines 2-3x a month and can last 2 days. She has daily headaches, 5 migraine days a month. The other headaches are dull around the temples and forehead. She states these are getting worse and the loss of vision in the right eye is new, she can wake with the headaches or she will wake up with headache and vomiting, it is worse positionally when she stands it is worse or if she moves at all it hurts she can't even roll over. No other focal neurologic deficits, associated symptoms, inciting events or modifiable factors.  Reviewed notes, labs and imaging from outside physicians, which showed:  TSH normal, cbc unremarkable  meds tried: magnesium, reglan injection, tylenol, fioricet, decadron injection, benadry po,ibuprofen, lamictal, zofran, phenergan, trazodone  MRI 2014: showed No acute intracranial abnormalities including mass lesion or mass effect, hydrocephalus, extra-axial  fluid collection, midline shift, hemorrhage, or acute infarction, large ischemic events (personally reviewed images)     Review of Systems: Patient complains of symptoms per HPI as well as the following symptoms headaches. Pertinent negatives and positives per HPI. All others negative.   Social History   Socioeconomic History  . Marital status: Married    Spouse name: Jill [name]  . Number of children: Not on file  . Years of education: Not on file  . Highest education level: Not on file  Occupational History  . Occupation: Lab assisstant  Tobacco Use  . Smoking status: Never Smoker  . Smokeless tobacco: Never Used  Vaping Use  . Vaping Use: Never used  Substance and Sexual Activity  . Alcohol use: No    Alcohol/week: 0.0 standard drinks  . Drug use: No  . Sexual activity: Yes    Partners: Male    Birth control/protection: None  Other Topics Concern  . Not on file  Social History Narrative   She is originally from [place] and graduated high school in [place]. She has been married for approximately 2years and has no children. She has been working for [employer] for the past one month. She currently lives with her husband. She denies any physical or sexual abuse.   Social Determinants of Health   Financial Resource Strain:   . Difficulty of Paying Living Expenses:   Food Insecurity:   . Worried About Programme researcher, broadcasting/film/video in the Last Year:   . Barista in the Last Year:   Transportation Needs:   . Lack of Transportation (  Medical):   Marland Kitchen. Lack of Transportation (Non-Medical):   Physical Activity:   . Days of Exercise per Week:   . Minutes of Exercise per Session:   Stress:   . Feeling of Stress :   Social Connections:   . Frequency of Communication with Friends and Family:   . Frequency of Social Gatherings with Friends and Family:   . Attends Religious Services:   . Active Member of Clubs or Organizations:   . Attends BankerClub or Organization Meetings:   Marland Kitchen. Marital  Status:   Intimate Partner Violence:   . Fear of Current or Ex-Partner:   . Emotionally Abused:   Marland Kitchen. Physically Abused:   . Sexually Abused:     Family History  Problem Relation Age of Onset  . Depression Mother   . Alcohol abuse Father   . Lung cancer Maternal Grandmother   . Hypertension Maternal Grandmother   . Hypertension Maternal Grandfather   . Stroke Maternal Grandfather   . Hypertension Paternal Grandmother   . Headache Paternal Grandmother   . Hypertension Paternal Grandfather   . Breast cancer Neg Hx     Past Medical History:  Diagnosis Date  . Bipolar 1 disorder (HCC)   . Depression   . Migraine   . OCD (obsessive compulsive disorder)   . TMJ (dislocation of temporomandibular joint)     Patient Active Problem List   Diagnosis Date Noted  . Migraine with aura and without status migrainosus, not intractable 04/27/2020  . Status post cesarean delivery 08/21/2019  . Bilateral lower extremity edema 08/19/2019  . Severe preeclampsia 08/19/2019  . Incomplete abortion 03/03/2018  . Bipolar 1 disorder (HCC) 02/23/2018  . Supervision of high risk pregnancy, antepartum 01/13/2018  . BMI 40.0-44.9, adult (HCC) 01/13/2018  . Endometriosis determined by laparoscopy 03/31/2017  . Pelvic pain in female 02/20/2017  . Dyspareunia in female 02/01/2017  . Menorrhagia with regular cycle 02/01/2017  . Dysmenorrhea 02/01/2017  . Major depressive disorder, recurrent, severe without psychotic features (HCC)   . OCD (obsessive compulsive disorder) 02/05/2015  . 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 Bipolar 2 disorder, major depressive episode (HCC) 02/04/2015    Past Surgical History:  Procedure Laterality Date  . CESAREAN SECTION N/A 08/21/2019   Procedure: CESAREAN SECTION;  Surgeon: Jill NovakJackson, Jill D, MD;  Location: [facility] ORS;  Service: Obstetrics;  Laterality: N/A;  . DILATION AND EVACUATION N/A 03/03/2018   Procedure: DILATATION AND EVACUATION;  Surgeon: Nadara MustardHarris,  Jill P, MD;  Location: [facility] OR[facility];  Service: Gynecology;  Laterality: N/A;  . LAPAROSCOPIC LYSIS OF ADHESIONS  03/21/2017   Procedure: LAPAROSCOPIC LYSIS OF ADHESIONS;  Surgeon: Jill NovakJackson, Jill D, MD;  Location: [facility] OR[facility];  Service: Gynecology;;  . LAPAROSCOPY N/A 03/21/2017   Procedure: LAPAROSCOPY DIAGNOSTIC/FULGERATION OF ENDOMETRIAL IMPLANTS;  Surgeon: Jill NovakJackson, Jill D, MD;  Location: [facility] OR[facility];  Service: Gynecology;  Laterality: N/A;  . None      Current Outpatient Medications  Medication Sig Dispense Refill  . cariprazine (VRAYLAR) capsule Take 1 capsule (1.5 mg total) by mouth daily. 30 capsule 2  . doxycycline (VIBRA-TABS) 100 MG tablet Take by mouth.    . Fluvoxamine Maleate 100 MG CP24 Take 1 capsule (100 mg total) by mouth daily. 90 capsule 1  . lamoTRIgine (LAMICTAL) 100 MG tablet Take 1 tablet (100 mg total) by mouth 2 (two) times daily. 60 tablet 2  . Fremanezumab-vfrm (AJOVY) 225 MG/1.5ML SOAJ Inject 225 mg into the skin every 30 (thirty) days. 1 pen  11  . ondansetron (ZOFRAN-ODT) 4 MG disintegrating tablet Take 1 tablet (4 mg total) by mouth every 8 (eight) hours as needed for nausea. 30 tablet 3  . rizatriptan (MAXALT-MLT) 10 MG disintegrating tablet Take 1 tablet (10 mg total) by mouth as needed for migraine. May repeat in 2 hours if needed 9 tablet 11   Current Facility-Administered Medications  Medication Dose Route Frequency Provider Last Rate Last Admin  . Fremanezumab-vfrm SOSY 225 mg  225 mg Subcutaneous Once Anson Fret, MD        Allergies as of 04/27/2020 - Review Complete 04/27/2020  Allergen Reaction Noted  . Amoxicillin Anaphylaxis, Shortness Of Breath, and Rash 08/09/2013  . Penicillins  08/09/2013    Vitals: BP (!) 152/98   Pulse 100   Ht 5\' 9"  (1.753 m)   Wt 284 lb (128.8 kg)   BMI 41.94 kg/m  Last Weight:  Wt Readings from Last 1 Encounters:  04/27/20 284 lb (128.8 kg)   Last Height:   Ht Readings from Last 1 Encounters:  04/27/20 5\' 9"   (1.753 m)     Physical exam: Exam: Gen: NAD, conversant, well nourised, obese, well groomed                     CV: RRR, no MRG. No Carotid Bruits. No peripheral edema, warm, nontender Eyes: Conjunctivae clear without exudates or hemorrhage  Neuro: Detailed Neurologic Exam  Speech:    Speech is normal; fluent and spontaneous with normal comprehension.  Cognition:    The patient is oriented to person, place, and time;     recent and remote memory intact;     language fluent;     normal attention, concentration,     fund of knowledge Cranial Nerves:    The pupils are equal, round, and reactive to light. The fundi are flat. Visual fields are impaired left eye lower left quadrant and left eye upper right quadrant and in the right eye in the right upper quadrant.    Extraocular movements are intact. Trigeminal sensation is intact and the muscles of mastication are normal. The face is symmetric. The palate elevates in the midline. Hearing intact. Voice is normal. Shoulder shrug is normal. The tongue has normal motion without fasciculations.   Coordination:    No dysmetria or ataxia  Gait:    Heel-toe gait are normal. Normal native gait.  Motor Observation:    No asymmetry, no atrophy, and no involuntary movements noted. Tone:    Normal muscle tone.    Posture:    Posture is normal. normal erect    Strength:    Strength is V/V in the upper and lower limbs.      Sensation: intact to LT     Reflex Exam:  DTR's:    Deep tendon reflexes in the upper and lower extremities are normal bilaterally.   Toes:    The toes are downgoing bilaterally.   Clonus:    Clonus is absent.    Assessment/Plan:  34 y.o. female here as requested by MD for migraines. PMHx bipolar. She developed migraines in her teens and diagnosed in her 67s, paternal grandmother may have had migraines. She has migraines with aura but has some concerning symptoms of right eye vision loss and  impaired peripheral vision on exam we need an MRI of the brain w/wo contrast as well as visual field testing and ophthalmology.   Acute management: Maxalt(Rizatriptan) and Zofran(Ondansetron): Please take one tablet  at the onset of your headache. If it does not improve the symptoms please take one additional tablet. Do not take more then 2 tablets in 24hrs. Do not take use more then 2 to 3 times in a week.  Preventative: Ajovy  MRI of the brain : MRI brain due to concerning symptoms of morning headaches, positional headaches,vision loss to look for space occupying mass, chiari or intracranial hypertension (pseudotumor).  Ophthalmology appointment  Discussed: To prevent or relieve headaches, try the following: Cool Compress. Lie down and place a cool compress on your head.  Avoid headache triggers. If certain foods or odors seem to have triggered your migraines in the past, avoid them. A headache diary might help you identify triggers.  Include physical activity in your daily routine. Try a daily walk or other moderate aerobic exercise.  Manage stress. Find healthy ways to cope with the stressors, such as delegating tasks on your to-do list.  Practice relaxation techniques. Try deep breathing, yoga, massage and visualization.  Eat regularly. Eating regularly scheduled meals and maintaining a healthy diet might help prevent headaches. Also, drink plenty of fluids.  Follow a regular sleep schedule. Sleep deprivation might contribute to headaches Consider biofeedback. With this mind-body technique, you learn to control certain bodily functions -- such as muscle tension, heart rate and blood pressure -- to prevent headaches or reduce headache pain.    Proceed to emergency room if you experience new or worsening symptoms or symptoms do not resolve, if you have new neurologic symptoms or if headache is severe, or for any concerning symptom.   Provided education and documentation from American  headache Society toolbox including articles on: chronic migraine medication overuse headache, chronic migraines, prevention of migraines, behavioral and other nonpharmacologic treatments for headache.  Discussed: There is increased risk for stroke in women with migraine with aura and a contraindication for the combined contraceptive pill for use by women who have migraine with aura. The risk for women with migraine without aura is lower. However other risk factors like smoking are far more likely to increase stroke risk than migraine. There is a recommendation for no smoking and for the use of OCPs without estrogen such as progestogen only pills particularly for women with migraine with aura.Marland Kitchen People who have migraine headaches with auras may be 3 times more likely to have a stroke caused by a blood clot, compared to migraine patients who don't see auras. Women who take hormone-replacement therapy may be 30 percent more likely to suffer a clot-based stroke than women not taking medication containing estrogen. Other risk factors like smoking and high blood pressure may be  much more important.    Orders Placed This Encounter  Procedures  . MR BRAIN W WO CONTRAST  . Comprehensive metabolic panel  . Ambulatory referral to Ophthalmology   Meds ordered this encounter  Medications  . rizatriptan (MAXALT-MLT) 10 MG disintegrating tablet    Sig: Take 1 tablet (10 mg total) by mouth as needed for migraine. May repeat in 2 hours if needed    Dispense:  9 tablet    Refill:  11  . ondansetron (ZOFRAN-ODT) 4 MG disintegrating tablet    Sig: Take 1 tablet (4 mg total) by mouth every 8 (eight) hours as needed for nausea.    Dispense:  30 tablet    Refill:  3  . Fremanezumab-vfrm SOSY 225 mg  . Fremanezumab-vfrm (AJOVY) 225 MG/1.5ML SOAJ    Sig: Inject 225 mg into the skin every 30 (  thirty) days.    Dispense:  1 pen    Refill:  11    Cc: Jill Harpin, MD,  Jill Idler MD  Naomie Dean,  MD  [facility] [address] [place], [place] [address]  Phone [phone] Fax [phone]

## 2020-04-27 NOTE — Patient Instructions (Addendum)
Acute management: Maxalt(Rizatriptan) and Zofran(Ondansetron): Please take one tablet at the onset of your headache. If it does not improve the symptoms please take one additional tablet. Do not take more then 2 tablets in 24hrs. Do not take use more then 2 to 3 times in a week.  Preventative: Ajovy  MRI of the brain Ophthalmology appointment  There is increased risk for stroke in women with migraine with aura and a contraindication for the combined contraceptive pill for use by women who have migraine with aura. The risk for women with migraine without aura is lower. However other risk factors like smoking are far more likely to increase stroke risk than migraine. There is a recommendation for no smoking and for the use of OCPs without estrogen such as progestogen only pills particularly for women with migraine with aura.Marland Kitchen People who have migraine headaches with auras may be 3 times more likely to have a stroke caused by a blood clot, compared to migraine patients who don't see auras. Women who take hormone-replacement therapy may be 30 percent more likely to suffer a clot-based stroke than women not taking medication containing estrogen. Other risk factors like smoking and high blood pressure may be  much more important. Migraine Headache A migraine headache is an intense, throbbing pain on one side or both sides of the head. Migraine headaches may also cause other symptoms, such as nausea, vomiting, and sensitivity to light and noise. A migraine headache can last from 4 hours to 3 days. Talk with your doctor about what things may bring on (trigger) your migraine headaches. What are the causes? The exact cause of this condition is not known. However, a migraine may be caused when nerves in the brain become irritated and release chemicals that cause inflammation of blood vessels. This inflammation causes pain. This condition may be triggered or caused by:  Drinking alcohol.  Smoking.  Taking  medicines, such as: ? Medicine used to treat chest pain (nitroglycerin). ? Birth control pills. ? Estrogen. ? Certain blood pressure medicines.  Eating or drinking products that contain nitrates, glutamate, aspartame, or tyramine. Aged cheeses, chocolate, or caffeine may also be triggers.  Doing physical activity. Other things that may trigger a migraine headache include:  Menstruation.  Pregnancy.  Hunger.  Stress.  Lack of sleep or too much sleep.  Weather changes.  Fatigue. What increases the risk? The following factors may make you more likely to experience migraine headaches:  Being a certain age. This condition is more common in people who are 54-34 years old.  Being female.  Having a family history of migraine headaches.  Being Caucasian.  Having a mental health condition, such as depression or anxiety.  Being obese. What are the signs or symptoms? The main symptom of this condition is pulsating or throbbing pain. This pain may:  Happen in any area of the head, such as on one side or both sides.  Interfere with daily activities.  Get worse with physical activity.  Get worse with exposure to bright lights or loud noises. Other symptoms may include:  Nausea.  Vomiting.  Dizziness.  General sensitivity to bright lights, loud noises, or smells. Before you get a migraine headache, you may get warning signs (an aura). An aura may include:  Seeing flashing lights or having blind spots.  Seeing bright spots, halos, or zigzag lines.  Having tunnel vision or blurred vision.  Having numbness or a tingling feeling.  Having trouble talking.  Having muscle weakness. Some people have  symptoms after a migraine headache (postdromal phase), such as:  Feeling tired.  Difficulty concentrating. How is this diagnosed? A migraine headache can be diagnosed based on:  Your symptoms.  A physical exam.  Tests, such as: ? CT scan or an MRI of the head.  These imaging tests can help rule out other causes of headaches. ? Taking fluid from the spine (lumbar puncture) and analyzing it (cerebrospinal fluid analysis, or CSF analysis). How is this treated? This condition may be treated with medicines that:  Relieve pain.  Relieve nausea.  Prevent migraine headaches. Treatment for this condition may also include:  Acupuncture.  Lifestyle changes like avoiding foods that trigger migraine headaches.  Biofeedback.  Cognitive behavioral therapy. Follow these instructions at home: Medicines  Take over-the-counter and prescription medicines only as told by your health care provider.  Ask your health care provider if the medicine prescribed to you: ? Requires you to avoid driving or using heavy machinery. ? Can cause constipation. You may need to take these actions to prevent or treat constipation:  Drink enough fluid to keep your urine pale yellow.  Take over-the-counter or prescription medicines.  Eat foods that are high in fiber, such as beans, whole grains, and fresh fruits and vegetables.  Limit foods that are high in fat and processed sugars, such as fried or sweet foods. Lifestyle  Do not drink alcohol.  Do not use any products that contain nicotine or tobacco, such as cigarettes, e-cigarettes, and chewing tobacco. If you need help quitting, ask your health care provider.  Get at least 8 hours of sleep every night.  Find ways to manage stress, such as meditation, deep breathing, or yoga. General instructions      Keep a journal to find out what may trigger your migraine headaches. For example, write down: ? What you eat and drink. ? How much sleep you get. ? Any change to your diet or medicines.  If you have a migraine headache: ? Avoid things that make your symptoms worse, such as bright lights. ? It may help to lie down in a dark, quiet room. ? Do not drive or use heavy machinery. ? Ask your health care provider  what activities are safe for you while you are experiencing symptoms.  Keep all follow-up visits as told by your health care provider. This is important. Contact a health care provider if:  You develop symptoms that are different or more severe than your usual migraine headache symptoms.  You have more than 15 headache days in one month. Get help right away if:  Your migraine headache becomes severe.  Your migraine headache lasts longer than 72 hours.  You have a fever.  You have a stiff neck.  You have vision loss.  Your muscles feel weak or like you cannot control them.  You start to lose your balance often.  You have trouble walking.  You faint.  You have a seizure. Summary  A migraine headache is an intense, throbbing pain on one side or both sides of the head. Migraines may also cause other symptoms, such as nausea, vomiting, and sensitivity to light and noise.  This condition may be treated with medicines and lifestyle changes. You may also need to avoid certain things that trigger a migraine headache.  Keep a journal to find out what may trigger your migraine headaches.  Contact your health care provider if you have more than 15 headache days in a month or you develop symptoms that are  different or more severe than your usual migraine headache symptoms. This information is not intended to replace advice given to you by your health care provider. Make sure you discuss any questions you have with your health care provider. Document Revised: 01/15/2019 Document Reviewed: 11/05/2018 Elsevier Patient Education  2020 [employer] Ondansetron oral dissolving tablet What is this medicine? ONDANSETRON (on DAN se tron) is used to treat nausea and vomiting caused by chemotherapy. It is also used to prevent or treat nausea and vomiting after surgery. This medicine may be used for other purposes; ask your health care provider or pharmacist if you have questions. COMMON BRAND  NAME(S): Zofran ODT What should I tell my health care provider before I take this medicine? They need to know if you have any of these conditions:  heart disease  history of irregular heartbeat  liver disease  low levels of magnesium or potassium in the blood  an unusual or allergic reaction to ondansetron, granisetron, other medicines, foods, dyes, or preservatives  pregnant or trying to get pregnant  breast-feeding How should I use this medicine? These tablets are made to dissolve in the mouth. Do not try to push the tablet through the foil backing. With dry hands, peel away the foil backing and gently remove the tablet. Place the tablet in the mouth and allow it to dissolve, then swallow. While you may take these tablets with water, it is not necessary to do so. Talk to your pediatrician regarding the use of this medicine in children. Special care may be needed. Overdosage: If you think you have taken too much of this medicine contact a poison control center or emergency room at once. NOTE: This medicine is only for you. Do not share this medicine with others. What if I miss a dose? If you miss a dose, take it as soon as you can. If it is almost time for your next dose, take only that dose. Do not take double or extra doses. What may interact with this medicine? Do not take this medicine with any of the following medications:  apomorphine  certain medicines for fungal infections like fluconazole, itraconazole, ketoconazole, posaconazole, voriconazole  cisapride  dronedarone  pimozide  thioridazine This medicine may also interact with the following medications:  carbamazepine  certain medicines for depression, anxiety, or psychotic disturbances  fentanyl  linezolid  MAOIs like Carbex, Eldepryl, Marplan, Nardil, and Parnate  methylene blue (injected into a vein)  other medicines that prolong the QT interval (cause an abnormal heart rhythm) like dofetilide,  ziprasidone  phenytoin  rifampicin  tramadol This list may not describe all possible interactions. Give your health care provider a list of all the medicines, herbs, non-prescription drugs, or dietary supplements you use. Also tell them if you smoke, drink alcohol, or use illegal drugs. Some items may interact with your medicine. What should I watch for while using this medicine? Check with your doctor or health care professional as soon as you can if you have any sign of an allergic reaction. What side effects may I notice from receiving this medicine? Side effects that you should report to your doctor or health care professional as soon as possible:  allergic reactions like skin rash, itching or hives, swelling of the face, lips, or tongue  breathing problems  confusion  dizziness  fast or irregular heartbeat  feeling faint or lightheaded, falls  fever and chills  loss of balance or coordination  seizures  sweating  swelling of the hands and  feet  tightness in the chest  tremors  unusually weak or tired Side effects that usually do not require medical attention (report to your doctor or health care professional if they continue or are bothersome):  constipation or diarrhea  headache This list may not describe all possible side effects. Call your doctor for medical advice about side effects. You may report side effects to FDA at 1-800-FDA-1088. Where should I keep my medicine? Keep out of the reach of children. Store between 2 and 30 degrees C (36 and 86 degrees F). Throw away any unused medicine after the expiration date. NOTE: This sheet is a summary. It may not cover all possible information. If you have questions about this medicine, talk to your doctor, pharmacist, or health care provider.  2020 Elsevier/Gold Standard (2018-09-15 07:14:10) Rizatriptan disintegrating tablets What is this medicine? RIZATRIPTAN (rye za TRIP tan) is used to treat migraines with  or without aura. An aura is a strange feeling or visual disturbance that warns you of an attack. It is not used to prevent migraines. This medicine may be used for other purposes; ask your health care provider or pharmacist if you have questions. COMMON BRAND NAME(S): Maxalt-MLT What should I tell my health care provider before I take this medicine? They need to know if you have any of these conditions:  cigarette smoker  circulation problems in fingers and toes  diabetes  heart disease  high blood pressure  high cholesterol  history of irregular heartbeat  history of stroke  kidney disease  liver disease  stomach or intestine problems  an unusual or allergic reaction to rizatriptan, other medicines, foods, dyes, or preservatives  pregnant or trying to get pregnant  breast-feeding How should I use this medicine? Take this medicine by mouth. Follow the directions on the prescription label. Leave the tablet in the sealed blister pack until you are ready to take it. With dry hands, open the blister and gently remove the tablet. If the tablet breaks or crumbles, throw it away and take a new tablet out of the blister pack. Place the tablet in the mouth and allow it to dissolve, and then swallow. Do not cut, crush, or chew this medicine. You do not need water to take this medicine. Do not take it more often than directed. Talk to your pediatrician regarding the use of this medicine in children. While this drug may be prescribed for children as young as 6 years for selected conditions, precautions do apply. Overdosage: If you think you have taken too much of this medicine contact a poison control center or emergency room at once. NOTE: This medicine is only for you. Do not share this medicine with others. What if I miss a dose? This does not apply. This medicine is not for regular use. What may interact with this medicine? Do not take this medicine with any of the following  medicines:  certain medicines for migraine headache like almotriptan, eletriptan, frovatriptan, naratriptan, rizatriptan, sumatriptan, zolmitriptan  ergot alkaloids like dihydroergotamine, ergonovine, ergotamine, methylergonovine  MAOIs like Carbex, Eldepryl, Marplan, Nardil, and Parnate This medicine may also interact with the following medications:  certain medicines for depression, anxiety, or psychotic disorders  propranolol This list may not describe all possible interactions. Give your health care provider a list of all the medicines, herbs, non-prescription drugs, or dietary supplements you use. Also tell them if you smoke, drink alcohol, or use illegal drugs. Some items may interact with your medicine. What should I watch for  while using this medicine? Visit your healthcare professional for regular checks on your progress. Tell your healthcare professional if your symptoms do not start to get better or if they get worse. You may get drowsy or dizzy. Do not drive, use machinery, or do anything that needs mental alertness until you know how this medicine affects you. Do not stand up or sit up quickly, especially if you are an older patient. This reduces the risk of dizzy or fainting spells. Alcohol may interfere with the effect of this medicine. Your mouth may get dry. Chewing sugarless gum or sucking hard candy and drinking plenty of water may help. Contact your healthcare professional if the problem does not go away or is severe. If you take migraine medicines for 10 or more days a month, your migraines may get worse. Keep a diary of headache days and medicine use. Contact your healthcare professional if your migraine attacks occur more frequently. What side effects may I notice from receiving this medicine? Side effects that you should report to your doctor or health care professional as soon as possible:  allergic reactions like skin rash, itching or hives, swelling of the face, lips,  or tongue  chest pain or chest tightness  signs and symptoms of a dangerous change in heartbeat or heart rhythm like chest pain; dizziness; fast, irregular heartbeat; palpitations; feeling faint or lightheaded; falls; breathing problems  signs and symptoms of a stroke like changes in vision; confusion; trouble speaking or understanding; severe headaches; sudden numbness or weakness of the face, arm or leg; trouble walking; dizziness; loss of balance or coordination  signs and symptoms of serotonin syndrome like irritable; confusion; diarrhea; fast or irregular heartbeat; muscle twitching; stiff muscles; trouble walking; sweating; high fever; seizures; chills; vomiting Side effects that usually do not require medical attention (report to your doctor or health care professional if they continue or are bothersome):  diarrhea  dizziness  drowsiness  dry mouth  headache  nausea, vomiting  pain, tingling, numbness in the hands or feet  stomach pain This list may not describe all possible side effects. Call your doctor for medical advice about side effects. You may report side effects to FDA at 1-800-FDA-1088. Where should I keep my medicine? Keep out of the reach of children. Store at room temperature between 15 and 30 degrees C (59 and 86 degrees F). Protect from light and moisture. Throw away any unused medicine after the expiration date. NOTE: This sheet is a summary. It may not cover all possible information. If you have questions about this medicine, talk to your doctor, pharmacist, or health care provider.  2020 Elsevier/Gold Standard (2018-04-07 14:58:08)

## 2020-04-28 LAB — COMPREHENSIVE METABOLIC PANEL
ALT: 25 IU/L (ref 0–32)
AST: 20 IU/L (ref 0–40)
Albumin/Globulin Ratio: 1.4 (ref 1.2–2.2)
Albumin: 4.2 g/dL (ref 3.8–4.8)
Alkaline Phosphatase: 95 IU/L (ref 48–121)
BUN/Creatinine Ratio: 14 (ref 9–23)
BUN: 11 mg/dL (ref 6–20)
Bilirubin Total: 0.3 mg/dL (ref 0.0–1.2)
CO2: 24 mmol/L (ref 20–29)
Calcium: 9.1 mg/dL (ref 8.7–10.2)
Chloride: 105 mmol/L (ref 96–106)
Creatinine, Ser: 0.8 mg/dL (ref 0.57–1.00)
GFR calc Af Amer: 111 mL/min/{1.73_m2} (ref >59)
GFR calc non Af Amer: 96 mL/min/{1.73_m2} (ref >59)
Globulin, Total: 3 g/dL (ref 1.5–4.5)
Glucose: 125 mg/dL — ABNORMAL HIGH (ref 65–99)
Potassium: 4.2 mmol/L (ref 3.5–5.2)
Sodium: 141 mmol/L (ref 134–144)
Total Protein: 7.2 g/dL (ref 6.0–8.5)

## 2020-04-29 ENCOUNTER — Telehealth: Payer: Self-pay | Admitting: Neurology

## 2020-04-29 NOTE — Telephone Encounter (Signed)
[employer] order sent to [facility]. They will obtain the auth and reach out to the patient to schedule.  

## 2020-05-02 ENCOUNTER — Telehealth: Payer: Self-pay | Admitting: Neurology

## 2020-05-02 NOTE — Telephone Encounter (Signed)
error 

## 2020-05-18 ENCOUNTER — Other Ambulatory Visit: Payer: Self-pay

## 2020-05-18 ENCOUNTER — Encounter: Payer: Self-pay | Admitting: Adult Health

## 2020-05-18 ENCOUNTER — Ambulatory Visit (INDEPENDENT_AMBULATORY_CARE_PROVIDER_SITE_OTHER): Payer: 59 | Admitting: Adult Health

## 2020-05-18 DIAGNOSIS — F331 Major depressive disorder, recurrent, moderate: Secondary | ICD-10-CM | POA: Diagnosis not present

## 2020-05-18 DIAGNOSIS — F422 Mixed obsessional thoughts and acts: Secondary | ICD-10-CM | POA: Diagnosis not present

## 2020-05-18 DIAGNOSIS — F319 Bipolar disorder, unspecified: Secondary | ICD-10-CM

## 2020-05-18 DIAGNOSIS — F411 Generalized anxiety disorder: Secondary | ICD-10-CM

## 2020-05-18 DIAGNOSIS — G4700 Insomnia, unspecified: Secondary | ICD-10-CM

## 2020-05-18 MED ORDER — LAMOTRIGINE 100 MG PO TABS: 100.0000 mg | ORAL_TABLET | Freq: Two times a day (BID) | ORAL | 2 refills | Status: DC

## 2020-05-18 MED ORDER — BUSPIRONE HCL 10 MG PO TABS: 10.0000 mg | ORAL_TABLET | Freq: Two times a day (BID) | ORAL | 2 refills | Status: DC

## 2020-05-18 MED ORDER — CARIPRAZINE HCL 1.5 MG PO CAPS: 1.5000 mg | ORAL_CAPSULE | Freq: Every day | ORAL | 2 refills | Status: DC

## 2020-05-18 NOTE — Progress Notes (Signed)
Jill [name] [id number] 03-30-1986 34 y.o.  Subjective:   Patient ID:  Jill [name] is a 34 y.o. (DOB Apr 29, 1986) female.  Chief Complaint: No chief complaint on file.   HPI Zamyra Allensworth presents to the office today for follow-up of MDD, BPD1, insomnia, and OCD.  Describes mood today as "better". Pleasant. Flat. Tearful "not as much as I was". Mood symptoms - reports anxiety and depression. Irritability has gotten "much better". Husband has noticed a difference. Able to get out of the bed. Stating "I still worry a lot". Asking "what if questions". Denies mania. Denies over spending.  Works full time at [employer]. Mother-in-law taking care of son during the day. Stable interest and motivation.Taking medications as prescribed.  Energy levels "better". Active, does not have a regular exercise routine. Walking 15 to 20 minutes. Enjoys some usual interests and activities. Married. Lives with husband of 5 years and newborn son - 17 months old. Family local. Sister lives in [place]. Appetite adequate. Weight loss - 6 pounds - 275 pounds. Sleeping difficulties. Averages 6 to 8 hours. Focus and concentration "getting better". Completing tasks. Managing some aspects of household. Getting work done.  Denies SI or HI. Denies AH or VH.   Hospitalized in 2016 for paranoia, substance use, reckless behaviors, and overspending and was started on Geodon but had to stop due to costs.   Previous medication trials: Lamictal, Zoloft, Luvox, Geodon   GAD-7     Office Visit from 02/21/2020 in [facility]  Total GAD-7 Score 7    PHQ2-9     Office Visit from 02/21/2020 in [facility]  PHQ-2 Total Score 2  PHQ-9 Total Score 6       Review of Systems:  Review of Systems  Musculoskeletal: Negative for gait problem.  Neurological: Negative for tremors.  Psychiatric/Behavioral:       Please refer to HPI    Medications: I have reviewed the patient's current  medications.  Current Outpatient Medications  Medication Sig Dispense Refill  . busPIRone (BUSPAR) 10 MG tablet Take 1 tablet (10 mg total) by mouth 2 (two) times daily. 60 tablet 2  . cariprazine (VRAYLAR) capsule Take 1 capsule (1.5 mg total) by mouth daily. 30 capsule 2  . Fluvoxamine Maleate 100 MG CP24 Take 1 capsule (100 mg total) by mouth daily. 90 capsule 1  . Fremanezumab-vfrm (AJOVY) 225 MG/1.5ML SOAJ Inject 225 mg into the skin every 30 (thirty) days. 1 pen 11  . lamoTRIgine (LAMICTAL) 100 MG tablet Take 1 tablet (100 mg total) by mouth 2 (two) times daily. 60 tablet 2  . ondansetron (ZOFRAN-ODT) 4 MG disintegrating tablet Take 1 tablet (4 mg total) by mouth every 8 (eight) hours as needed for nausea. 30 tablet 3  . rizatriptan (MAXALT-MLT) 10 MG disintegrating tablet Take 1 tablet (10 mg total) by mouth as needed for migraine. May repeat in 2 hours if needed 9 tablet 11   Current Facility-Administered Medications  Medication Dose Route Frequency Provider Last Rate Last Admin  . Fremanezumab-vfrm SOSY 225 mg  225 mg Subcutaneous Once Anson Fret, MD        Medication Side Effects: None  Allergies:  Allergies  Allergen Reactions  . Amoxicillin Anaphylaxis, Shortness Of Breath and Rash    Childhood reaction  Has patient had a PCN reaction causing immediate rash, facial/tongue/throat swelling, SOB or lightheadedness with hypotension: Yes Has patient had a PCN reaction causing severe rash involving mucus membranes or skin necrosis:  Unknown Has patient had a PCN reaction that required hospitalization: No Has patient had a PCN reaction occurring within the last 10 years: No If all of the above answers are "NO", then may proceed with Cephalosporin use.  Marland Kitchen Penicillins     Pt does not think that she is allergic to this medication but is not sure     Past Medical History:  Diagnosis Date  . Bipolar 1 disorder (HCC)   . Depression   . Migraine   . OCD (obsessive compulsive  disorder)   . TMJ (dislocation of temporomandibular joint)     Family History  Problem Relation Age of Onset  . Depression Mother   . Alcohol abuse Father   . Lung cancer Maternal Grandmother   . Hypertension Maternal Grandmother   . Hypertension Maternal Grandfather   . Stroke Maternal Grandfather   . Hypertension Paternal Grandmother   . Headache Paternal Grandmother   . Hypertension Paternal Grandfather   . Breast cancer Neg Hx     Social History   Socioeconomic History  . Marital status: Married    Spouse name: [name]  . Number of children: Not on file  . Years of education: Not on file  . Highest education level: Not on file  Occupational History  . Occupation: Lab assisstant  Tobacco Use  . Smoking status: Never Smoker  . Smokeless tobacco: Never Used  Vaping Use  . Vaping Use: Never used  Substance and Sexual Activity  . Alcohol use: No    Alcohol/week: 0.0 standard drinks  . Drug use: No  . Sexual activity: Yes    Partners: Male    Birth control/protection: None  Other Topics Concern  . Not on file  Social History Narrative   She is originally from [place] and graduated high school in [place]. She has been married for approximately 2years and has no children. She has been working for [employer] for the past one month. She currently lives with her husband. She denies any physical or sexual abuse.   Social Determinants of Health   Financial Resource Strain:   . Difficulty of Paying Living Expenses:   Food Insecurity:   . Worried About Programme researcher, broadcasting/film/video in the Last Year:   . Barista in the Last Year:   Transportation Needs:   . Freight forwarder (Medical):   Marland Kitchen Lack of Transportation (Non-Medical):   Physical Activity:   . Days of Exercise per Week:   . Minutes of Exercise per Session:   Stress:   . Feeling of Stress :   Social Connections:   . Frequency of Communication with Friends and Family:   . Frequency of Social Gatherings with  Friends and Family:   . Attends Religious Services:   . Active Member of Clubs or Organizations:   . Attends Banker Meetings:   Marland Kitchen Marital Status:   Intimate Partner Violence:   . Fear of Current or Ex-Partner:   . Emotionally Abused:   Marland Kitchen Physically Abused:   . Sexually Abused:     Past Medical History, Surgical history, Social history, and Family history were reviewed and updated as appropriate.   Please see review of systems for further details on the patient's review from today.   Objective:   Physical Exam:  There were no vitals taken for this visit.  Physical Exam Constitutional:      General: She is not in acute distress. Musculoskeletal:  General: No deformity.  Neurological:     Mental Status: She is alert and oriented to person, place, and time.     Coordination: Coordination normal.  Psychiatric:        Attention and Perception: Attention and perception normal. She does not perceive auditory or visual hallucinations.        Mood and Affect: Mood is anxious and depressed. Affect is not labile, blunt, angry or inappropriate.        Speech: Speech normal.        Behavior: Behavior normal.        Thought Content: Thought content normal. Thought content is not paranoid or delusional. Thought content does not include homicidal or suicidal ideation. Thought content does not include homicidal or suicidal plan.        Cognition and Memory: Cognition and memory normal.        Judgment: Judgment normal.     Comments: Insight intact     Lab Review:     Component Value Date/Time   NA 141 04/27/2020 1019   NA 139 01/31/2015 1329   K 4.2 04/27/2020 1019   K 3.7 01/31/2015 1329   CL 105 04/27/2020 1019   CL 107 01/31/2015 1329   CO2 24 04/27/2020 1019   CO2 26 01/31/2015 1329   GLUCOSE 125 (H) 04/27/2020 1019   GLUCOSE 85 08/19/2019 1922   GLUCOSE 86 01/31/2015 1329   BUN 11 04/27/2020 1019   BUN 9 01/31/2015 1329   CREATININE 0.80 04/27/2020 1019    CREATININE 0.63 01/31/2015 1329   CALCIUM 9.1 04/27/2020 1019   CALCIUM 8.8 (L) 01/31/2015 1329   PROT 7.2 04/27/2020 1019   PROT 7.6 01/31/2015 1329   ALBUMIN 4.2 04/27/2020 1019   ALBUMIN 3.8 01/31/2015 1329   AST 20 04/27/2020 1019   AST 24 01/31/2015 1329   ALT 25 04/27/2020 1019   ALT 19 01/31/2015 1329   ALKPHOS 95 04/27/2020 1019   ALKPHOS 73 01/31/2015 1329   BILITOT 0.3 04/27/2020 1019   BILITOT 0.7 01/31/2015 1329   GFRNONAA 96 04/27/2020 1019   GFRNONAA >60 01/31/2015 1329   GFRAA 111 04/27/2020 1019   GFRAA >60 01/31/2015 1329       Component Value Date/Time   WBC 9.6 02/21/2020 1119   WBC 17.0 (H) 08/22/2019 0741   RBC 5.10 02/21/2020 1119   RBC 3.56 (L) 08/22/2019 0741   HGB 13.4 02/21/2020 1119   HCT 40.9 02/21/2020 1119   PLT 319 08/22/2019 0741   PLT 314 06/02/2019 0932   MCV 80 02/21/2020 1119   MCV 85 01/31/2015 1329   MCH 26.3 (L) 02/21/2020 1119   MCH 28.4 08/22/2019 0741   MCHC 32.8 02/21/2020 1119   MCHC 32.9 08/22/2019 0741   RDW 13.8 02/21/2020 1119   RDW 13.4 01/31/2015 1329   LYMPHSABS 3.4 (H) 02/21/2020 1119   MONOABS 0.7 02/24/2018 0405   EOSABS 0.2 02/21/2020 1119   BASOSABS 0.1 02/21/2020 1119    No results found for: POCLITH, LITHIUM   No results found for: PHENYTOIN, PHENOBARB, VALPROATE, CBMZ   .res Assessment: Plan:    Plan:  PDMP reviewed  Lamictal 100mg  at hs Vraylar 1.5mg  daily Buspar 10mg  BID  RTC 4 weeks  Patient advised to contact office with any questions, adverse effects, or acute worsening in signs and symptoms.  Counseled patient regarding potential benefits, risks, and side effects of Lamictal to include potential risk of Stevens-Johnson syndrome. Advised patient to stop taking Lamictal and  contact office immediately if rash develops and to seek urgent medical attention if rash is severe and/or spreading quickly.  Discussed potential benefits, risk, and side effects of benzodiazepines to include  potential risk of tolerance and dependence, as well as possible drowsiness.  Advised patient not to drive if experiencing drowsiness and to take lowest possible effective dose to minimize risk of dependence and tolerance.     Diagnoses and all orders for this visit:  Major depressive disorder, recurrent episode, moderate (HCC)  Bipolar I disorder (HCC) -     lamoTRIgine (LAMICTAL) 100 MG tablet; Take 1 tablet (100 mg total) by mouth 2 (two) times daily. -     cariprazine (VRAYLAR) capsule; Take 1 capsule (1.5 mg total) by mouth daily.  Insomnia, unspecified type  Mixed obsessional thoughts and acts  Generalized anxiety disorder -     busPIRone (BUSPAR) 10 MG tablet; Take 1 tablet (10 mg total) by mouth 2 (two) times daily.     Please see After Visit Summary for patient specific instructions.  Future Appointments  Date Time Provider Department Center  05/24/2020 11:20 AM [facility] MR 3 [id number]  08/14/2020 11:00 AM Anson FretAhern, Antonia B, MD [facility] None    No orders of the defined types were placed in this encounter.   -------------------------------

## 2020-05-24 ENCOUNTER — Other Ambulatory Visit: Payer: Self-pay

## 2020-05-24 ENCOUNTER — Ambulatory Visit
Admission: RE | Admit: 2020-05-24 | Discharge: 2020-05-24 | Disposition: A | Discharge status: Home / Self Care | Payer: Managed Care, Other (non HMO) | Source: Ambulatory Visit | Attending: Neurology | Admitting: Neurology

## 2020-05-24 ENCOUNTER — Other Ambulatory Visit: Payer: Managed Care, Other (non HMO)

## 2020-05-24 DIAGNOSIS — R51 Headache with orthostatic component, not elsewhere classified: Secondary | ICD-10-CM

## 2020-05-24 DIAGNOSIS — H53453 Other localized visual field defect, bilateral: Secondary | ICD-10-CM

## 2020-05-24 DIAGNOSIS — H5460 Unqualified visual loss, one eye, unspecified: Secondary | ICD-10-CM

## 2020-05-24 DIAGNOSIS — R519 Headache, unspecified: Secondary | ICD-10-CM

## 2020-05-24 MED ORDER — GADOBENATE DIMEGLUMINE 529 MG/ML IV SOLN
20.0000 mL | Freq: Once | INTRAVENOUS | Status: AC | PRN
  Administered 2020-05-24: 20 mL via INTRAVENOUS

## 2020-05-25 ENCOUNTER — Telehealth: Payer: Self-pay

## 2020-05-25 NOTE — Telephone Encounter (Signed)
Patient's pharmacy initiated a prior authorization for VRAYLAR 1.5 MG, tried to submit through cover my meds but patient not found. Contacted pharmacy and they gave me the same information as listed. Called [employer] at [phone], they reported that her last name on her insurance card is Harlon Flor so it has to run under that name. Submitted over the phone and received approval effective 05/25/2020-05/25/2021.

## 2020-06-01 ENCOUNTER — Telehealth: Payer: Self-pay | Admitting: *Deleted

## 2020-06-01 NOTE — Telephone Encounter (Signed)
We received a determination from [facility]. Arnetha Massy has been approved through 06/01/2021. Reference # [id number].  I faxed the approval to pt's pharmacy. Received a receipt of confirmation.

## 2020-06-01 NOTE — Telephone Encounter (Signed)
Received [employer] PA form via fax from [facility]. Form completed and faxed back to [facility] along with office note for supporting documentation. Received a receipt of confirmation.

## 2020-06-20 ENCOUNTER — Ambulatory Visit: Payer: 59 | Admitting: Adult Health

## 2020-07-03 ENCOUNTER — Other Ambulatory Visit: Payer: Self-pay

## 2020-07-03 DIAGNOSIS — F319 Bipolar disorder, unspecified: Secondary | ICD-10-CM

## 2020-07-03 MED ORDER — CARIPRAZINE HCL 1.5 MG PO CAPS: 1.5000 mg | ORAL_CAPSULE | Freq: Every day | ORAL | 0 refills | Status: DC

## 2020-08-14 ENCOUNTER — Ambulatory Visit: Payer: Managed Care, Other (non HMO) | Admitting: Neurology

## 2020-09-05 ENCOUNTER — Other Ambulatory Visit: Payer: Self-pay

## 2020-09-05 DIAGNOSIS — F319 Bipolar disorder, unspecified: Secondary | ICD-10-CM

## 2020-09-05 MED ORDER — CARIPRAZINE HCL 1.5 MG PO CAPS: 1.5000 mg | ORAL_CAPSULE | Freq: Every day | ORAL | 0 refills | Status: DC

## 2020-09-06 ENCOUNTER — Ambulatory Visit (INDEPENDENT_AMBULATORY_CARE_PROVIDER_SITE_OTHER): Payer: 59 | Admitting: Adult Health

## 2020-09-06 ENCOUNTER — Other Ambulatory Visit: Payer: Self-pay

## 2020-09-06 ENCOUNTER — Encounter: Payer: Self-pay | Admitting: Adult Health

## 2020-09-06 DIAGNOSIS — G4700 Insomnia, unspecified: Secondary | ICD-10-CM

## 2020-09-06 DIAGNOSIS — F331 Major depressive disorder, recurrent, moderate: Secondary | ICD-10-CM

## 2020-09-06 DIAGNOSIS — F411 Generalized anxiety disorder: Secondary | ICD-10-CM

## 2020-09-06 DIAGNOSIS — F319 Bipolar disorder, unspecified: Secondary | ICD-10-CM

## 2020-09-06 MED ORDER — CARIPRAZINE HCL 3 MG PO CAPS: 3.0000 mg | ORAL_CAPSULE | Freq: Every day | ORAL | 3 refills | Status: DC

## 2020-09-06 MED ORDER — LAMOTRIGINE 100 MG PO TABS: 100.0000 mg | ORAL_TABLET | Freq: Two times a day (BID) | ORAL | 3 refills | Status: DC

## 2020-09-06 MED ORDER — BUSPIRONE HCL 10 MG PO TABS: 10.0000 mg | ORAL_TABLET | Freq: Two times a day (BID) | ORAL | 3 refills | Status: DC

## 2020-09-06 NOTE — Progress Notes (Signed)
Jill [name] [id number] 10-09-1985 34 y.o.  Subjective:   Patient ID:  Jill [name] is a 34 y.o. (DOB 10-09-1985) female.  Chief Complaint: No chief complaint on file.   HPI Jill [name] presents to the office today for follow-up of MDD, BPD1, insomnia, and OCD.  Describes mood today as "not very good". Pleasant. Flat. Tearful "once a day". Mood symptoms - reports anxiety and depression. Denies irritability. Symptoms have gotten worse over the past few months. Cannot identify a precipitant. Husband has noticed she's been "bad". Increased worry and ruminations. Stating "I still worry a lot". Asking "what if's". Makes herself "sick" with all the intrusive thoughts. Denies mania. Denies over spending. Works full time at [employer]. Mother-in-law taking care of son during the day. Stable interest and motivation.Taking medications as prescribed.  Energy levels lower. Active, does not have a regular exercise routine.  Enjoys some usual interests and activities. Married. Lives with husband of 5 years and 35 year old son. Family local. Sister lives in [place]. Appetite adequate. Weight loss - 6 pounds - 269 pounds. Sleeping difficulties. Averages 4 hours. Focus and concentration "not too good over past few weeks". Completing tasks. Managing some aspects of household. Working 8 to 10 hours a day.  Denies SI or HI.  Denies AH or VH. Feeling paranoid over the past few months - people talking about her - if I don't do something a certain way something will happen to children.  Hospitalized in 2016 for paranoia, substance use, reckless behaviors, and overspending and was started on Geodon but had to stop due to costs.   Previous medication trials: Lamictal, Zoloft, Luvox, Geodon     GAD-7     [facility]/2020 [facility]  Total GAD-7 Score 7    PHQ2-9     [facility]21/2020 [facility]  PHQ-2 Total Score 2  PHQ-9 Total Score  6       Review of Systems:  Review of Systems  Musculoskeletal: Negative for gait problem.  Neurological: Negative for tremors.  Psychiatric/Behavioral:       Please refer to HPI    Medications: I have reviewed the patient's current medications.  Current Outpatient Medications  Medication Sig Dispense Refill  . busPIRone (BUSPAR) 10 MG tablet Take 1 tablet (10 mg total) by mouth 2 (two) times daily. 180 tablet 3  . cariprazine (VRAYLAR) capsule Take 1 capsule (3 mg total) by mouth daily. 90 capsule 3  . Fluvoxamine Maleate 100 MG CP24 Take 1 capsule (100 mg total) by mouth daily. 90 capsule 1  . Fremanezumab-vfrm (AJOVY) 225 MG/1.5ML SOAJ Inject 225 mg into the skin every 30 (thirty) days. 1 pen 11  . lamoTRIgine (LAMICTAL) 100 MG tablet Take 1 tablet (100 mg total) by mouth 2 (two) times daily. 180 tablet 3  . ondansetron (ZOFRAN-ODT) 4 MG disintegrating tablet Take 1 tablet (4 mg total) by mouth every 8 (eight) hours as needed for nausea. 30 tablet 3  . rizatriptan (MAXALT-MLT) 10 MG disintegrating tablet Take 1 tablet (10 mg total) by mouth as needed for migraine. May repeat in 2 hours if needed 9 tablet 11   Current Facility-Administered Medications  Medication Dose Route Frequency Provider Last Rate Last Admin  . Fremanezumab-vfrm SOSY 225 mg  225 mg Subcutaneous Once Anson FretAhern, Antonia B, MD        Medication Side Effects: None  Allergies:  Allergies  Allergen Reactions  . Amoxicillin Anaphylaxis, Shortness  Of Breath and Rash    Childhood reaction  Has patient had a PCN reaction causing immediate rash, facial/tongue/throat swelling, SOB or lightheadedness with hypotension: Yes Has patient had a PCN reaction causing severe rash involving mucus membranes or skin necrosis: Unknown Has patient had a PCN reaction that required hospitalization: No Has patient had a PCN reaction occurring within the last 10 years: No If all of the above answers are "NO", then may proceed with  Cephalosporin use.  Marland Kitchen Penicillins     Pt does not think that she is allergic to this medication but is not sure     Past Medical History:  Diagnosis Date  . Bipolar 1 disorder (HCC)   . Depression   . Migraine   . OCD (obsessive compulsive disorder)   . TMJ (dislocation of temporomandibular joint)     Family History  Problem Relation Age of Onset  . Depression Mother   . Alcohol abuse Father   . Lung cancer Maternal Grandmother   . Hypertension Maternal Grandmother   . Hypertension Maternal Grandfather   . Stroke Maternal Grandfather   . Hypertension Paternal Grandmother   . Headache Paternal Grandmother   . Hypertension Paternal Grandfather   . Breast cancer Neg Hx     Social History   Socioeconomic History  . Marital status: Married    Spouse name: [name]  . Number of children: Not on file  . Years of education: Not on file  . Highest education level: Not on file  Occupational History  . Occupation: Lab assisstant  Tobacco Use  . Smoking status: Never Smoker  . Smokeless tobacco: Never Used  Vaping Use  . Vaping Use: Never used  Substance and Sexual Activity  . Alcohol use: No    Alcohol/week: 0.0 standard drinks  . Drug use: No  . Sexual activity: Yes    Partners: Male    Birth control/protection: None  Other Topics Concern  . Not on file  Social History Narrative   She is originally from [place] and graduated high school in [place]. She has been married for approximately 2years and has no children. She has been working for [employer] for the past one month. She currently lives with her husband. She denies any physical or sexual abuse.   Social Determinants of Health   Financial Resource Strain:   . Difficulty of Paying Living Expenses: Not on file  Food Insecurity:   . Worried About Programme researcher, broadcasting/film/video in the Last Year: Not on file  . Ran Out of Food in the Last Year: Not on file  Transportation Needs:   . Lack of Transportation (Medical): Not on  file  . Lack of Transportation (Non-Medical): Not on file  Physical Activity:   . Days of Exercise per Week: Not on file  . Minutes of Exercise per Session: Not on file  Stress:   . Feeling of Stress : Not on file  Social Connections:   . Frequency of Communication with Friends and Family: Not on file  . Frequency of Social Gatherings with Friends and Family: Not on file  . Attends Religious Services: Not on file  . Active Member of Clubs or Organizations: Not on file  . Attends Banker Meetings: Not on file  . Marital Status: Not on file  Intimate Partner Violence:   . Fear of Current or Ex-Partner: Not on file  . Emotionally Abused: Not on file  . Physically Abused: Not on file  . Sexually  Abused: Not on file    Past Medical History, Surgical history, Social history, and Family history were reviewed and updated as appropriate.   Please see review of systems for further details on the patient's review from today.   Objective:   Physical Exam:  There were no vitals taken for this visit.  Physical Exam Constitutional:      General: She is not in acute distress. Musculoskeletal:        General: No deformity.  Neurological:     Mental Status: She is alert and oriented to person, place, and time.     Coordination: Coordination normal.  Psychiatric:        Attention and Perception: Attention and perception normal. She does not perceive auditory or visual hallucinations.        Mood and Affect: Mood is anxious and depressed. Affect is not labile, blunt, angry or inappropriate.        Speech: Speech normal.        Behavior: Behavior normal.        Thought Content: Thought content is paranoid. Thought content is not delusional. Thought content does not include homicidal or suicidal ideation. Thought content does not include homicidal or suicidal plan.        Cognition and Memory: Cognition and memory normal.        Judgment: Judgment normal.     Comments: Insight  intact     Lab Review:     Component Value Date/Time   NA 141 04/27/2020 1019   NA 139 01/31/2015 1329   K 4.2 04/27/2020 1019   K 3.7 01/31/2015 1329   CL 105 04/27/2020 1019   CL 107 01/31/2015 1329   CO2 24 04/27/2020 1019   CO2 26 01/31/2015 1329   GLUCOSE 125 (H) 04/27/2020 1019   GLUCOSE 85 08/19/2019 1922   GLUCOSE 86 01/31/2015 1329   BUN 11 04/27/2020 1019   BUN 9 01/31/2015 1329   CREATININE 0.80 04/27/2020 1019   CREATININE 0.63 01/31/2015 1329   CALCIUM 9.1 04/27/2020 1019   CALCIUM 8.8 (L) 01/31/2015 1329   PROT 7.2 04/27/2020 1019   PROT 7.6 01/31/2015 1329   ALBUMIN 4.2 04/27/2020 1019   ALBUMIN 3.8 01/31/2015 1329   AST 20 04/27/2020 1019   AST 24 01/31/2015 1329   ALT 25 04/27/2020 1019   ALT 19 01/31/2015 1329   ALKPHOS 95 04/27/2020 1019   ALKPHOS 73 01/31/2015 1329   BILITOT 0.3 04/27/2020 1019   BILITOT 0.7 01/31/2015 1329   GFRNONAA 96 04/27/2020 1019   GFRNONAA >60 01/31/2015 1329   GFRAA 111 04/27/2020 1019   GFRAA >60 01/31/2015 1329       Component Value Date/Time   WBC 9.6 02/21/2020 1119   WBC 17.0 (H) 08/22/2019 0741   RBC 5.10 02/21/2020 1119   RBC 3.56 (L) 08/22/2019 0741   HGB 13.4 02/21/2020 1119   HCT 40.9 02/21/2020 1119   PLT 319 08/22/2019 0741   PLT 314 06/02/2019 0932   MCV 80 02/21/2020 1119   MCV 85 01/31/2015 1329   MCH 26.3 (L) 02/21/2020 1119   MCH 28.4 08/22/2019 0741   MCHC 32.8 02/21/2020 1119   MCHC 32.9 08/22/2019 0741   RDW 13.8 02/21/2020 1119   RDW 13.4 01/31/2015 1329   LYMPHSABS 3.4 (H) 02/21/2020 1119   MONOABS 0.7 02/24/2018 0405   EOSABS 0.2 02/21/2020 1119   BASOSABS 0.1 02/21/2020 1119    No results found for: POCLITH, LITHIUM   No  results found for: PHENYTOIN, PHENOBARB, VALPROATE, CBMZ   .res Assessment: Plan:    Plan:  PDMP reviewed  Lamictal 100mg  BID Vraylar 1.5mg  daily Buspar 10mg  BID  RTC 4 weeks  Patient advised to contact office with any questions, adverse effects, or  acute worsening in signs and symptoms.  Counseled patient regarding potential benefits, risks, and side effects of Lamictal to include potential risk of Stevens-Johnson syndrome. Advised patient to stop taking Lamictal and contact office immediately if rash develops and to seek urgent medical attention if rash is severe and/or spreading quickly.  Discussed potential metabolic side effects associated with atypical antipsychotics, as well as potential risk for movement side effects. Advised pt to contact office if movement side effects occur.    Diagnoses and all orders for this visit:  Major depressive disorder, recurrent episode, moderate (HCC)  Bipolar I disorder (HCC) -     lamoTRIgine (LAMICTAL) 100 MG tablet; Take 1 tablet (100 mg total) by mouth 2 (two) times daily. -     cariprazine (VRAYLAR) capsule; Take 1 capsule (3 mg total) by mouth daily.  Generalized anxiety disorder -     busPIRone (BUSPAR) 10 MG tablet; Take 1 tablet (10 mg total) by mouth 2 (two) times daily.  Insomnia, unspecified type     Please see After Visit Summary for patient specific instructions.  No future appointments.  No orders of the defined types were placed in this encounter.   -------------------------------

## 2020-10-09 ENCOUNTER — Ambulatory Visit: Payer: 59 | Admitting: Adult Health

## 2020-10-23 ENCOUNTER — Ambulatory Visit: Payer: 59 | Admitting: Adult Health

## 2020-11-02 ENCOUNTER — Ambulatory Visit: Payer: 59 | Admitting: Adult Health

## 2020-11-29 ENCOUNTER — Encounter: Payer: Self-pay | Admitting: Adult Health

## 2020-11-29 ENCOUNTER — Other Ambulatory Visit: Payer: Self-pay

## 2020-11-29 ENCOUNTER — Ambulatory Visit (INDEPENDENT_AMBULATORY_CARE_PROVIDER_SITE_OTHER): Payer: 59 | Admitting: Adult Health

## 2020-11-29 DIAGNOSIS — F411 Generalized anxiety disorder: Secondary | ICD-10-CM | POA: Diagnosis not present

## 2020-11-29 DIAGNOSIS — F331 Major depressive disorder, recurrent, moderate: Secondary | ICD-10-CM

## 2020-11-29 DIAGNOSIS — F319 Bipolar disorder, unspecified: Secondary | ICD-10-CM | POA: Diagnosis not present

## 2020-11-29 DIAGNOSIS — F422 Mixed obsessional thoughts and acts: Secondary | ICD-10-CM | POA: Diagnosis not present

## 2020-11-29 DIAGNOSIS — G4700 Insomnia, unspecified: Secondary | ICD-10-CM

## 2020-11-29 MED ORDER — LAMOTRIGINE 100 MG PO TABS: 100.0000 mg | ORAL_TABLET | Freq: Two times a day (BID) | ORAL | 3 refills | Status: DC

## 2020-11-29 MED ORDER — BUSPIRONE HCL 10 MG PO TABS: 10.0000 mg | ORAL_TABLET | Freq: Two times a day (BID) | ORAL | 3 refills | Status: DC

## 2020-11-29 MED ORDER — CARIPRAZINE HCL 3 MG PO CAPS: 3.0000 mg | ORAL_CAPSULE | Freq: Every day | ORAL | 3 refills | Status: DC

## 2020-11-29 MED ORDER — FLUVOXAMINE MALEATE 50 MG PO TABS: ORAL_TABLET | ORAL | 2 refills | Status: DC

## 2020-11-29 NOTE — Progress Notes (Signed)
Jill [name] [id number] Feb 02, 1986 34 y.o.  Subjective:   Patient ID:  Jill [name] is a 35 y.o. (DOB [birth year]) female.  Chief Complaint: No chief complaint on file.   HPI Jill [name] presents to the office today for follow-up of MDD, BPD1, insomnia, and OCD.    Describes mood today as "not so good". Pleasant. Flat. Tearful. Mood symptoms - reports anxiety and depression. Denies irritability. Anxiety is "really bad". Sees things on TV and thinks it will happen to her. Getting anxious when she drives - husband drove her today. Not going to work some days because she is anxious to drive there. Missing a day every other day. Intrusive thoughts. Denies mania. Denies over spending. Works full time at [employer]. Mother-in-law taking care of son during the day. Stable interest and motivation.Taking medications as prescribed.  Energy levels lower. Active, does not have a regular exercise routine.  Enjoys some usual interests and activities. Married. Lives with husband of 5 years and 14 months old son. Family local. Sister lives in [place]. Appetite adequate. Weight stable - 269 pounds. Sleeping difficulties. Averages 4 hours - "tossing and turning all night". Focus and concentration "not too good over past few weeks". Completing tasks. Managing some aspects of household. Working 8 hours a day.  Denies SI or HI.  Denies AH or VH. Feeling more paranoid lately.   Hospitalized in 2016 for paranoia, substance use, reckless behaviors, and overspending and was started on Geodon but had to stop due to costs.   Previous medication trials: Lamictal, Zoloft, Luvox, Geodon    GAD-7   Flowsheet Row Office Visit from 02/21/2020 in [facility]  Total GAD-7 Score 7    PHQ2-9   Flowsheet Row Office Visit from 02/21/2020 in [facility]  PHQ-2 Total Score 2  PHQ-9 Total Score 6       Review of Systems:  Review of Systems  Medications: I have reviewed  the patient's current medications.  Current Outpatient Medications  Medication Sig Dispense Refill  . fluvoxaMINE (LUVOX) 50 MG tablet Take one tablet daily x 7 days, then take two tablets daily. 60 tablet 2  . busPIRone (BUSPAR) 10 MG tablet Take 1 tablet (10 mg total) by mouth 2 (two) times daily. 180 tablet 3  . cariprazine (VRAYLAR) capsule Take 1 capsule (3 mg total) by mouth daily. 90 capsule 3  . Fremanezumab-vfrm (AJOVY) 225 MG/1.5ML SOAJ Inject 225 mg into the skin every 30 (thirty) days. 1 pen 11  . lamoTRIgine (LAMICTAL) 100 MG tablet Take 1 tablet (100 mg total) by mouth 2 (two) times daily. 180 tablet 3  . ondansetron (ZOFRAN-ODT) 4 MG disintegrating tablet Take 1 tablet (4 mg total) by mouth every 8 (eight) hours as needed for nausea. 30 tablet 3  . rizatriptan (MAXALT-MLT) 10 MG disintegrating tablet Take 1 tablet (10 mg total) by mouth as needed for migraine. May repeat in 2 hours if needed 9 tablet 11   Current Facility-Administered Medications  Medication Dose Route Frequency Provider Last Rate Last Admin  . Fremanezumab-vfrm SOSY 225 mg  225 mg Subcutaneous Once Anson Fret, MD        Medication Side Effects: None  Allergies:  Allergies  Allergen Reactions  . Amoxicillin Anaphylaxis, Shortness Of Breath and Rash    Childhood reaction  Has patient had a PCN reaction causing immediate rash, facial/tongue/throat swelling, SOB or lightheadedness with hypotension: Yes Has patient had a PCN reaction causing severe rash involving mucus  membranes or skin necrosis: Unknown Has patient had a PCN reaction that required hospitalization: No Has patient had a PCN reaction occurring within the last 10 years: No If all of the above answers are "NO", then may proceed with Cephalosporin use.  Marland Kitchen Penicillins     Pt does not think that she is allergic to this medication but is not sure     Past Medical History:  Diagnosis Date  . Bipolar 1 disorder (HCC)   . Depression   .  Migraine   . OCD (obsessive compulsive disorder)   . TMJ (dislocation of temporomandibular joint)     Family History  Problem Relation Age of Onset  . Depression Mother   . Alcohol abuse Father   . Lung cancer Maternal Grandmother   . Hypertension Maternal Grandmother   . Hypertension Maternal Grandfather   . Stroke Maternal Grandfather   . Hypertension Paternal Grandmother   . Headache Paternal Grandmother   . Hypertension Paternal Grandfather   . Breast cancer Neg Hx     Social History   Socioeconomic History  . Marital status: Married    Spouse name: [name]  . Number of children: Not on file  . Years of education: Not on file  . Highest education level: Not on file  Occupational History  . Occupation: Lab assisstant  Tobacco Use  . Smoking status: Never Smoker  . Smokeless tobacco: Never Used  Vaping Use  . Vaping Use: Never used  Substance and Sexual Activity  . Alcohol use: No    Alcohol/week: 0.0 standard drinks  . Drug use: No  . Sexual activity: Yes    Partners: Male    Birth control/protection: None  Other Topics Concern  . Not on file  Social History Narrative   She is originally from [place] and graduated high school in [place]. She has been married for approximately 2years and has no children. She has been working for [employer] for the past one month. She currently lives with her husband. She denies any physical or sexual abuse.   Social Determinants of Health   Financial Resource Strain: Not on file  Food Insecurity: Not on file  Transportation Needs: Not on file  Physical Activity: Not on file  Stress: Not on file  Social Connections: Not on file  Intimate Partner Violence: Not on file    Past Medical History, Surgical history, Social history, and Family history were reviewed and updated as appropriate.   Please see review of systems for further details on the patient's review from today.   Objective:   Physical Exam:  There were no  vitals taken for this visit.  Physical Exam  Lab Review:     Component Value Date/Time   NA 141 04/27/2020 1019   NA 139 01/31/2015 1329   K 4.2 04/27/2020 1019   K 3.7 01/31/2015 1329   CL 105 04/27/2020 1019   CL 107 01/31/2015 1329   CO2 24 04/27/2020 1019   CO2 26 01/31/2015 1329   GLUCOSE 125 (H) 04/27/2020 1019   GLUCOSE 85 08/19/2019 1922   GLUCOSE 86 01/31/2015 1329   BUN 11 04/27/2020 1019   BUN 9 01/31/2015 1329   CREATININE 0.80 04/27/2020 1019   CREATININE 0.63 01/31/2015 1329   CALCIUM 9.1 04/27/2020 1019   CALCIUM 8.8 (L) 01/31/2015 1329   PROT 7.2 04/27/2020 1019   PROT 7.6 01/31/2015 1329   ALBUMIN 4.2 04/27/2020 1019   ALBUMIN 3.8 01/31/2015 1329   AST 20 04/27/2020  1019   AST 24 01/31/2015 1329   ALT 25 04/27/2020 1019   ALT 19 01/31/2015 1329   ALKPHOS 95 04/27/2020 1019   ALKPHOS 73 01/31/2015 1329   BILITOT 0.3 04/27/2020 1019   BILITOT 0.7 01/31/2015 1329   GFRNONAA 96 04/27/2020 1019   GFRNONAA >60 01/31/2015 1329   GFRAA 111 04/27/2020 1019   GFRAA >60 01/31/2015 1329       Component Value Date/Time   WBC 9.6 02/21/2020 1119   WBC 17.0 (H) 08/22/2019 0741   RBC 5.10 02/21/2020 1119   RBC 3.56 (L) 08/22/2019 0741   HGB 13.4 02/21/2020 1119   HCT 40.9 02/21/2020 1119   PLT 319 08/22/2019 0741   PLT 314 06/02/2019 0932   MCV 80 02/21/2020 1119   MCV 85 01/31/2015 1329   MCH 26.3 (L) 02/21/2020 1119   MCH 28.4 08/22/2019 0741   MCHC 32.8 02/21/2020 1119   MCHC 32.9 08/22/2019 0741   RDW 13.8 02/21/2020 1119   RDW 13.4 01/31/2015 1329   LYMPHSABS 3.4 (H) 02/21/2020 1119   MONOABS 0.7 02/24/2018 0405   EOSABS 0.2 02/21/2020 1119   BASOSABS 0.1 02/21/2020 1119    No results found for: POCLITH, LITHIUM   No results found for: PHENYTOIN, PHENOBARB, VALPROATE, CBMZ   .res Assessment: Plan:     Plan:  PDMP reviewed  Lamictal 100mg  BID Vraylar 3mg   Buspar 10mg  BID   Add Luvox 50mg  x 7, then 100mg  daily   RTC 4  weeks  Patient advised to contact office with any questions, adverse effects, or acute worsening in signs and symptoms.  Counseled patient regarding potential benefits, risks, and side effects of Lamictal to include potential risk of Stevens-Johnson syndrome. Advised patient to stop taking Lamictal and contact office immediately if rash develops and to seek urgent medical attention if rash is severe and/or spreading quickly.  Discussed potential metabolic side effects associated with atypical antipsychotics, as well as potential risk for movement side effects. Advised pt to contact office if movement side effects occur.     Diagnoses and all orders for this visit:  Insomnia, unspecified type  Generalized anxiety disorder -     busPIRone (BUSPAR) 10 MG tablet; Take 1 tablet (10 mg total) by mouth 2 (two) times daily.  Bipolar I disorder (HCC) -     lamoTRIgine (LAMICTAL) 100 MG tablet; Take 1 tablet (100 mg total) by mouth 2 (two) times daily. -     cariprazine (VRAYLAR) capsule; Take 1 capsule (3 mg total) by mouth daily.  Mixed obsessional thoughts and acts -     fluvoxaMINE (LUVOX) 50 MG tablet; Take one tablet daily x 7 days, then take two tablets daily.  Major depressive disorder, recurrent episode, moderate (HCC)     Please see After Visit Summary for patient specific instructions.  No future appointments.  No orders of the defined types were placed in this encounter.   -------------------------------

## 2020-12-04 ENCOUNTER — Telehealth: Payer: Self-pay

## 2020-12-04 DIAGNOSIS — Z0289 Encounter for other administrative examinations: Secondary | ICD-10-CM

## 2020-12-04 NOTE — Telephone Encounter (Signed)
Noted  

## 2020-12-04 NOTE — Telephone Encounter (Signed)
Received FMLA intermittent renewal 6 months. Forms completed and given to Almira Coaster to review and sign

## 2020-12-27 ENCOUNTER — Other Ambulatory Visit: Payer: Self-pay

## 2020-12-27 ENCOUNTER — Ambulatory Visit (INDEPENDENT_AMBULATORY_CARE_PROVIDER_SITE_OTHER): Payer: 59 | Admitting: Adult Health

## 2020-12-27 ENCOUNTER — Encounter: Payer: Self-pay | Admitting: Adult Health

## 2020-12-27 DIAGNOSIS — G4700 Insomnia, unspecified: Secondary | ICD-10-CM | POA: Diagnosis not present

## 2020-12-27 DIAGNOSIS — F422 Mixed obsessional thoughts and acts: Secondary | ICD-10-CM | POA: Diagnosis not present

## 2020-12-27 DIAGNOSIS — F411 Generalized anxiety disorder: Secondary | ICD-10-CM | POA: Diagnosis not present

## 2020-12-27 DIAGNOSIS — F319 Bipolar disorder, unspecified: Secondary | ICD-10-CM | POA: Diagnosis not present

## 2020-12-27 MED ORDER — FLUVOXAMINE MALEATE 100 MG PO TABS: ORAL_TABLET | ORAL | 1 refills | Status: DC

## 2020-12-27 NOTE — Progress Notes (Signed)
Jill [name] [id number] 03-29-1986 34 y.o.  Subjective:   Patient ID:  Jill [name] is a 35 y.o. (DOB Apr 10, 1986) female.  Chief Complaint: No chief complaint on file.   HPI Jill [name] presents to the office today for follow-up of MDD, BPD1, insomnia, and OCD.  Describes mood today as "better". Pleasant. Flat. Denies tearful. Mood symptoms - reports decreased anxiety - "I am able to drive again - people not talking about me. Feels depressed - "I always fell blah". Denies irritability. Anxiety is "really bad".  Has missed last 2 days of work - "didn't feel like getting up and going in". Denies intrusive thoughts. Denies mania. Denies over spending. Works full time at [employer]. Mother-in-law taking care of son during the day. Stable interest and motivation.Taking medications as prescribed.  Energy levels "still pretty low". Active, doe nsot have a regular exercise routine.  Enjoys some usual interests and activities. Married. Lives with husband of 5 years and 50 months old son. Family local. Sister lives in [place]. Appetite adequate. Weight stable - 269 pounds. Sleeping difficulties, but better. Averages 6 hours. Focus and concentration "better". Completing tasks. Managing some aspects of household. Working 8 hours a day - [employer]. Denies SI or HI.  Denies AH or VH. Not feeling as paranoid - "no one out to get me".  Hospitalized in 2016 for paranoia, substance use, reckless behaviors, and overspending and was started on Geodon but had to stop due to costs.   Previous medication trials: Lamictal, Zoloft, Luvox, Geodon .  GAD-7   Flowsheet Row Office Visit from 02/21/2020 in [facility]  Total GAD-7 Score 7    PHQ2-9   Flowsheet Row Office Visit from 02/21/2020 in [facility]  PHQ-2 Total Score 2  PHQ-9 Total Score 6       Review of Systems:  Review of Systems  Musculoskeletal: Negative for gait problem.  Neurological: Negative for  tremors.  Psychiatric/Behavioral:       Please refer to HPI    Medications: I have reviewed the patient's current medications.  Current Outpatient Medications  Medication Sig Dispense Refill  . busPIRone (BUSPAR) 10 MG tablet Take 1 tablet (10 mg total) by mouth 2 (two) times daily. 180 tablet 3  . cariprazine (VRAYLAR) capsule Take 1 capsule (3 mg total) by mouth daily. 90 capsule 3  . fluvoxaMINE (LUVOX) 100 MG tablet Take one tablet daily. 90 tablet 1  . Fremanezumab-vfrm (AJOVY) 225 MG/1.5ML SOAJ Inject 225 mg into the skin every 30 (thirty) days. 1 pen 11  . lamoTRIgine (LAMICTAL) 100 MG tablet Take 1 tablet (100 mg total) by mouth 2 (two) times daily. 180 tablet 3  . ondansetron (ZOFRAN-ODT) 4 MG disintegrating tablet Take 1 tablet (4 mg total) by mouth every 8 (eight) hours as needed for nausea. 30 tablet 3  . rizatriptan (MAXALT-MLT) 10 MG disintegrating tablet Take 1 tablet (10 mg total) by mouth as needed for migraine. May repeat in 2 hours if needed 9 tablet 11   Current Facility-Administered Medications  Medication Dose Route Frequency Provider Last Rate Last Admin  . Fremanezumab-vfrm SOSY 225 mg  225 mg Subcutaneous Once Anson Fret, MD        Medication Side Effects: None  Allergies:  Allergies  Allergen Reactions  . Amoxicillin Anaphylaxis, Shortness Of Breath and Rash    Childhood reaction  Has patient had a PCN reaction causing immediate rash, facial/tongue/throat swelling, SOB or lightheadedness with hypotension: Yes Has  patient had a PCN reaction causing severe rash involving mucus membranes or skin necrosis: Unknown Has patient had a PCN reaction that required hospitalization: No Has patient had a PCN reaction occurring within the last 10 years: No If all of the above answers are "NO", then may proceed with Cephalosporin use.  Marland Kitchen. Penicillins     Pt does not think that she is allergic to this medication but is not sure     Past Medical History:   Diagnosis Date  . Bipolar 1 disorder (HCC)   . Depression   . Migraine   . OCD (obsessive compulsive disorder)   . TMJ (dislocation of temporomandibular joint)     Family History  Problem Relation Age of Onset  . Depression Mother   . Alcohol abuse Father   . Lung cancer Maternal Grandmother   . Hypertension Maternal Grandmother   . Hypertension Maternal Grandfather   . Stroke Maternal Grandfather   . Hypertension Paternal Grandmother   . Headache Paternal Grandmother   . Hypertension Paternal Grandfather   . Breast cancer Neg Hx     Social History   Socioeconomic History  . Marital status: Married    Spouse name: Alycia RossettiRyan  . Number of children: Not on file  . Years of education: Not on file  . Highest education level: Not on file  Occupational History  . Occupation: Lab assisstant  Tobacco Use  . Smoking status: Never Smoker  . Smokeless tobacco: Never Used  Vaping Use  . Vaping Use: Never used  Substance and Sexual Activity  . Alcohol use: No    Alcohol/week: 0.0 standard drinks  . Drug use: No  . Sexual activity: Yes    Partners: Male    Birth control/protection: None  Other Topics Concern  . Not on file  Social History Narrative   She is originally from [place] and graduated high school in [place]. She has been married for approximately 2years and has no children. She has been working for [employer] for the past one month. She currently lives with her husband. She denies any physical or sexual abuse.   Social Determinants of Health   Financial Resource Strain: Not on file  Food Insecurity: Not on file  Transportation Needs: Not on file  Physical Activity: Not on file  Stress: Not on file  Social Connections: Not on file  Intimate Partner Violence: Not on file    Past Medical History, Surgical history, Social history, and Family history were reviewed and updated as appropriate.   Please see review of systems for further details on the patient's review  from today.   Objective:   Physical Exam:  There were no vitals taken for this visit.  Physical Exam Constitutional:      General: She is not in acute distress. Musculoskeletal:        General: No deformity.  Neurological:     Mental Status: She is alert and oriented to person, place, and time.     Coordination: Coordination normal.  Psychiatric:        Attention and Perception: Attention and perception normal. She does not perceive auditory or visual hallucinations.        Mood and Affect: Mood normal. Mood is not anxious or depressed. Affect is not labile, blunt, angry or inappropriate.        Speech: Speech normal.        Behavior: Behavior normal.        Thought Content: Thought content normal. Thought content  is not paranoid or delusional. Thought content does not include homicidal or suicidal ideation. Thought content does not include homicidal or suicidal plan.        Cognition and Memory: Cognition and memory normal.        Judgment: Judgment normal.     Comments: Insight intact     Lab Review:     Component Value Date/Time   NA 141 04/27/2020 1019   NA 139 01/31/2015 1329   K 4.2 04/27/2020 1019   K 3.7 01/31/2015 1329   CL 105 04/27/2020 1019   CL 107 01/31/2015 1329   CO2 24 04/27/2020 1019   CO2 26 01/31/2015 1329   GLUCOSE 125 (H) 04/27/2020 1019   GLUCOSE 85 08/19/2019 1922   GLUCOSE 86 01/31/2015 1329   BUN 11 04/27/2020 1019   BUN 9 01/31/2015 1329   CREATININE 0.80 04/27/2020 1019   CREATININE 0.63 01/31/2015 1329   CALCIUM 9.1 04/27/2020 1019   CALCIUM 8.8 (L) 01/31/2015 1329   PROT 7.2 04/27/2020 1019   PROT 7.6 01/31/2015 1329   ALBUMIN 4.2 04/27/2020 1019   ALBUMIN 3.8 01/31/2015 1329   AST 20 04/27/2020 1019   AST 24 01/31/2015 1329   ALT 25 04/27/2020 1019   ALT 19 01/31/2015 1329   ALKPHOS 95 04/27/2020 1019   ALKPHOS 73 01/31/2015 1329   BILITOT 0.3 04/27/2020 1019   BILITOT 0.7 01/31/2015 1329   GFRNONAA 96 04/27/2020 1019    GFRNONAA >60 01/31/2015 1329   GFRAA 111 04/27/2020 1019   GFRAA >60 01/31/2015 1329       Component Value Date/Time   WBC 9.6 02/21/2020 1119   WBC 17.0 (H) 08/22/2019 0741   RBC 5.10 02/21/2020 1119   RBC 3.56 (L) 08/22/2019 0741   HGB 13.4 02/21/2020 1119   HCT 40.9 02/21/2020 1119   PLT 319 08/22/2019 0741   PLT 314 06/02/2019 0932   MCV 80 02/21/2020 1119   MCV 85 01/31/2015 1329   MCH 26.3 (L) 02/21/2020 1119   MCH 28.4 08/22/2019 0741   MCHC 32.8 02/21/2020 1119   MCHC 32.9 08/22/2019 0741   RDW 13.8 02/21/2020 1119   RDW 13.4 01/31/2015 1329   LYMPHSABS 3.4 (H) 02/21/2020 1119   MONOABS 0.7 02/24/2018 0405   EOSABS 0.2 02/21/2020 1119   BASOSABS 0.1 02/21/2020 1119    No results found for: POCLITH, LITHIUM   No results found for: PHENYTOIN, PHENOBARB, VALPROATE, CBMZ   .res Assessment: Plan:     Plan:  PDMP reviewed  Lamictal 100mg  BID Vraylar 3mg   Buspar 10mg  BID   Luvox 100mg  daily  RTC 4/6 weeks  Patient advised to contact office with any questions, adverse effects, or acute worsening in signs and symptoms.  Counseled patient regarding potential benefits, risks, and side effects of Lamictal to include potential risk of Stevens-Johnson syndrome. Advised patient to stop taking Lamictal and contact office immediately if rash develops and to seek urgent medical attention if rash is severe and/or spreading quickly.  Discussed potential metabolic side effects associated with atypical antipsychotics, as well as potential risk for movement side effects. Advised pt to contact office if movement side effects occur.    Diagnoses and all orders for this visit:  Generalized anxiety disorder  Mixed obsessional thoughts and acts -     fluvoxaMINE (LUVOX) 100 MG tablet; Take one tablet daily.  Bipolar I disorder (HCC)  Insomnia, unspecified type     Please see After Visit Summary for patient specific instructions.  No future appointments.  No orders  of the defined types were placed in this encounter.   -------------------------------

## 2021-01-28 ENCOUNTER — Encounter: Payer: Self-pay | Admitting: Obstetrics and Gynecology

## 2021-02-07 ENCOUNTER — Ambulatory Visit: Payer: 59 | Admitting: Adult Health

## 2021-02-26 ENCOUNTER — Ambulatory Visit: Payer: 59 | Admitting: Adult Health

## 2021-04-29 ENCOUNTER — Other Ambulatory Visit: Payer: Self-pay | Admitting: Neurology

## 2021-05-02 ENCOUNTER — Ambulatory Visit: Payer: 59 | Admitting: Adult Health

## 2021-05-22 ENCOUNTER — Ambulatory Visit: Payer: 59 | Admitting: Adult Health

## 2021-06-04 ENCOUNTER — Ambulatory Visit: Payer: 59 | Admitting: Adult Health

## 2021-06-12 ENCOUNTER — Telehealth: Payer: Self-pay | Admitting: Adult Health

## 2021-06-12 NOTE — Telephone Encounter (Signed)
Faxed FMLA paperwork to [email or web address].

## 2021-06-18 ENCOUNTER — Ambulatory Visit (INDEPENDENT_AMBULATORY_CARE_PROVIDER_SITE_OTHER): Payer: 59 | Admitting: Adult Health

## 2021-06-18 ENCOUNTER — Other Ambulatory Visit: Payer: Self-pay

## 2021-06-18 ENCOUNTER — Encounter: Payer: Self-pay | Admitting: Adult Health

## 2021-06-18 DIAGNOSIS — G4700 Insomnia, unspecified: Secondary | ICD-10-CM | POA: Diagnosis not present

## 2021-06-18 DIAGNOSIS — F422 Mixed obsessional thoughts and acts: Secondary | ICD-10-CM | POA: Diagnosis not present

## 2021-06-18 DIAGNOSIS — F411 Generalized anxiety disorder: Secondary | ICD-10-CM | POA: Diagnosis not present

## 2021-06-18 DIAGNOSIS — F331 Major depressive disorder, recurrent, moderate: Secondary | ICD-10-CM | POA: Diagnosis not present

## 2021-06-18 DIAGNOSIS — F319 Bipolar disorder, unspecified: Secondary | ICD-10-CM

## 2021-06-18 MED ORDER — FLUVOXAMINE MALEATE 100 MG PO TABS: ORAL_TABLET | ORAL | 3 refills | Status: DC

## 2021-06-18 NOTE — Progress Notes (Signed)
Jill [name] [id number] 07-31-1986 34 y.o.  Subjective:   Patient ID:  Jill [name] is a 35 y.o. (DOB Nov 14, 1985) female.  Chief Complaint: No chief complaint on file.   HPI Jill [name] presents to the office today for follow-up of MDD, BPD1, insomnia, and OCD.  Describes mood today as "ok". Pleasant. Flat. Denies tearfulness - "here and there". Mood symptoms - reports anxiety and depression - "a lot of the time". Stating "my anxiety is really bad". Increased stress in work setting. Increased worry and rumination of things that "could" happen. Denies irritability. Denies intrusive thoughts. Denies mania. Denies over spending. Works full time at [employer]. Mother-in-law taking care of son during the day. Stable interest and motivation.Taking medications as prescribed.  Energy levels "kinda low". Active, doe nsot have a regular exercise routine.  Enjoys some usual interests and activities. Married. Lives with husband and 59 months old son. Family local. Sister lives in [place]. Appetite adequate. Weight stable - 269 pounds. Sleeping difficulties, but better. Averages 6 hours. Focus and concentration good. Completing tasks. Managing some aspects of household. Working 8 hours a day - [employer]. Denies SI or HI.  Denies AH or VH. Denies paranoia.  Hospitalized in 2016 for paranoia, substance use, reckless behaviors, and overspending and was started on Geodon but had to stop due to costs.   Previous medication trials: Lamictal, Zoloft, Luvox, Geodon .   GAD-7    Flowsheet Row Office Visit from 02/21/2020 in [facility]  Total GAD-7 Score 7      PHQ2-9    Flowsheet Row Office Visit from 02/21/2020 in [facility]  PHQ-2 Total Score 2  PHQ-9 Total Score 6        Review of Systems:  Review of Systems  Musculoskeletal:  Negative for gait problem.  Neurological:  Negative for tremors.  Psychiatric/Behavioral:         Please refer to HPI    Medications: I have reviewed the patient's current medications.  Current Outpatient Medications  Medication Sig Dispense Refill   AJOVY 225 MG/1.5ML SOAJ INJECT 225 MG UNDER THE SKIN EVERY 30 DAYS 1.5 mL 6   busPIRone (BUSPAR) 10 MG tablet Take 1 tablet (10 mg total) by mouth 2 (two) times daily. 180 tablet 3   cariprazine (VRAYLAR) capsule Take 1 capsule (3 mg total) by mouth daily. 90 capsule 3   fluvoxaMINE (LUVOX) 100 MG tablet Take one tablet daily. 90 tablet 3   lamoTRIgine (LAMICTAL) 100 MG tablet Take 1 tablet (100 mg total) by mouth 2 (two) times daily. 180 tablet 3   ondansetron (ZOFRAN-ODT) 4 MG disintegrating tablet Take 1 tablet (4 mg total) by mouth every 8 (eight) hours as needed for nausea. 30 tablet 3   rizatriptan (MAXALT-MLT) 10 MG disintegrating tablet Take 1 tablet (10 mg total) by mouth as needed for migraine. May repeat in 2 hours if needed 9 tablet 11   Current Facility-Administered Medications  Medication Dose Route Frequency Provider Last Rate Last Admin   Fremanezumab-vfrm SOSY 225 mg  225 mg Subcutaneous Once Anson Fret, MD        Medication Side Effects: None  Allergies:  Allergies  Allergen Reactions   Amoxicillin Anaphylaxis, Shortness Of Breath and Rash    Childhood reaction  Has patient had a PCN reaction causing immediate rash, facial/tongue/throat swelling, SOB or lightheadedness with hypotension: Yes Has patient had a PCN reaction causing severe rash involving mucus membranes or skin necrosis: Unknown Has  patient had a PCN reaction that required hospitalization: No Has patient had a PCN reaction occurring within the last 10 years: No If all of the above answers are "NO", then may proceed with Cephalosporin use.   Penicillins     Pt does not think that she is allergic to this medication but is not sure     Past Medical History:  Diagnosis Date   Bipolar 1 disorder (HCC)    Depression    Migraine    OCD (obsessive compulsive disorder)     TMJ (dislocation of temporomandibular joint)     Past Medical History, Surgical history, Social history, and Family history were reviewed and updated as appropriate.   Please see review of systems for further details on the patient's review from today.   Objective:   Physical Exam:  There were no vitals taken for this visit.  Physical Exam Constitutional:      General: She is not in acute distress. Musculoskeletal:        General: No deformity.  Neurological:     Mental Status: She is alert and oriented to person, place, and time.     Coordination: Coordination normal.  Psychiatric:        Attention and Perception: Attention and perception normal. She does not perceive auditory or visual hallucinations.        Mood and Affect: Mood normal. Mood is not anxious or depressed. Affect is not labile, blunt, angry or inappropriate.        Speech: Speech normal.        Behavior: Behavior normal.        Thought Content: Thought content normal. Thought content is not paranoid or delusional. Thought content does not include homicidal or suicidal ideation. Thought content does not include homicidal or suicidal plan.        Cognition and Memory: Cognition and memory normal.        Judgment: Judgment normal.     Comments: Insight intact    Lab Review:     Component Value Date/Time   NA 141 04/27/2020 1019   NA 139 01/31/2015 1329   K 4.2 04/27/2020 1019   K 3.7 01/31/2015 1329   CL 105 04/27/2020 1019   CL 107 01/31/2015 1329   CO2 24 04/27/2020 1019   CO2 26 01/31/2015 1329   GLUCOSE 125 (H) 04/27/2020 1019   GLUCOSE 85 08/19/2019 1922   GLUCOSE 86 01/31/2015 1329   BUN 11 04/27/2020 1019   BUN 9 01/31/2015 1329   CREATININE 0.80 04/27/2020 1019   CREATININE 0.63 01/31/2015 1329   CALCIUM 9.1 04/27/2020 1019   CALCIUM 8.8 (L) 01/31/2015 1329   PROT 7.2 04/27/2020 1019   PROT 7.6 01/31/2015 1329   ALBUMIN 4.2 04/27/2020 1019   ALBUMIN 3.8 01/31/2015 1329   AST 20  04/27/2020 1019   AST 24 01/31/2015 1329   ALT 25 04/27/2020 1019   ALT 19 01/31/2015 1329   ALKPHOS 95 04/27/2020 1019   ALKPHOS 73 01/31/2015 1329   BILITOT 0.3 04/27/2020 1019   BILITOT 0.7 01/31/2015 1329   GFRNONAA 96 04/27/2020 1019   GFRNONAA >60 01/31/2015 1329   GFRAA 111 04/27/2020 1019   GFRAA >60 01/31/2015 1329       Component Value Date/Time   WBC 9.6 02/21/2020 1119   WBC 17.0 (H) 08/22/2019 0741   RBC 5.10 02/21/2020 1119   RBC 3.56 (L) 08/22/2019 0741   HGB 13.4 02/21/2020 1119   HCT 40.9 02/21/2020 1119  PLT 319 08/22/2019 0741   PLT 314 06/02/2019 0932   MCV 80 02/21/2020 1119   MCV 85 01/31/2015 1329   MCH 26.3 (L) 02/21/2020 1119   MCH 28.4 08/22/2019 0741   MCHC 32.8 02/21/2020 1119   MCHC 32.9 08/22/2019 0741   RDW 13.8 02/21/2020 1119   RDW 13.4 01/31/2015 1329   LYMPHSABS 3.4 (H) 02/21/2020 1119   MONOABS 0.7 02/24/2018 0405   EOSABS 0.2 02/21/2020 1119   BASOSABS 0.1 02/21/2020 1119    No results found for: POCLITH, LITHIUM   No results found for: PHENYTOIN, PHENOBARB, VALPROATE, CBMZ   .res Assessment: Plan:    Plan:  PDMP reviewed  Lamictal 100mg  BID Vraylar 3mg   Buspar 10mg  BID   Luvox 100mg  daily  RTC 3 months  Patient advised to contact office with any questions, adverse effects, or acute worsening in signs and symptoms.  Counseled patient regarding potential benefits, risks, and side effects of Lamictal to include potential risk of Stevens-Johnson syndrome. Advised patient to stop taking Lamictal and contact office immediately if rash develops and to seek urgent medical attention if rash is severe and/or spreading quickly.  Discussed potential metabolic side effects associated with atypical antipsychotics, as well as potential risk for movement side effects. Advised pt to contact office if movement side effects occur.  Diagnoses and all orders for this visit:  Generalized anxiety disorder  Mixed obsessional thoughts and  acts -     fluvoxaMINE (LUVOX) 100 MG tablet; Take one tablet daily.  Insomnia, unspecified type  Major depressive disorder, recurrent episode, moderate (HCC)  Bipolar 1 disorder (HCC)    Please see After Visit Summary for patient specific instructions.  Future Appointments  Date Time Provider Department Center  07/19/2021  2:00 PM , MD [facility] None  09/18/2021  9:00 AM Rheannon Cerney, , NP [facility] None    No orders of the defined types were placed in this encounter.   -------------------------------

## 2021-06-21 ENCOUNTER — Telehealth: Payer: Self-pay | Admitting: Adult Health

## 2021-06-21 NOTE — Telephone Encounter (Signed)
Marylu Lund at the [employer] [phone] called regarding pt.  She needs all the places where information was altered to be dated and initialed and then faxed back to them at [phone].

## 2021-06-25 NOTE — Telephone Encounter (Signed)
Sorry it was an Transport planner form.  Marylu Lund at [facility] [employer] called back today

## 2021-06-25 NOTE — Telephone Encounter (Signed)
Let me investigate. thanks

## 2021-06-25 NOTE — Telephone Encounter (Signed)
Do you know what form they are referring to?

## 2021-06-27 NOTE — Telephone Encounter (Signed)
Form was updated with corrections and refaxed

## 2021-07-02 ENCOUNTER — Ambulatory Visit: Payer: 59 | Admitting: Adult Health

## 2021-07-19 ENCOUNTER — Ambulatory Visit: Payer: Managed Care, Other (non HMO) | Admitting: Neurology

## 2021-07-19 ENCOUNTER — Encounter: Payer: Self-pay | Admitting: Neurology

## 2021-07-19 VITALS — BP 111/78 | HR 78 | Ht 69.0 in | Wt 262.8 lb

## 2021-07-19 DIAGNOSIS — G43709 Chronic migraine without aura, not intractable, without status migrainosus: Secondary | ICD-10-CM

## 2021-07-19 DIAGNOSIS — G43109 Migraine with aura, not intractable, without status migrainosus: Secondary | ICD-10-CM | POA: Diagnosis not present

## 2021-07-19 NOTE — Progress Notes (Signed)
[facility]    Provider:  Dr Lucia Gaskins Requesting Provider: Adelene Idler MD Primary Care Provider:  Estell Harpin, MD  CC:  headaches  07/22/2021: Jill [name] went up to $125 copay, gave 6 samples and will try savings card again in February. If she has any problems getting ajovy next year, have advised to wait until February and sign up with the new card, to let us know if she needs anymore samples or we can look into what's going with her insurance.  HPI:  Jill [name] is a 35 y.o. female here as requested by Adelene Idler MD for migraines. PMHx bipolar. She developed migraines in her teens and diagnosed in her 16s, paternal grandmother may have had migraines. Her migraine come on with spots in both eyes mainly the right, she has vision loss in the right eye or like she is looking through cellophane, nothing she says makes sense, she can have this even without the head pain but she can have a headache about 30 minutes afterwards like her "head is going to explode" its horrible, usually on the right, pulsating and pounding and throbbing radiates to the neck, centered behind the right eye, light and sound sensitivity, nausea na vomiting, movement makes it worse, excedrin may help a little she lays in her room in the dark, feeling hot can trigger a migraine, stress, weather, period can make it worse. She has migraines 2-3x a month and can last 2 days. She has daily headaches, 5 migraine days a month. The other headaches are dull around the temples and forehead. She states these are getting worse and the loss of vision in the right eye is new, she can wake with the headaches or she will wake up with headache and vomiting, it is worse positionally when she stands it is worse or if she moves at all it hurts she can't even roll over. No other focal neurologic deficits, associated symptoms, inciting events or modifiable factors.  Reviewed notes, labs and imaging from outside  physicians, which showed:  TSH normal, cbc unremarkable  meds tried: magnesium, reglan injection, tylenol, fioricet, decadron injection, benadry po,ibuprofen, lamictal, zofran, phenergan, trazodone  MRI 2014: showed No acute intracranial abnormalities including mass lesion or mass effect, hydrocephalus, extra-axial fluid collection, midline shift, hemorrhage, or acute infarction, large ischemic events (personally reviewed images)     Review of Systems: Patient complains of symptoms per HPI as well as the following symptoms: headache . Pertinent negatives and positives per HPI. All others negative    Social History   Socioeconomic History   Marital status: Married    Spouse name: [name]   Number of children: Not on file   Years of education: Not on file   Highest education level: Not on file  Occupational History   Occupation: Lab assisstant  Tobacco Use   Smoking status: Never   Smokeless tobacco: Never  Vaping Use   Vaping Use: Never used  Substance and Sexual Activity   Alcohol use: No    Alcohol/week: 0.0 standard drinks   Drug use: No   Sexual activity: Yes    Partners: Male    Birth control/protection: None  Other Topics Concern   Not on file  Social History Narrative   She is originally from [place] and graduated high school in [place]. She has been married for approximately 2years and has no children. She has been working for [employer] for the past one month. She currently lives with her husband. She denies any  physical or sexual abuse.   Social Determinants of Health   Financial Resource Strain: Not on file  Food Insecurity: Not on file  Transportation Needs: Not on file  Physical Activity: Not on file  Stress: Not on file  Social Connections: Not on file  Intimate Partner Violence: Not on file    Family History  Problem Relation Age of Onset   Depression Mother    Alcohol abuse Father    Lung cancer Maternal Grandmother    Hypertension Maternal  Grandmother    Hypertension Maternal Grandfather    Stroke Maternal Grandfather    Hypertension Paternal Grandmother    Headache Paternal Grandmother    Migraines Paternal Grandmother    Hypertension Paternal Grandfather    Breast cancer Neg Hx     Past Medical History:  Diagnosis Date   Bipolar 1 disorder (HCC)    Depression    Migraine    OCD (obsessive compulsive disorder)    TMJ (dislocation of temporomandibular joint)     Patient Active Problem List   Diagnosis Date Noted   Chronic migraine without aura without status migrainosus, not intractable 07/22/2021   Migraine with aura and without status migrainosus, not intractable 04/27/2020   Status post cesarean delivery 08/21/2019   Bilateral lower extremity edema 08/19/2019   Severe preeclampsia 08/19/2019   Incomplete abortion 03/03/2018   Bipolar 1 disorder (HCC) 02/23/2018   Supervision of high risk pregnancy, antepartum 01/13/2018   BMI 40.0-44.9, adult (HCC) 01/13/2018   Endometriosis determined by laparoscopy 03/31/2017   Pelvic pain in female 02/20/2017   Dyspareunia in female 02/01/2017   Menorrhagia with regular cycle 02/01/2017   Dysmenorrhea 02/01/2017   Major depressive disorder, recurrent, severe without psychotic features (HCC)    OCD (obsessive compulsive disorder) 02/05/2015   Insomnia due to mental disorder 02/05/2015   Bipolar 2 disorder, major depressive episode (HCC) 02/04/2015    Past Surgical History:  Procedure Laterality Date   CESAREAN SECTION N/A 08/21/2019   Procedure: CESAREAN SECTION;  Surgeon: Conard Novak, MD;  Location: [facility] ORS;  Service: Obstetrics;  Laterality: N/A;   DILATION AND EVACUATION N/A 03/03/2018   Procedure: DILATATION AND EVACUATION;  Surgeon: Nadara Mustard, MD;  Location: [facility] ORS;  Service: Gynecology;  Laterality: N/A;   LAPAROSCOPIC LYSIS OF ADHESIONS  03/21/2017   Procedure: LAPAROSCOPIC LYSIS OF ADHESIONS;  Surgeon: Conard Novak, MD;  Location: [facility]  ORS;  Service: Gynecology;;   LAPAROSCOPY N/A 03/21/2017   Procedure: LAPAROSCOPY DIAGNOSTIC/FULGERATION OF ENDOMETRIAL IMPLANTS;  Surgeon: Conard Novak, MD;  Location: [facility] ORS;  Service: Gynecology;  Laterality: N/A;   None      Current Outpatient Medications  Medication Sig Dispense Refill   AJOVY 225 MG/1.5ML SOAJ INJECT 225 MG UNDER THE SKIN EVERY 30 DAYS 1.5 mL 6   busPIRone (BUSPAR) 10 MG tablet Take 1 tablet (10 mg total) by mouth 2 (two) times daily. 180 tablet 3   cariprazine (VRAYLAR) capsule Take 1 capsule (3 mg total) by mouth daily. 90 capsule 3   fluvoxaMINE (LUVOX) 100 MG tablet Take one tablet daily. 90 tablet 3   Fremanezumab-vfrm (AJOVY) 225 MG/1.5ML SOAJ Inject 225 mg into the skin every 30 (thirty) days. 6 mL 0   lamoTRIgine (LAMICTAL) 100 MG tablet Take 1 tablet (100 mg total) by mouth 2 (two) times daily. 180 tablet 3   ondansetron (ZOFRAN-ODT) 4 MG disintegrating tablet Take 1 tablet (4 mg total) by mouth every 8 (eight) hours as needed for nausea.  30 tablet 3   rizatriptan (MAXALT-MLT) 10 MG disintegrating tablet Take 1 tablet (10 mg total) by mouth as needed for migraine. May repeat in 2 hours if needed 9 tablet 11   Current Facility-Administered Medications  Medication Dose Route Frequency Provider Last Rate Last Admin   Fremanezumab-vfrm SOSY 225 mg  225 mg Subcutaneous Once Anson Fret, MD        Allergies as of 07/19/2021 - Review Complete 07/19/2021  Allergen Reaction Noted   Amoxicillin Anaphylaxis, Shortness Of Breath, and Rash 08/09/2013   Penicillins  08/09/2013    Vitals: BP 111/78   Pulse 78   Ht 5\' 9"  (1.753 m)   Wt 262 lb 12.8 oz (119.2 kg)   BMI 38.81 kg/m  Last Weight:  Wt Readings from Last 1 Encounters:  07/19/21 262 lb 12.8 oz (119.2 kg)   Last Height:   Ht Readings from Last 1 Encounters:  07/19/21 5\' 9"  (1.753 m)   Exam: NAD, pleasant                  Speech:    Speech is normal; fluent and spontaneous with normal  comprehension.  Cognition:    The patient is oriented to person, place, and time;     recent and remote memory intact;     language fluent;    Cranial Nerves:    The pupils are equal, round, and reactive to light.Trigeminal sensation is intact and the muscles of mastication are normal. The face is symmetric. The palate elevates in the midline. Hearing intact. Voice is normal. Shoulder shrug is normal. The tongue has normal motion without fasciculations.   Coordination:  No dysmetria  Motor Observation:    No asymmetry, no atrophy, and no involuntary movements noted. Tone:    Normal muscle tone.     Strength:    Strength is V/V in the upper and lower limbs.      Sensation: intact to LT      Assessment/Plan:  35 y.o. female here as requested by MD for migraines. PMHx bipolar. She developed migraines in her teens and diagnosed in her 64s, paternal grandmother may have had migraines. She has had MRI brain and ophthalmology exams.   Loves Ajovy went up to $125 copay, gave 6 samples and will try savings card again in February. If she has any problems getting ajovy next year, have advised to wait until February and sign up with the new card, to let March know if she needs anymore samples or we can look into what's going with her insurance.  Acute management: continue Maxalt(Rizatriptan) and Zofran(Ondansetron): Please take one tablet at the onset of your headache. If it does not improve the symptoms please take one additional tablet. Do not take more then 2 tablets in 24hrs. Do not take use more then 2 to 3 times in a week.  Preventative: continue Ajovy  Discussed increased risk of stroke in women with migraine with aura  Discussed: To prevent or relieve headaches, try the following: Cool Compress. Lie down and place a cool compress on your head.  Avoid headache triggers. If certain foods or odors seem to have triggered your migraines in the past, avoid them. A headache  diary might help you identify triggers.  Include physical activity in your daily routine. Try a daily walk or other moderate aerobic exercise.  Manage stress. Find healthy ways to cope with the stressors, such as delegating tasks on your to-do list.  Practice relaxation  techniques. Try deep breathing, yoga, massage and visualization.  Eat regularly. Eating regularly scheduled meals and maintaining a healthy diet might help prevent headaches. Also, drink plenty of fluids.  Follow a regular sleep schedule. Sleep deprivation might contribute to headaches Consider biofeedback. With this mind-body technique, you learn to control certain bodily functions -- such as muscle tension, heart rate and blood pressure -- to prevent headaches or reduce headache pain.    Proceed to emergency room if you experience new or worsening symptoms or symptoms do not resolve, if you have new neurologic symptoms or if headache is severe, or for any concerning symptom.   Provided education and documentation from American headache Society toolbox including articles on: chronic migraine medication overuse headache, chronic migraines, prevention of migraines, behavioral and other nonpharmacologic treatments for headache.  Discussed: There is increased risk for stroke in women with migraine with aura and a contraindication for the combined contraceptive pill for use by women who have migraine with aura. The risk for women with migraine without aura is lower. However other risk factors like smoking are far more likely to increase stroke risk than migraine. There is a recommendation for no smoking and for the use of OCPs without estrogen such as progestogen only pills particularly for women with migraine with aura.Marland Kitchen People who have migraine headaches with auras may be 3 times more likely to have a stroke caused by a blood clot, compared to migraine patients who don't see auras. Women who take hormone-replacement therapy may be 30  percent more likely to suffer a clot-based stroke than women not taking medication containing estrogen. Other risk factors like smoking and high blood pressure may be  much more important.    No orders of the defined types were placed in this encounter.  Meds ordered this encounter  Medications   Fremanezumab-vfrm (AJOVY) 225 MG/1.5ML SOAJ    Sig: Inject 225 mg into the skin every 30 (thirty) days.    Dispense:  6 mL    Refill:  0    Patient has copay card; she can have medication regardless of insurance approval or copay amount.     Cc: Estell Harpin, MD,  Adelene Idler MD  Naomie Dean, MD  [facility] [address] [place], [place] [address]  Phone [phone] Fax [phone]  I spent 20 minutes of face-to-face and non-face-to-face time with patient on the  1. Chronic migraine without aura without status migrainosus, not intractable   2. Migraine with aura and without status migrainosus, not intractable    diagnosis.  This included previsit chart review, lab review, study review, order entry, electronic health record documentation, patient education on the different diagnostic and therapeutic options, counseling and coordination of care, risks and benefits of management, compliance, or risk factor reduction

## 2021-07-22 DIAGNOSIS — G43709 Chronic migraine without aura, not intractable, without status migrainosus: Secondary | ICD-10-CM | POA: Insufficient documentation

## 2021-07-22 MED ORDER — AJOVY 225 MG/1.5ML ~~LOC~~ SOAJ: 225.0000 mg | SUBCUTANEOUS | 0 refills | Status: DC

## 2021-09-18 ENCOUNTER — Ambulatory Visit: Payer: 59 | Admitting: Adult Health

## 2021-09-18 NOTE — Progress Notes (Signed)
Patient no show appointment. ? ?

## 2021-12-18 ENCOUNTER — Ambulatory Visit (INDEPENDENT_AMBULATORY_CARE_PROVIDER_SITE_OTHER): Payer: 59 | Admitting: Adult Health

## 2021-12-18 ENCOUNTER — Other Ambulatory Visit: Payer: Self-pay

## 2021-12-18 ENCOUNTER — Encounter: Payer: Self-pay | Admitting: Adult Health

## 2021-12-18 DIAGNOSIS — G4700 Insomnia, unspecified: Secondary | ICD-10-CM | POA: Diagnosis not present

## 2021-12-18 DIAGNOSIS — F411 Generalized anxiety disorder: Secondary | ICD-10-CM | POA: Diagnosis not present

## 2021-12-18 DIAGNOSIS — F422 Mixed obsessional thoughts and acts: Secondary | ICD-10-CM

## 2021-12-18 DIAGNOSIS — F319 Bipolar disorder, unspecified: Secondary | ICD-10-CM | POA: Diagnosis not present

## 2021-12-18 MED ORDER — FLUVOXAMINE MALEATE 100 MG PO TABS: ORAL_TABLET | ORAL | 3 refills | Status: DC

## 2021-12-18 MED ORDER — CARIPRAZINE HCL 3 MG PO CAPS: 3.0000 mg | ORAL_CAPSULE | Freq: Every day | ORAL | 3 refills | Status: DC

## 2021-12-18 MED ORDER — LAMOTRIGINE 100 MG PO TABS: 100.0000 mg | ORAL_TABLET | Freq: Two times a day (BID) | ORAL | 3 refills | Status: DC

## 2021-12-18 MED ORDER — BUSPIRONE HCL 10 MG PO TABS: 10.0000 mg | ORAL_TABLET | Freq: Two times a day (BID) | ORAL | 3 refills | Status: DC

## 2021-12-18 NOTE — Progress Notes (Signed)
Virgina Deakins ?[id number] ?12-10-1985 ?36 y.o. ? ?Subjective:  ? ?Patient ID:  Jill [name] is a 36 y.o. (DOB 1986-08-20) female. ? ?Chief Complaint: No chief complaint on file. ? ? ?HPI ?Jill [name] presents to the office today for follow-up of MDD, BPD1, insomnia, and OCD. ? ?Describes mood today as "ok". Pleasant. Flat. Denies tearfulness - "none recently". Mood symptoms - reports some anxiety and depression. Decreased worry and rumination. Denies mania. Denies over spending. Feels irritable since running out of the Vraylar - a week ago. Stating "I'm doing alright". Works full time at [employer]. Family doing well. Stable interest and motivation.Taking medications as prescribed.  ?Enery levels "kinda low". Active, doe nsot have a regular exercise routine.  ?Enjoys some usual interests and activities. Married. Lives with husband and 64 months old son. Family local. Sister lives in [place]. ?Appetite adequate. Weight loss - 12 pounds - 257 from 26  pounds. ?Sleeping well most nights. Averages 6 hours during the week and longer on the weekends. ?Focus and concentration good. Completing tasks. Managing some aspects of household. Working 8 hours a day - [employer]. ?Denies SI or HI.  ?Denies AH or VH. ?Denies paranoia. ? ?Hospitalized in 2016 for paranoia, substance use, reckless behaviors, and overspending and was started on Geodon but had to stop due to costs.  ? ?Previous medication trials: Lamictal, Zoloft, Luvox, Geodon ? ? ?GAD-7   ? ?Flowsheet Row Office Visit from 02/21/2020 in [facility]  ?Total GAD-7 Score 7  ? ?  ? ?PHQ2-9   ? ?Flowsheet Row Office Visit from 02/21/2020 in [facility]  ?PHQ-2 Total Score 2  ?PHQ-9 Total Score 6  ? ?  ?  ? ?Review of Systems:  ?Review of Systems  ?Musculoskeletal:  Negative for gait problem.  ?Neurological:  Negative for tremors.  ?Psychiatric/Behavioral:    ?     Please refer to HPI  ? ?Medications: I have reviewed the patient's current  medications. ? ?Current Outpatient Medications  ?Medication Sig Dispense Refill  ? AJOVY 225 MG/1.5ML SOAJ INJECT 225 MG UNDER THE SKIN EVERY 30 DAYS 1.5 mL 6  ? busPIRone (BUSPAR) 10 MG tablet Take 1 tablet (10 mg total) by mouth 2 (two) times daily. 180 tablet 3  ? cariprazine (VRAYLAR) 3 MG capsule Take 1 capsule (3 mg total) by mouth daily. 90 capsule 3  ? fluvoxaMINE (LUVOX) 100 MG tablet Take one tablet daily. 90 tablet 3  ? Fremanezumab-vfrm (AJOVY) 225 MG/1.5ML SOAJ Inject 225 mg into the skin every 30 (thirty) days. 6 mL 0  ? lamoTRIgine (LAMICTAL) 100 MG tablet Take 1 tablet (100 mg total) by mouth 2 (two) times daily. 180 tablet 3  ? ondansetron (ZOFRAN-ODT) 4 MG disintegrating tablet Take 1 tablet (4 mg total) by mouth every 8 (eight) hours as needed for nausea. 30 tablet 3  ? rizatriptan (MAXALT-MLT) 10 MG disintegrating tablet Take 1 tablet (10 mg total) by mouth as needed for migraine. May repeat in 2 hours if needed 9 tablet 11  ? ?Current Facility-Administered Medications  ?Medication Dose Route Frequency Provider Last Rate Last Admin  ? Fremanezumab-vfrm SOSY 225 mg  225 mg Subcutaneous Once Anson Fret, MD      ? ? ?Medication Side Effects: None ? ?Allergies:  ?Allergies  ?Allergen Reactions  ? Amoxicillin Anaphylaxis, Shortness Of Breath and Rash  ?  Childhood reaction  ?Has patient had a PCN reaction causing immediate rash, facial/tongue/throat swelling, SOB or lightheadedness with  hypotension: Yes ?Has patient had a PCN reaction causing severe rash involving mucus membranes or skin necrosis: Unknown ?Has patient had a PCN reaction that required hospitalization: No ?Has patient had a PCN reaction occurring within the last 10 years: No ?If all of the above answers are "NO", then may proceed with Cephalosporin use.  ? Penicillins   ?  Pt does not think that she is allergic to this medication but is not sure   ? ? ?Past Medical History:  ?Diagnosis Date  ? Bipolar 1 disorder (HCC)   ?  Depression   ? Migraine   ? OCD (obsessive compulsive disorder)   ? TMJ (dislocation of temporomandibular joint)   ? ? ?Past Medical History, Surgical history, Social history, and Family history were reviewed and updated as appropriate.  ? ?Please see review of systems for further details on the patient's review from today.  ? ?Objective:  ? ?Physical Exam:  ?There were no vitals taken for this visit. ? ?Physical Exam ?Constitutional:   ?   General: She is not in acute distress. ?Musculoskeletal:     ?   General: No deformity.  ?Neurological:  ?   Mental Status: She is alert and oriented to person, place, and time.  ?   Coordination: Coordination normal.  ?Psychiatric:     ?   Attention and Perception: Attention and perception normal. She does not perceive auditory or visual hallucinations.     ?   Mood and Affect: Mood normal. Mood is not anxious or depressed. Affect is not labile, blunt, angry or inappropriate.     ?   Speech: Speech normal.     ?   Behavior: Behavior normal.     ?   Thought Content: Thought content normal. Thought content is not paranoid or delusional. Thought content does not include homicidal or suicidal ideation. Thought content does not include homicidal or suicidal plan.     ?   Cognition and Memory: Cognition and memory normal.     ?   Judgment: Judgment normal.  ?   Comments: Insight intact  ? ? ?Lab Review:  ?   ?Component Value Date/Time  ? NA 141 04/27/2020 1019  ? NA 139 01/31/2015 1329  ? K 4.2 04/27/2020 1019  ? K 3.7 01/31/2015 1329  ? CL 105 04/27/2020 1019  ? CL 107 01/31/2015 1329  ? CO2 24 04/27/2020 1019  ? CO2 26 01/31/2015 1329  ? GLUCOSE 125 (H) 04/27/2020 1019  ? GLUCOSE 85 08/19/2019 1922  ? GLUCOSE 86 01/31/2015 1329  ? BUN 11 04/27/2020 1019  ? BUN 9 01/31/2015 1329  ? CREATININE 0.80 04/27/2020 1019  ? CREATININE 0.63 01/31/2015 1329  ? CALCIUM 9.1 04/27/2020 1019  ? CALCIUM 8.8 (L) 01/31/2015 1329  ? PROT 7.2 04/27/2020 1019  ? PROT 7.6 01/31/2015 1329  ? ALBUMIN 4.2  04/27/2020 1019  ? ALBUMIN 3.8 01/31/2015 1329  ? AST 20 04/27/2020 1019  ? AST 24 01/31/2015 1329  ? ALT 25 04/27/2020 1019  ? ALT 19 01/31/2015 1329  ? ALKPHOS 95 04/27/2020 1019  ? ALKPHOS 73 01/31/2015 1329  ? BILITOT 0.3 04/27/2020 1019  ? BILITOT 0.7 01/31/2015 1329  ? GFRNONAA 96 04/27/2020 1019  ? GFRNONAA >60 01/31/2015 1329  ? GFRAA 111 04/27/2020 1019  ? GFRAA >60 01/31/2015 1329  ? ? ?   ?Component Value Date/Time  ? WBC 9.6 02/21/2020 1119  ? WBC 17.0 (H) 08/22/2019 0741  ? RBC 5.10 02/21/2020 1119  ?  RBC 3.56 (L) 08/22/2019 0741  ? HGB 13.4 02/21/2020 1119  ? HCT 40.9 02/21/2020 1119  ? PLT 319 08/22/2019 0741  ? PLT 314 06/02/2019 0932  ? MCV 80 02/21/2020 1119  ? MCV 85 01/31/2015 1329  ? MCH 26.3 (L) 02/21/2020 1119  ? MCH 28.4 08/22/2019 0741  ? MCHC 32.8 02/21/2020 1119  ? MCHC 32.9 08/22/2019 0741  ? RDW 13.8 02/21/2020 1119  ? RDW 13.4 01/31/2015 1329  ? LYMPHSABS 3.4 (H) 02/21/2020 1119  ? MONOABS 0.7 02/24/2018 0405  ? EOSABS 0.2 02/21/2020 1119  ? BASOSABS 0.1 02/21/2020 1119  ? ? ?No results found for: POCLITH, LITHIUM  ? ?No results found for: PHENYTOIN, PHENOBARB, VALPROATE, CBMZ  ? ?.res ?Assessment: Plan:   ? ? ?Plan: ? ?PDMP reviewed ? ?Lamictal 100mg  BID ?Vraylar 3mg   ?Buspar 10mg  BID   ?Luvox 100mg  daily ? ?RTC 3 months ? ?Patient advised to contact office with any questions, adverse effects, or acute worsening in signs and symptoms. ? ?Counseled patient regarding potential benefits, risks, and side effects of Lamictal to include potential risk of Stevens-Johnson syndrome. Advised patient to stop taking Lamictal and contact office immediately if rash develops and to seek urgent medical attention if rash is severe and/or spreading quickly. ? ?Discussed potential metabolic side effects associated with atypical antipsychotics, as well as potential risk for movement side effects. Advised pt to contact office if movement side effects occur.  ? ?Diagnoses and all orders for this  visit: ? ?Generalized anxiety disorder ?-     busPIRone (BUSPAR) 10 MG tablet; Take 1 tablet (10 mg total) by mouth 2 (two) times daily. ? ?Bipolar I disorder (HCC) ?-     cariprazine (VRAYLAR) 3 MG capsule; Take 1 caps

## 2022-03-05 NOTE — Progress Notes (Unsigned)
New patient visit   Patient: Jill [name]   DOB: 09/03/1986   36 y.o. Female  MRN: [id number] Visit Date: 03/07/2022  Today's healthcare provider: Jacky Kindle, FNP   I,Tiffany J Bragg,acting as a scribe for Jacky Kindle, FNP.,have documented all relevant documentation on the behalf of Jacky Kindle, FNP,as directed by  Jacky Kindle, FNP while in the presence of Jacky Kindle, FNP.   Chief Complaint  Patient presents with   Anxiety    Patient states her symptoms are well treated with the medication she is on.    Depression    States her symptoms are well treated with the medications.    Nail Problem    States she has an area on her L big to that she wants looked at.   Obesity    Wants to discuss weight.   Subjective    Jill [name] is a 36 y.o. female who presents today as a new patient to establish care.  States she also wants to discuss her weight and a sore area on her R big toe.  HPI HPI     Anxiety    Additional comments: Patient states her symptoms are well treated with the medication she is on.         Depression    Additional comments: States her symptoms are well treated with the medications.         Nail Problem    Additional comments: States she has an area on her L big to that she wants looked at.        Obesity    Additional comments: Wants to discuss weight.      Last edited by Marlana Salvage, CMA on 03/07/2022  2:08 PM.       ---------------------------------------------------------------------------------------------------   Past Medical History:  Diagnosis Date   Bipolar 1 disorder (HCC)    Depression    Migraine    OCD (obsessive compulsive disorder)    TMJ (dislocation of temporomandibular joint)    Past Surgical History:  Procedure Laterality Date   CESAREAN SECTION N/A 08/21/2019   Procedure: CESAREAN SECTION;  Surgeon: Conard Novak, MD;  Location: [facility] ORS;  Service: Obstetrics;  Laterality: N/A;    DILATION AND EVACUATION N/A 03/03/2018   Procedure: DILATATION AND EVACUATION;  Surgeon: Nadara Mustard, MD;  Location: [facility] ORS;  Service: Gynecology;  Laterality: N/A;   LAPAROSCOPIC LYSIS OF ADHESIONS  03/21/2017   Procedure: LAPAROSCOPIC LYSIS OF ADHESIONS;  Surgeon: Conard Novak, MD;  Location: [facility] ORS;  Service: Gynecology;;   LAPAROSCOPY N/A 03/21/2017   Procedure: LAPAROSCOPY DIAGNOSTIC/FULGERATION OF ENDOMETRIAL IMPLANTS;  Surgeon: Conard Novak, MD;  Location: [facility] ORS;  Service: Gynecology;  Laterality: N/A;   None     Family Status  Relation Name Status   Mother  Alive       Unknown   Father  Alive       Unknown   Sister  Alive       Bipolar Disorder   MGM  (Not Specified)   MGF  (Not Specified)   PGM  (Not Specified)   PGF  (Not Specified)   Neg Hx  (Not Specified)   Family History  Problem Relation Age of Onset   Depression Mother    Alcohol abuse Father    Lung cancer Maternal Grandmother    Hypertension Maternal Grandmother    Hypertension Maternal Grandfather    Stroke Maternal Grandfather  Hypertension Paternal Grandmother    Headache Paternal Grandmother    Migraines Paternal Grandmother    Hypertension Paternal Grandfather    Breast cancer Neg Hx    Social History   Socioeconomic History   Marital status: Married    Spouse name: Thurmond Butts   Number of children: Not on file   Years of education: Not on file   Highest education level: Not on file  Occupational History   Occupation: Lab assisstant  Tobacco Use   Smoking status: Never   Smokeless tobacco: Never  Vaping Use   Vaping Use: Never used  Substance and Sexual Activity   Alcohol use: No    Alcohol/week: 0.0 standard drinks   Drug use: No   Sexual activity: Yes    Partners: Male    Birth control/protection: None  Other Topics Concern   Not on file  Social History Narrative   She is originally from [place] and graduated high school in [place]. She has been married for  approximately 2years and has no children. She has been working for [employer] for the past one month. She currently lives with her husband. She denies any physical or sexual abuse.   Social Determinants of Health   Financial Resource Strain: Not on file  Food Insecurity: Not on file  Transportation Needs: Not on file  Physical Activity: Not on file  Stress: Not on file  Social Connections: Not on file   Outpatient Medications Prior to Visit  Medication Sig   busPIRone (BUSPAR) 10 MG tablet Take 1 tablet (10 mg total) by mouth 2 (two) times daily.   cariprazine (VRAYLAR) 3 MG capsule Take 1 capsule (3 mg total) by mouth daily.   fluvoxaMINE (LUVOX) 100 MG tablet Take one tablet daily.   lamoTRIgine (LAMICTAL) 100 MG tablet Take 1 tablet (100 mg total) by mouth 2 (two) times daily.   ondansetron (ZOFRAN-ODT) 4 MG disintegrating tablet Take 1 tablet (4 mg total) by mouth every 8 (eight) hours as needed for nausea.   rizatriptan (MAXALT-MLT) 10 MG disintegrating tablet Take 1 tablet (10 mg total) by mouth as needed for migraine. May repeat in 2 hours if needed   [DISCONTINUED] AJOVY 225 MG/1.5ML SOAJ INJECT 225 MG UNDER THE SKIN EVERY 30 DAYS (Patient not taking: Reported on 03/07/2022)   [DISCONTINUED] Fremanezumab-vfrm (AJOVY) 225 MG/1.5ML SOAJ Inject 225 mg into the skin every 30 (thirty) days. (Patient not taking: Reported on 03/07/2022)   [DISCONTINUED] Fremanezumab-vfrm SOSY 225 mg    No facility-administered medications prior to visit.   Allergies  Allergen Reactions   Amoxicillin Anaphylaxis, Shortness Of Breath and Rash    Childhood reaction  Has patient had a PCN reaction causing immediate rash, facial/tongue/throat swelling, SOB or lightheadedness with hypotension: Yes Has patient had a PCN reaction causing severe rash involving mucus membranes or skin necrosis: Unknown Has patient had a PCN reaction that required hospitalization: No Has patient had a PCN reaction occurring within the  last 10 years: No If all of the above answers are "NO", then may proceed with Cephalosporin use.   Penicillins     Pt does not think that she is allergic to this medication but is not sure     Immunization History  Administered Date(s) Administered   Influenza,inj,Quad PF,6+ Mos 06/16/2019   MMR 08/23/2019   Tdap 06/30/2019    Health Maintenance  Topic Date Due   COVID-19 Vaccine (1) Never done   Hepatitis C Screening  Never done   INFLUENZA VACCINE  05/07/2022   PAP SMEAR-Modifier  02/21/2023   TETANUS/TDAP  06/29/2029   HIV Screening  Completed   HPV VACCINES  Aged Out    Patient Care Team: Gwyneth Sprout, FNP as PCP - General (Family Medicine)  Review of Systems     Objective    BP 127/86 (BP Location: Right Arm, Patient Position: Sitting, Cuff Size: Normal)   Pulse 100   Temp 98.6 F (37 C) (Oral)   Resp 16   Ht $R'5\' 9"'Dw$  (1.753 m)   Wt 268 lb 8 oz (121.8 kg)   SpO2 100%   BMI 39.65 kg/m    Physical Exam Vitals and nursing note reviewed.  Constitutional:      General: She is not in acute distress.    Appearance: Normal appearance. She is obese. She is not ill-appearing, toxic-appearing or diaphoretic.  HENT:     Head: Normocephalic and atraumatic.  Cardiovascular:     Rate and Rhythm: Regular rhythm. Tachycardia present.     Pulses: Normal pulses.     Heart sounds: Normal heart sounds. No murmur heard.   No friction rub. No gallop.  Pulmonary:     Effort: Pulmonary effort is normal. No respiratory distress.     Breath sounds: Normal breath sounds. No stridor. No wheezing, rhonchi or rales.  Chest:     Chest wall: No tenderness.  Abdominal:     General: Bowel sounds are normal.     Palpations: Abdomen is soft.  Musculoskeletal:        General: No swelling, tenderness, deformity or signs of injury. Normal range of motion.     Right lower leg: No edema.     Left lower leg: No edema.       Feet:  Skin:    General: Skin is warm and dry.      Capillary Refill: Capillary refill takes less than 2 seconds.     Coloration: Skin is not jaundiced or pale.     Findings: No bruising, erythema, lesion or rash.     Comments: Multiple tattoos   Neurological:     General: No focal deficit present.     Mental Status: She is alert and oriented to person, place, and time. Mental status is at baseline.     Cranial Nerves: No cranial nerve deficit.     Sensory: No sensory deficit.     Motor: No weakness.     Coordination: Coordination normal.  Psychiatric:        Mood and Affect: Mood is anxious. Affect is flat.        Behavior: Behavior normal.        Thought Content: Thought content normal.        Judgment: Judgment normal.    Depression Screen    03/07/2022    2:04 PM 02/21/2020   10:36 AM  PHQ 2/9 Scores  PHQ - 2 Score 0 2  PHQ- 9 Score 5 6   Results for orders placed or performed in visit on 03/07/22  Comprehensive Metabolic Panel (CMET)  Result Value Ref Range   Glucose 123 (H) 70 - 99 mg/dL   BUN 11 6 - 20 mg/dL   Creatinine, Ser 0.92 0.57 - 1.00 mg/dL   eGFR 83 >59 mL/min/1.73   BUN/Creatinine Ratio 12 9 - 23   Sodium 139 134 - 144 mmol/L   Potassium 4.3 3.5 - 5.2 mmol/L   Chloride 101 96 - 106 mmol/L   CO2 21 20 - 29 mmol/L  Calcium 9.5 8.7 - 10.2 mg/dL   Total Protein 7.4 6.0 - 8.5 g/dL   Albumin 4.3 3.8 - 4.8 g/dL   Globulin, Total 3.1 1.5 - 4.5 g/dL   Albumin/Globulin Ratio 1.4 1.2 - 2.2   Bilirubin Total 0.4 0.0 - 1.2 mg/dL   Alkaline Phosphatase 102 44 - 121 IU/L   AST 17 0 - 40 IU/L   ALT 15 0 - 32 IU/L  CBC with Differential/Platelet  Result Value Ref Range   WBC 12.3 (H) 3.4 - 10.8 x10E3/uL   RBC 5.19 3.77 - 5.28 x10E6/uL   Hemoglobin 14.2 11.1 - 15.9 g/dL   Hematocrit 41.3 34.0 - 46.6 %   MCV 80 79 - 97 fL   MCH 27.4 26.6 - 33.0 pg   MCHC 34.4 31.5 - 35.7 g/dL   RDW 12.7 11.7 - 15.4 %   Platelets 434 150 - 450 x10E3/uL   Neutrophils 68 Not Estab. %   Lymphs 24 Not Estab. %   Monocytes 7 Not  Estab. %   Eos 0 Not Estab. %   Basos 1 Not Estab. %   Neutrophils Absolute 8.4 (H) 1.4 - 7.0 x10E3/uL   Lymphocytes Absolute 2.9 0.7 - 3.1 x10E3/uL   Monocytes Absolute 0.8 0.1 - 0.9 x10E3/uL   EOS (ABSOLUTE) 0.1 0.0 - 0.4 x10E3/uL   Basophils Absolute 0.1 0.0 - 0.2 x10E3/uL   Immature Granulocytes 0 Not Estab. %   Immature Grans (Abs) 0.0 0.0 - 0.1 x10E3/uL  TSH + free T4  Result Value Ref Range   TSH 1.900 0.450 - 4.500 uIU/mL   Free T4 0.96 0.82 - 1.77 ng/dL  Lipid panel  Result Value Ref Range   Cholesterol, Total 145 100 - 199 mg/dL   Triglycerides 110 0 - 149 mg/dL   HDL 52 >39 mg/dL   VLDL Cholesterol Cal 20 5 - 40 mg/dL   LDL Chol Calc (NIH) 73 0 - 99 mg/dL   Chol/HDL Ratio 2.8 0.0 - 4.4 ratio  Vitamin D (25 hydroxy)  Result Value Ref Range   Vit D, 25-Hydroxy 17.0 (L) 30.0 - 100.0 ng/mL  Vitamin B6  Result Value Ref Range   Vitamin B6 WILL FOLLOW   B12 and Folate Panel  Result Value Ref Range   Vitamin B-12 308 232 - 1,245 pg/mL   Folate 5.9 >3.0 ng/mL    Assessment & Plan      Problem List Items Addressed This Visit       Cardiovascular and Mediastinum   Menstrual migraine without status migrainosus, not intractable    Chronic, stable Has not been satisified with injection Trial of nurtec provided in place of maxalt        Relevant Medications   Rimegepant Sulfate (NURTEC) 75 MG TBDP     Other   Chronic fatigue    Chronic, unknown cause Recommend full work up of labs including vitamin labs  Endorses snoring, denies day time fatigue or concern for apnea        Relevant Orders   Comprehensive Metabolic Panel (CMET) (Completed)   CBC with Differential/Platelet (Completed)   TSH + free T4 (Completed)   Lipid panel (Completed)   Vitamin D (25 hydroxy) (Completed)   Vitamin B6 (Completed)   B12 and Folate Panel (Completed)   Morbid obesity (HCC) - Primary    Chronic, stable Recommend use of wegovy if able; if not recommend balance of macro and  exercise/movement and increase in water Discussed alternatives Recommend she speak  with pscyh NP about use of Wellbutrin to assist with binge eating behaviors  Body mass index is 39.65 kg/m. Discussed importance of healthy weight management Discussed diet and exercise Associated with MDD, Bipolar, OCD      Relevant Medications   Semaglutide-Weight Management 0.25 MG/0.5ML SOAJ   Other Relevant Orders   Comprehensive Metabolic Panel (CMET) (Completed)   CBC with Differential/Platelet (Completed)   TSH + free T4 (Completed)   Lipid panel (Completed)   Vitamin D (25 hydroxy) (Completed)   Vitamin B6 (Completed)   B12 and Folate Panel (Completed)   Nail problem    Acute, unknown cause, will refer out Right great toe with associated drainage Denies known damage to nail Concern for melanocytic damage below nail Refer to podiatry        Relevant Orders   Ambulatory referral to Podiatry     Return in about 3 months (around 06/07/2022) for annual examination.     Vonna Kotyk, FNP, have reviewed all documentation for this visit. The documentation on 03/08/22 for the exam, diagnosis, procedures, and orders are all accurate and complete.    Gwyneth Sprout, [place] [phone] (phone) [phone] (fax)  [place]

## 2022-03-07 ENCOUNTER — Encounter: Payer: Self-pay | Admitting: Family Medicine

## 2022-03-07 ENCOUNTER — Ambulatory Visit: Payer: Managed Care, Other (non HMO) | Admitting: Family Medicine

## 2022-03-07 DIAGNOSIS — L609 Nail disorder, unspecified: Secondary | ICD-10-CM | POA: Diagnosis not present

## 2022-03-07 DIAGNOSIS — R5382 Chronic fatigue, unspecified: Secondary | ICD-10-CM | POA: Diagnosis not present

## 2022-03-07 DIAGNOSIS — G43829 Menstrual migraine, not intractable, without status migrainosus: Secondary | ICD-10-CM | POA: Diagnosis not present

## 2022-03-07 MED ORDER — NURTEC 75 MG PO TBDP: 75.0000 mg | ORAL_TABLET | ORAL | 0 refills | Status: DC | PRN

## 2022-03-07 MED ORDER — SEMAGLUTIDE-WEIGHT MANAGEMENT 0.25 MG/0.5ML ~~LOC~~ SOAJ: 0.2500 mg | SUBCUTANEOUS | 0 refills | Status: DC

## 2022-03-07 NOTE — Progress Notes (Unsigned)
New patient visit   Patient: Jill [name]   DOB: 1986/04/17   36 y.o. Female  MRN: [id number] Visit Date: 03/07/2022  Today's healthcare provider: Gwyneth Sprout, FNP  Patient presents for new patient visit to establish care.  Introduced to Designer, jewellery role and practice setting.  All questions answered.  Discussed provider/patient relationship and expectations.   I,Tiffany J Bragg,acting as a scribe for Gwyneth Sprout, FNP.,have documented all relevant documentation on the behalf of Gwyneth Sprout, FNP,as directed by  Gwyneth Sprout, FNP while in the presence of Gwyneth Sprout, FNP.   Chief Complaint  Patient presents with   Anxiety    Patient states her symptoms are well treated with the medication she is on.    Depression    States her symptoms are well treated with the medications.    Nail Problem    States she has an area on her L big to that she wants looked at.   Obesity    Wants to discuss weight.   Subjective    Jill [name] is a 36 y.o. female who presents today as a new patient to establish care.  States she also wants to discuss her weight and a sore area on her R big toe.  HPI HPI     Anxiety    Additional comments: Patient states her symptoms are well treated with the medication she is on.         Depression    Additional comments: States her symptoms are well treated with the medications.         Nail Problem    Additional comments: States she has an area on her L big to that she wants looked at.        Obesity    Additional comments: Wants to discuss weight.      Last edited by Smitty Knudsen, CMA on 03/07/2022  2:08 PM.       ---------------------------------------------------------------------------------------------------   Past Medical History:  Diagnosis Date   Bipolar 1 disorder ([place])    Depression    Migraine    OCD (obsessive compulsive disorder)    TMJ (dislocation of temporomandibular joint)    Past  Surgical History:  Procedure Laterality Date   CESAREAN SECTION N/A 08/21/2019   Procedure: CESAREAN SECTION;  Surgeon: Will Bonnet, MD;  Location: [facility];  Service: Obstetrics;  Laterality: N/A;   DILATION AND EVACUATION N/A 03/03/2018   Procedure: DILATATION AND EVACUATION;  Surgeon: Gae Dry, MD;  Location: [facility];  Service: Gynecology;  Laterality: N/A;   LAPAROSCOPIC LYSIS OF ADHESIONS  03/21/2017   Procedure: LAPAROSCOPIC LYSIS OF ADHESIONS;  Surgeon: Will Bonnet, MD;  Location: [facility];  Service: Gynecology;;   LAPAROSCOPY N/A 03/21/2017   Procedure: LAPAROSCOPY DIAGNOSTIC/FULGERATION OF ENDOMETRIAL IMPLANTS;  Surgeon: Will Bonnet, MD;  Location: [facility];  Service: Gynecology;  Laterality: N/A;   None     Family Status  Relation Name Status   Mother  Alive       Unknown   Father  Alive       Unknown   Sister  Alive       Bipolar Disorder   MGM  (Not Specified)   MGF  (Not Specified)   PGM  (Not Specified)   PGF  (Not Specified)   Neg Hx  (Not Specified)   Family History  Problem Relation Age of Onset   Depression Mother  Alcohol abuse Father    Lung cancer Maternal Grandmother    Hypertension Maternal Grandmother    Hypertension Maternal Grandfather    Stroke Maternal Grandfather    Hypertension Paternal Grandmother    Headache Paternal Grandmother    Migraines Paternal Grandmother    Hypertension Paternal Grandfather    Breast cancer Neg Hx    Social History   Socioeconomic History   Marital status: Married    Spouse name: [name]   Number of children: Not on file   Years of education: Not on file   Highest education level: Not on file  Occupational History   Occupation: Lab assisstant  Tobacco Use   Smoking status: Never   Smokeless tobacco: Never  Vaping Use   Vaping Use: Never used  Substance and Sexual Activity   Alcohol use: No    Alcohol/week: 0.0 standard drinks   Drug use: No   Sexual activity: Yes    Partners:  Male    Birth control/protection: None  Other Topics Concern   Not on file  Social History Narrative   She is originally from [place] and graduated high school in [place]. She has been married for approximately 2years and has no children. She has been working for [employer] for the past one month. She currently lives with her husband. She denies any physical or sexual abuse.   Social Determinants of Health   Financial Resource Strain: Not on file  Food Insecurity: Not on file  Transportation Needs: Not on file  Physical Activity: Not on file  Stress: Not on file  Social Connections: Not on file   Outpatient Medications Prior to Visit  Medication Sig   busPIRone (BUSPAR) 10 MG tablet Take 1 tablet (10 mg total) by mouth 2 (two) times daily.   cariprazine (VRAYLAR) 3 MG capsule Take 1 capsule (3 mg total) by mouth daily.   fluvoxaMINE (LUVOX) 100 MG tablet Take one tablet daily.   lamoTRIgine (LAMICTAL) 100 MG tablet Take 1 tablet (100 mg total) by mouth 2 (two) times daily.   ondansetron (ZOFRAN-ODT) 4 MG disintegrating tablet Take 1 tablet (4 mg total) by mouth every 8 (eight) hours as needed for nausea.   rizatriptan (MAXALT-MLT) 10 MG disintegrating tablet Take 1 tablet (10 mg total) by mouth as needed for migraine. May repeat in 2 hours if needed   [DISCONTINUED] AJOVY 225 MG/1.5ML SOAJ INJECT 225 MG UNDER THE SKIN EVERY 30 DAYS (Patient not taking: Reported on 03/07/2022)   [DISCONTINUED] Fremanezumab-vfrm (AJOVY) 225 MG/1.5ML SOAJ Inject 225 mg into the skin every 30 (thirty) days. (Patient not taking: Reported on 03/07/2022)   [DISCONTINUED] Fremanezumab-vfrm SOSY 225 mg    No facility-administered medications prior to visit.   Allergies  Allergen Reactions   Amoxicillin Anaphylaxis, Shortness Of Breath and Rash    Childhood reaction  Has patient had a PCN reaction causing immediate rash, facial/tongue/throat swelling, SOB or lightheadedness with hypotension: Yes Has patient  had a PCN reaction causing severe rash involving mucus membranes or skin necrosis: Unknown Has patient had a PCN reaction that required hospitalization: No Has patient had a PCN reaction occurring within the last 10 years: No If all of the above answers are "NO", then may proceed with Cephalosporin use.   Penicillins     Pt does not think that she is allergic to this medication but is not sure     Immunization History  Administered Date(s) Administered   Influenza,inj,Quad PF,6+ Mos 06/16/2019   MMR 08/23/2019  Tdap 06/30/2019    Health Maintenance  Topic Date Due   COVID-19 Vaccine (1) Never done   Hepatitis C Screening  Never done   INFLUENZA VACCINE  05/07/2022   PAP SMEAR-Modifier  02/21/2023   TETANUS/TDAP  06/29/2029   HIV Screening  Completed   HPV VACCINES  Aged Out    Patient Care Team: Gwyneth Sprout, FNP as PCP - General (Family Medicine)  Review of Systems     Objective    BP 127/86 (BP Location: Right Arm, Patient Position: Sitting, Cuff Size: Normal)   Pulse 100   Temp 98.6 F (37 C) (Oral)   Resp 16   Ht $R'5\' 9"'Mf$  (1.753 m)   Wt 268 lb 8 oz (121.8 kg)   SpO2 100%   BMI 39.65 kg/m    Physical Exam  Depression Screen    03/07/2022    2:04 PM 02/21/2020   10:36 AM  PHQ 2/9 Scores  PHQ - 2 Score 0 2  PHQ- 9 Score 5 6   No results found for any visits on 03/07/22.  Assessment & Plan      Problem List Items Addressed This Visit   None Visit Diagnoses     Chronic fatigue    -  Primary   Relevant Orders   Comprehensive Metabolic Panel (CMET)   CBC with Differential/Platelet   TSH + free T4   Lipid panel   Vitamin D (25 hydroxy)   Vitamin B6   B12 and Folate Panel   Morbid obesity (HCC)       Relevant Medications   Semaglutide-Weight Management 0.25 MG/0.5ML SOAJ   Other Relevant Orders   Comprehensive Metabolic Panel (CMET)   CBC with Differential/Platelet   TSH + free T4   Lipid panel   Vitamin D (25 hydroxy)   Vitamin B6   B12  and Folate Panel   Menstrual migraine without status migrainosus, not intractable       Relevant Medications   Rimegepant Sulfate (NURTEC) 75 MG TBDP   Nail problem       Relevant Orders   Ambulatory referral to Podiatry        No follow-ups on file.     Vonna Kotyk, FNP, have reviewed all documentation for this visit. The documentation on 03/07/22 for the exam, diagnosis, procedures, and orders are all accurate and complete.    Gwyneth Sprout, [place] [phone] (phone) [phone] (fax)  [facility]   Complete physical exam  Patient: Collene Massimino   DOB: 29-Nov-1985   36 y.o. Female  MRN: [id number]  Subjective:    Chief Complaint  Patient presents with   Anxiety    Patient states her symptoms are well treated with the medication she is on.    Depression    States her symptoms are well treated with the medications.    Nail Problem    States she has an area on her L big to that she wants looked at.   Obesity    Wants to discuss weight.    Alyannah Ertha Nabor is a 36 y.o. female who presents today for a complete physical exam. She reports consuming a {diet types:17450} diet. {types:19826} She generally feels {DESC; WELL/FAIRLY WELL/POORLY:18703}. She reports sleeping {DESC; WELL/FAIRLY WELL/POORLY:18703}. She {does/does not:200015} have additional problems to discuss today.    Most recent fall risk assessment:    03/07/2022    2:04 PM  [place]  in the past year? 0  Number falls in past yr: 0  Injury with Fall? 0     Most recent depression screenings:    03/07/2022    2:04 PM 02/21/2020   10:36 AM  PHQ 2/9 Scores  PHQ - 2 Score 0 2  PHQ- 9 Score 5 6    {VISON DENTAL STD PSA (Optional):27386}  {History (Optional):23778}  Patient Care Team: Gwyneth Sprout, FNP as PCP - General (Family Medicine)   Outpatient Medications Prior to Visit  Medication Sig   busPIRone (BUSPAR) 10 MG tablet Take 1  tablet (10 mg total) by mouth 2 (two) times daily.   cariprazine (VRAYLAR) 3 MG capsule Take 1 capsule (3 mg total) by mouth daily.   fluvoxaMINE (LUVOX) 100 MG tablet Take one tablet daily.   lamoTRIgine (LAMICTAL) 100 MG tablet Take 1 tablet (100 mg total) by mouth 2 (two) times daily.   ondansetron (ZOFRAN-ODT) 4 MG disintegrating tablet Take 1 tablet (4 mg total) by mouth every 8 (eight) hours as needed for nausea.   rizatriptan (MAXALT-MLT) 10 MG disintegrating tablet Take 1 tablet (10 mg total) by mouth as needed for migraine. May repeat in 2 hours if needed   [DISCONTINUED] AJOVY 225 MG/1.5ML SOAJ INJECT 225 MG UNDER THE SKIN EVERY 30 DAYS (Patient not taking: Reported on 03/07/2022)   [DISCONTINUED] Fremanezumab-vfrm (AJOVY) 225 MG/1.5ML SOAJ Inject 225 mg into the skin every 30 (thirty) days. (Patient not taking: Reported on 03/07/2022)   [DISCONTINUED] Fremanezumab-vfrm SOSY 225 mg    No facility-administered medications prior to visit.    ROS        Objective:     BP 127/86 (BP Location: Right Arm, Patient Position: Sitting, Cuff Size: Normal)   Pulse 100   Temp 98.6 F (37 C) (Oral)   Resp 16   Ht $R'5\' 9"'pr$  (1.753 m)   Wt 268 lb 8 oz (121.8 kg)   SpO2 100%   BMI 39.65 kg/m  {Vitals History (Optional):23777}  Physical Exam Vitals and nursing note reviewed.  Constitutional:      General: She is not in acute distress.    Appearance: Normal appearance. She is obese. She is not ill-appearing, toxic-appearing or diaphoretic.  HENT:     Head: Normocephalic and atraumatic.  Cardiovascular:     Rate and Rhythm: Normal rate and regular rhythm.     Pulses: Normal pulses.     Heart sounds: Normal heart sounds. No murmur heard.   No friction rub. No gallop.  Pulmonary:     Effort: Pulmonary effort is normal. No respiratory distress.     Breath sounds: Normal breath sounds. No stridor. No wheezing, rhonchi or rales.  Chest:     Chest wall: No tenderness.  Abdominal:      General: Bowel sounds are normal.     Palpations: Abdomen is soft.  Musculoskeletal:        General: No swelling, tenderness, deformity or signs of injury. Normal range of motion.     Right lower leg: No edema.     Left lower leg: No edema.  Skin:    General: Skin is warm and dry.     Capillary Refill: Capillary refill takes less than 2 seconds.     Coloration: Skin is not jaundiced or pale.     Findings: No bruising, erythema, lesion or rash.     Comments: Multiple tattoos  Neurological:     General: No focal deficit present.     Mental  Status: She is alert and oriented to person, place, and time. Mental status is at baseline.     Cranial Nerves: No cranial nerve deficit.     Sensory: No sensory deficit.     Motor: No weakness.     Coordination: Coordination normal.  Psychiatric:        Mood and Affect: Affect is flat.        Behavior: Behavior normal.        Thought Content: Thought content normal.        Judgment: Judgment normal.     No results found for any visits on 03/07/22. {Show previous labs (optional):23779}    Assessment & Plan:    Routine Health Maintenance and Physical Exam  Immunization History  Administered Date(s) Administered   Influenza,inj,Quad PF,6+ Mos 06/16/2019   MMR 08/23/2019   Tdap 06/30/2019    Health Maintenance  Topic Date Due   COVID-19 Vaccine (1) Never done   Hepatitis C Screening  Never done   INFLUENZA VACCINE  05/07/2022   PAP SMEAR-Modifier  02/21/2023   TETANUS/TDAP  06/29/2029   HIV Screening  Completed   HPV VACCINES  Aged Out    Discussed health benefits of physical activity, and encouraged her to engage in regular exercise appropriate for her age and condition.  Problem List Items Addressed This Visit       Cardiovascular and Mediastinum   Menstrual migraine without status migrainosus, not intractable    Chronic, stable Has not been satisified with injection Trial of nurtec provided in place of maxalt         Relevant Medications   Rimegepant Sulfate (NURTEC) 75 MG TBDP     Other   Chronic fatigue    Chronic, unknown cause Recommend full work up of labs including vitamin labs  Endorses snoring, denies day time fatigue or concern for apnea        Relevant Orders   Comprehensive Metabolic Panel (CMET)   CBC with Differential/Platelet   TSH + free T4   Lipid panel   Vitamin D (25 hydroxy)   Vitamin B6   B12 and Folate Panel   Morbid obesity ([place]) - Primary    Chronic, stable Recommend use of wegovy if able; if not recommend balance of macro and exercise/movement and increase in water Discussed alternatives Recommend she speak with pscyh NP about use of Wellbutrin to assist with binge eating behaviors  Body mass index is 39.65 kg/m. Discussed importance of healthy weight management Discussed diet and exercise Associated with MDD, Bipolar, OCD      Relevant Medications   Semaglutide-Weight Management 0.25 MG/0.5ML SOAJ   Other Relevant Orders   Comprehensive Metabolic Panel (CMET)   CBC with Differential/Platelet   TSH + free T4   Lipid panel   Vitamin D (25 hydroxy)   Vitamin B6   B12 and Folate Panel   Nail problem    Acute, unknown cause, will refer out Right great toe with associated drainage Denies known damage to nail Concern for melanocytic damage below nail Refer to podiatry        Relevant Orders   Ambulatory referral to Podiatry   Return in about 3 months (around 06/07/2022) for annual examination.     Gwyneth Sprout, FNP

## 2022-03-07 NOTE — Progress Notes (Deleted)
New patient visit   Patient: Jill [name]   DOB: 1986-08-19   36 y.o. Female  MRN: [id number] Visit Date: 03/07/2022  Today's healthcare provider: Gwyneth Sprout, FNP  Patient presents for new patient visit to establish care.  Introduced to Designer, jewellery role and practice setting.  All questions answered.  Discussed provider/patient relationship and expectations.   I,Tiffany J Bragg,acting as a scribe for Gwyneth Sprout, FNP.,have documented all relevant documentation on the behalf of Gwyneth Sprout, FNP,as directed by  Gwyneth Sprout, FNP while in the presence of Gwyneth Sprout, FNP.   Chief Complaint  Patient presents with   Anxiety    Patient states her symptoms are well treated with the medication she is on.    Depression    States her symptoms are well treated with the medications.    Nail Problem    States she has an area on her L big to that she wants looked at.   Obesity    Wants to discuss weight.   Subjective    Jill [name] is a 36 y.o. female who presents today as a new patient to establish care.  States she also wants to discuss her weight and a sore area on her R big toe.  HPI HPI     Anxiety    Additional comments: Patient states her symptoms are well treated with the medication she is on.         Depression    Additional comments: States her symptoms are well treated with the medications.         Nail Problem    Additional comments: States she has an area on her L big to that she wants looked at.        Obesity    Additional comments: Wants to discuss weight.      Last edited by Smitty Knudsen, CMA on 03/07/2022  2:08 PM.       ---------------------------------------------------------------------------------------------------   Past Medical History:  Diagnosis Date   Bipolar 1 disorder ([place])    Depression    Migraine    OCD (obsessive compulsive disorder)    TMJ (dislocation of temporomandibular joint)    Past  Surgical History:  Procedure Laterality Date   CESAREAN SECTION N/A 08/21/2019   Procedure: CESAREAN SECTION;  Surgeon: Will Bonnet, MD;  Location: [facility];  Service: Obstetrics;  Laterality: N/A;   DILATION AND EVACUATION N/A 03/03/2018   Procedure: DILATATION AND EVACUATION;  Surgeon: Gae Dry, MD;  Location: [facility];  Service: Gynecology;  Laterality: N/A;   LAPAROSCOPIC LYSIS OF ADHESIONS  03/21/2017   Procedure: LAPAROSCOPIC LYSIS OF ADHESIONS;  Surgeon: Will Bonnet, MD;  Location: [facility];  Service: Gynecology;;   LAPAROSCOPY N/A 03/21/2017   Procedure: LAPAROSCOPY DIAGNOSTIC/FULGERATION OF ENDOMETRIAL IMPLANTS;  Surgeon: Will Bonnet, MD;  Location: [facility];  Service: Gynecology;  Laterality: N/A;   None     Family Status  Relation Name Status   Mother  Alive       Unknown   Father  Alive       Unknown   Sister  Alive       Bipolar Disorder   MGM  (Not Specified)   MGF  (Not Specified)   PGM  (Not Specified)   PGF  (Not Specified)   Neg Hx  (Not Specified)   Family History  Problem Relation Age of Onset   Depression Mother  Alcohol abuse Father    Lung cancer Maternal Grandmother    Hypertension Maternal Grandmother    Hypertension Maternal Grandfather    Stroke Maternal Grandfather    Hypertension Paternal Grandmother    Headache Paternal Grandmother    Migraines Paternal Grandmother    Hypertension Paternal Grandfather    Breast cancer Neg Hx    Social History   Socioeconomic History   Marital status: Married    Spouse name: [name]   Number of children: Not on file   Years of education: Not on file   Highest education level: Not on file  Occupational History   Occupation: Lab assisstant  Tobacco Use   Smoking status: Never   Smokeless tobacco: Never  Vaping Use   Vaping Use: Never used  Substance and Sexual Activity   Alcohol use: No    Alcohol/week: 0.0 standard drinks   Drug use: No   Sexual activity: Yes    Partners:  Male    Birth control/protection: None  Other Topics Concern   Not on file  Social History Narrative   She is originally from [place] and graduated high school in [place]. She has been married for approximately 2years and has no children. She has been working for [employer] for the past one month. She currently lives with her husband. She denies any physical or sexual abuse.   Social Determinants of Health   Financial Resource Strain: Not on file  Food Insecurity: Not on file  Transportation Needs: Not on file  Physical Activity: Not on file  Stress: Not on file  Social Connections: Not on file   Outpatient Medications Prior to Visit  Medication Sig   busPIRone (BUSPAR) 10 MG tablet Take 1 tablet (10 mg total) by mouth 2 (two) times daily.   cariprazine (VRAYLAR) 3 MG capsule Take 1 capsule (3 mg total) by mouth daily.   fluvoxaMINE (LUVOX) 100 MG tablet Take one tablet daily.   lamoTRIgine (LAMICTAL) 100 MG tablet Take 1 tablet (100 mg total) by mouth 2 (two) times daily.   ondansetron (ZOFRAN-ODT) 4 MG disintegrating tablet Take 1 tablet (4 mg total) by mouth every 8 (eight) hours as needed for nausea.   rizatriptan (MAXALT-MLT) 10 MG disintegrating tablet Take 1 tablet (10 mg total) by mouth as needed for migraine. May repeat in 2 hours if needed   [DISCONTINUED] AJOVY 225 MG/1.5ML SOAJ INJECT 225 MG UNDER THE SKIN EVERY 30 DAYS (Patient not taking: Reported on 03/07/2022)   [DISCONTINUED] Fremanezumab-vfrm (AJOVY) 225 MG/1.5ML SOAJ Inject 225 mg into the skin every 30 (thirty) days. (Patient not taking: Reported on 03/07/2022)   [DISCONTINUED] Fremanezumab-vfrm SOSY 225 mg    No facility-administered medications prior to visit.   Allergies  Allergen Reactions   Amoxicillin Anaphylaxis, Shortness Of Breath and Rash    Childhood reaction  Has patient had a PCN reaction causing immediate rash, facial/tongue/throat swelling, SOB or lightheadedness with hypotension: Yes Has patient  had a PCN reaction causing severe rash involving mucus membranes or skin necrosis: Unknown Has patient had a PCN reaction that required hospitalization: No Has patient had a PCN reaction occurring within the last 10 years: No If all of the above answers are "NO", then may proceed with Cephalosporin use.   Penicillins     Pt does not think that she is allergic to this medication but is not sure     Immunization History  Administered Date(s) Administered   Influenza,inj,Quad PF,6+ Mos 06/16/2019   MMR 08/23/2019  Tdap 06/30/2019    Health Maintenance  Topic Date Due   COVID-19 Vaccine (1) Never done   Hepatitis C Screening  Never done   INFLUENZA VACCINE  05/07/2022   PAP SMEAR-Modifier  02/21/2023   TETANUS/TDAP  06/29/2029   HIV Screening  Completed   HPV VACCINES  Aged Out    Patient Care Team: Gwyneth Sprout, FNP as PCP - General (Family Medicine)  Review of Systems     Objective    BP 127/86 (BP Location: Right Arm, Patient Position: Sitting, Cuff Size: Normal)   Pulse 100   Temp 98.6 F (37 C) (Oral)   Resp 16   Ht $R'5\' 9"'zv$  (1.753 m)   Wt 268 lb 8 oz (121.8 kg)   SpO2 100%   BMI 39.65 kg/m    Physical Exam  Depression Screen    03/07/2022    2:04 PM 02/21/2020   10:36 AM  PHQ 2/9 Scores  PHQ - 2 Score 0 2  PHQ- 9 Score 5 6   No results found for any visits on 03/07/22.  Assessment & Plan      Problem List Items Addressed This Visit   None Visit Diagnoses     Chronic fatigue    -  Primary   Relevant Orders   Comprehensive Metabolic Panel (CMET)   CBC with Differential/Platelet   TSH + free T4   Lipid panel   Vitamin D (25 hydroxy)   Vitamin B6   B12 and Folate Panel   Morbid obesity ([facility])       Relevant Medications   Semaglutide-Weight Management 0.25 MG/0.5ML SOAJ   Other Relevant Orders   Comprehensive Metabolic Panel (CMET)   CBC with Differential/Platelet   TSH + free T4   Lipid panel   Vitamin D (25 hydroxy)   Vitamin B6   B12  and Folate Panel   Menstrual migraine without status migrainosus, not intractable       Relevant Medications   Rimegepant Sulfate (NURTEC) 75 MG TBDP   Nail problem       Relevant Orders   Ambulatory referral to Podiatry        No follow-ups on file.     Vonna Kotyk, FNP, have reviewed all documentation for this visit. The documentation on 03/07/22 for the exam, diagnosis, procedures, and orders are all accurate and complete.    Gwyneth Sprout, [place] [phone] (phone) [phone] (fax)  [place]

## 2022-03-07 NOTE — Progress Notes (Incomplete)
New patient visit   Patient: Jill [name]   DOB: [birth year]   36 y.o. Female  MRN: [id number] Visit Date: 03/07/2022  Today's healthcare provider: Jacky Kindle, FNP  Patient presents for new patient visit to establish care.  Introduced to Publishing rights manager role and practice setting.  All questions answered.  Discussed provider/patient relationship and expectations.   I,Jill [name],acting as a scribe for Jacky Kindle, FNP.,have documented all relevant documentation on the behalf of Jacky Kindle, FNP,as directed by  Jacky Kindle, FNP while in the presence of Jacky Kindle, FNP.   Chief Complaint  Patient presents with  . Annual Exam  . Anxiety    Patient states her symptoms are well treated with the medication she is on.   . Depression    States her symptoms are well treated with the medications.    Subjective    Jill [name] is a 36 y.o. female who presents today as a new patient to establish care.  States she also wants to discuss her weight and a sore area on her R big toe.  HPI HPI     Anxiety    Additional comments: Patient states her symptoms are well treated with the medication she is on.         Depression    Additional comments: States her symptoms are well treated with the medications.       Last edited by Marlana Salvage, CMA on 03/07/2022  2:02 PM.       ---------------------------------------------------------------------------------------------------   Past Medical History:  Diagnosis Date  . Bipolar 1 disorder (HCC)   . Depression   . Migraine   . OCD (obsessive compulsive disorder)   . TMJ (dislocation of temporomandibular joint)    Past Surgical History:  Procedure Laterality Date  . CESAREAN SECTION N/A 08/21/2019   Procedure: CESAREAN SECTION;  Surgeon: Conard Novak, MD;  Location: [facility];  Service: Obstetrics;  Laterality: N/A;  . DILATION AND EVACUATION N/A 03/03/2018   Procedure: DILATATION AND EVACUATION;   Surgeon: Nadara Mustard, MD;  Location: [facility];  Service: Gynecology;  Laterality: N/A;  . LAPAROSCOPIC LYSIS OF ADHESIONS  03/21/2017   Procedure: LAPAROSCOPIC LYSIS OF ADHESIONS;  Surgeon: Conard Novak, MD;  Location: [facility];  Service: Gynecology;;  . LAPAROSCOPY N/A 03/21/2017   Procedure: LAPAROSCOPY DIAGNOSTIC/FULGERATION OF ENDOMETRIAL IMPLANTS;  Surgeon: Conard Novak, MD;  Location: [facility];  Service: Gynecology;  Laterality: N/A;  . None     Family Status  Relation Name Status  . Mother  Alive       Unknown  . Father  Alive       Unknown  . Sister  Alive       Bipolar Disorder  . MGM  (Not Specified)  . MGF  (Not Specified)  . PGM  (Not Specified)  . PGF  (Not Specified)  . Neg Hx  (Not Specified)   Family History  Problem Relation Age of Onset  . Depression Mother   . Alcohol abuse Father   . Lung cancer Maternal Grandmother   . Hypertension Maternal Grandmother   . Hypertension Maternal Grandfather   . Stroke Maternal Grandfather   . Hypertension Paternal Grandmother   . Headache Paternal Grandmother   . Migraines Paternal Grandmother   . Hypertension Paternal Grandfather   . Breast cancer Neg Hx    Social History   Socioeconomic History  . Marital status: Married  Spouse name: Thurmond Butts  . Number of children: Not on file  . Years of education: Not on file  . Highest education level: Not on file  Occupational History  . Occupation: Lab assisstant  Tobacco Use  . Smoking status: Never  . Smokeless tobacco: Never  Vaping Use  . Vaping Use: Never used  Substance and Sexual Activity  . Alcohol use: No    Alcohol/week: 0.0 standard drinks  . Drug use: No  . Sexual activity: Yes    Partners: Male    Birth control/protection: None  Other Topics Concern  . Not on file  Social History Narrative   She is originally from [place] and graduated high school in [place]. She has been married for approximately 2years and has no children. She  has been working for [employer] for the past one month. She currently lives with her husband. She denies any physical or sexual abuse.   Social Determinants of Health   Financial Resource Strain: Not on file  Food Insecurity: Not on file  Transportation Needs: Not on file  Physical Activity: Not on file  Stress: Not on file  Social Connections: Not on file   Outpatient Medications Prior to Visit  Medication Sig  . busPIRone (BUSPAR) 10 MG tablet Take 1 tablet (10 mg total) by mouth 2 (two) times daily.  . cariprazine (VRAYLAR) 3 MG capsule Take 1 capsule (3 mg total) by mouth daily.  . fluvoxaMINE (LUVOX) 100 MG tablet Take one tablet daily.  Marland Kitchen lamoTRIgine (LAMICTAL) 100 MG tablet Take 1 tablet (100 mg total) by mouth 2 (two) times daily.  . ondansetron (ZOFRAN-ODT) 4 MG disintegrating tablet Take 1 tablet (4 mg total) by mouth every 8 (eight) hours as needed for nausea.  . rizatriptan (MAXALT-MLT) 10 MG disintegrating tablet Take 1 tablet (10 mg total) by mouth as needed for migraine. May repeat in 2 hours if needed  . AJOVY 225 MG/1.5ML SOAJ INJECT 225 MG UNDER THE SKIN EVERY 30 DAYS (Patient not taking: Reported on 03/07/2022)  . Fremanezumab-vfrm (AJOVY) 225 MG/1.5ML SOAJ Inject 225 mg into the skin every 30 (thirty) days. (Patient not taking: Reported on 03/07/2022)   Facility-Administered Medications Prior to Visit  Medication Dose Route Frequency Provider  . Fremanezumab-vfrm SOSY 225 mg  225 mg Subcutaneous Once Melvenia Beam, MD   Allergies  Allergen Reactions  . Amoxicillin Anaphylaxis, Shortness Of Breath and Rash    Childhood reaction  Has patient had a PCN reaction causing immediate rash, facial/tongue/throat swelling, SOB or lightheadedness with hypotension: Yes Has patient had a PCN reaction causing severe rash involving mucus membranes or skin necrosis: Unknown Has patient had a PCN reaction that required hospitalization: No Has patient had a PCN reaction occurring within  the last 10 years: No If all of the above answers are "NO", then may proceed with Cephalosporin use.  Marland Kitchen Penicillins     Pt does not think that she is allergic to this medication but is not sure     Immunization History  Administered Date(s) Administered  . Influenza,inj,Quad PF,6+ Mos 06/16/2019  . MMR 08/23/2019  . Tdap 06/30/2019    Health Maintenance  Topic Date Due  . COVID-19 Vaccine (1) Never done  . Hepatitis C Screening  Never done  . INFLUENZA VACCINE  05/07/2022  . PAP SMEAR-Modifier  02/21/2023  . TETANUS/TDAP  06/29/2029  . HIV Screening  Completed  . HPV VACCINES  Aged Out    Patient Care Team: Gwyneth Sprout, FNP  as PCP - General (Family Medicine)  Review of Systems     Objective    BP 127/86 (BP Location: Right Arm, Patient Position: Sitting, Cuff Size: Normal)   Pulse 100   Temp 98.6 F (37 C) (Oral)   Resp 16   Ht $R'5\' 9"'kv$  (1.753 m)   Wt 268 lb 8 oz (121.8 kg)   SpO2 100%   BMI 39.65 kg/m    Physical Exam ***  Depression Screen    03/07/2022    2:04 PM 02/21/2020   10:36 AM  PHQ 2/9 Scores  PHQ - 2 Score 0 2  PHQ- 9 Score 5 6   No results found for any visits on 03/07/22.  Assessment & Plan     ***  No follow-ups on file.     {provider attestation***:1}   Gwyneth Sprout, [place] [phone] (phone) [phone] (fax)  [place]

## 2022-03-07 NOTE — Assessment & Plan Note (Addendum)
Chronic, stable Recommend use of wegovy if able; if not recommend balance of macro and exercise/movement and increase in water Discussed alternatives Recommend she speak with pscyh NP about use of Wellbutrin to assist with binge eating behaviors  Body mass index is 39.65 kg/m. Discussed importance of healthy weight management Discussed diet and exercise Associated with MDD, Bipolar, OCD

## 2022-03-07 NOTE — Assessment & Plan Note (Signed)
Chronic, stable Has not been satisified with injection Trial of nurtec provided in place of maxalt

## 2022-03-07 NOTE — Assessment & Plan Note (Signed)
Chronic, unknown cause Recommend full work up of labs including vitamin labs  Endorses snoring, denies day time fatigue or concern for apnea

## 2022-03-07 NOTE — Assessment & Plan Note (Signed)
Acute, unknown cause, will refer out Right great toe with associated drainage Denies known damage to nail Concern for melanocytic damage below nail Refer to podiatry

## 2022-03-08 ENCOUNTER — Other Ambulatory Visit: Payer: Self-pay | Admitting: Family Medicine

## 2022-03-08 DIAGNOSIS — E559 Vitamin D deficiency, unspecified: Secondary | ICD-10-CM

## 2022-03-08 MED ORDER — VITAMIN D (ERGOCALCIFEROL) 1.25 MG (50000 UNIT) PO CAPS: 50000.0000 [IU] | ORAL_CAPSULE | ORAL | 3 refills | Status: DC

## 2022-03-10 ENCOUNTER — Encounter: Payer: Self-pay | Admitting: Family Medicine

## 2022-03-11 LAB — TSH+FREE T4
Free T4: 0.96 ng/dL (ref 0.82–1.77)
TSH: 1.9 u[IU]/mL (ref 0.450–4.500)

## 2022-03-11 LAB — CBC WITH DIFFERENTIAL/PLATELET
Basophils Absolute: 0.1 10*3/uL (ref 0.0–0.2)
Basos: 1 %
EOS (ABSOLUTE): 0.1 10*3/uL (ref 0.0–0.4)
Eos: 0 %
Hematocrit: 41.3 % (ref 34.0–46.6)
Hemoglobin: 14.2 g/dL (ref 11.1–15.9)
Immature Grans (Abs): 0 10*3/uL (ref 0.0–0.1)
Immature Granulocytes: 0 %
Lymphocytes Absolute: 2.9 10*3/uL (ref 0.7–3.1)
Lymphs: 24 %
MCH: 27.4 pg (ref 26.6–33.0)
MCHC: 34.4 g/dL (ref 31.5–35.7)
MCV: 80 fL (ref 79–97)
Monocytes Absolute: 0.8 10*3/uL (ref 0.1–0.9)
Monocytes: 7 %
Neutrophils Absolute: 8.4 10*3/uL — ABNORMAL HIGH (ref 1.4–7.0)
Neutrophils: 68 %
Platelets: 434 10*3/uL (ref 150–450)
RBC: 5.19 x10E6/uL (ref 3.77–5.28)
RDW: 12.7 % (ref 11.7–15.4)
WBC: 12.3 10*3/uL — ABNORMAL HIGH (ref 3.4–10.8)

## 2022-03-11 LAB — COMPREHENSIVE METABOLIC PANEL
ALT: 15 IU/L (ref 0–32)
AST: 17 IU/L (ref 0–40)
Albumin/Globulin Ratio: 1.4 (ref 1.2–2.2)
Albumin: 4.3 g/dL (ref 3.8–4.8)
Alkaline Phosphatase: 102 IU/L (ref 44–121)
BUN/Creatinine Ratio: 12 (ref 9–23)
BUN: 11 mg/dL (ref 6–20)
Bilirubin Total: 0.4 mg/dL (ref 0.0–1.2)
CO2: 21 mmol/L (ref 20–29)
Calcium: 9.5 mg/dL (ref 8.7–10.2)
Chloride: 101 mmol/L (ref 96–106)
Creatinine, Ser: 0.92 mg/dL (ref 0.57–1.00)
Globulin, Total: 3.1 g/dL (ref 1.5–4.5)
Glucose: 123 mg/dL — ABNORMAL HIGH (ref 70–99)
Potassium: 4.3 mmol/L (ref 3.5–5.2)
Sodium: 139 mmol/L (ref 134–144)
Total Protein: 7.4 g/dL (ref 6.0–8.5)
eGFR: 83 mL/min/{1.73_m2} (ref >59)

## 2022-03-11 LAB — LIPID PANEL
Chol/HDL Ratio: 2.8 ratio (ref 0.0–4.4)
Cholesterol, Total: 145 mg/dL (ref 100–199)
HDL: 52 mg/dL (ref >39)
LDL Chol Calc (NIH): 73 mg/dL (ref 0–99)
Triglycerides: 110 mg/dL (ref 0–149)
VLDL Cholesterol Cal: 20 mg/dL (ref 5–40)

## 2022-03-11 LAB — B12 AND FOLATE PANEL
Folate: 5.9 ng/mL (ref >3.0)
Vitamin B-12: 308 pg/mL (ref 232–1245)

## 2022-03-11 LAB — VITAMIN D 25 HYDROXY (VIT D DEFICIENCY, FRACTURES): Vit D, 25-Hydroxy: 17 ng/mL — ABNORMAL LOW (ref 30.0–100.0)

## 2022-03-11 LAB — VITAMIN B6: Vitamin B6: 5.4 ug/L (ref 3.4–65.2)

## 2022-03-12 ENCOUNTER — Ambulatory Visit: Payer: Managed Care, Other (non HMO) | Admitting: Podiatry

## 2022-03-15 ENCOUNTER — Encounter: Payer: Self-pay | Admitting: Podiatry

## 2022-03-15 ENCOUNTER — Ambulatory Visit: Payer: Managed Care, Other (non HMO) | Admitting: Podiatry

## 2022-03-15 DIAGNOSIS — B351 Tinea unguium: Secondary | ICD-10-CM

## 2022-03-15 MED ORDER — TERBINAFINE HCL 250 MG PO TABS: 250.0000 mg | ORAL_TABLET | Freq: Every day | ORAL | 0 refills | Status: DC

## 2022-03-15 NOTE — Progress Notes (Signed)
   Subjective: 36 y.o. female presenting today as a new patient for evaluation of discoloration to the bilateral great toenails consistent with fungus.  Patient is concerned for possible fungus.  She has not done anything for treatment.  They have been present for about 2 years now she presents for further treatment and evaluation  Past Medical History:  Diagnosis Date   Bipolar 1 disorder (HCC)    Depression    Migraine    OCD (obsessive compulsive disorder)    TMJ (dislocation of temporomandibular joint)     Objective: Physical Exam General: The patient is alert and oriented x3 in no acute distress.  Dermatology: Hyperkeratotic, discolored, thickened, onychodystrophy noted to the bilateral great toes. Skin is warm, dry and supple bilateral lower extremities. Negative for open lesions or macerations.  Vascular: Palpable pedal pulses bilaterally. No edema or erythema noted. Capillary refill within normal limits.  Neurological: Epicritic and protective threshold grossly intact bilaterally.   Musculoskeletal Exam: Range of motion within normal limits to all pedal and ankle joints bilateral. Muscle strength 5/5 in all groups bilateral.   Assessment: #1 Onychomycosis of toenails  Plan of Care:  #1 Patient was evaluated. #2  Today we discussed different treatment options including oral, topical, and laser antifungal treatment modalities.  We discussed their efficacies and side effects.  Patient opts for oral antifungal treatment modality #3 prescription for Lamisil 250 mg #90 daily.  She denies a history of liver pathology or symptoms.  Patient is otherwise healthy. Labs taken 03/07/22 WNL #4 return to clinic 6 months   Felecia Shelling, DPM [facility]  Dr. Felecia Shelling, DPM    [address] [place], [place] [address]                Office [phone]  Fax [phone]

## 2022-04-01 ENCOUNTER — Encounter: Payer: Self-pay | Admitting: Family Medicine

## 2022-04-02 ENCOUNTER — Other Ambulatory Visit: Payer: Self-pay | Admitting: Family Medicine

## 2022-04-02 MED ORDER — SEMAGLUTIDE-WEIGHT MANAGEMENT 0.5 MG/0.5ML ~~LOC~~ SOAJ: 0.5000 mg | SUBCUTANEOUS | 0 refills | Status: DC

## 2022-06-18 ENCOUNTER — Ambulatory Visit: Payer: Managed Care, Other (non HMO) | Admitting: Family Medicine

## 2022-06-26 NOTE — Progress Notes (Unsigned)
Complete physical exam   Patient: Jill [name]   DOB: 1986-02-05   36 y.o. Female  MRN: [id number] Visit Date: 06/27/2022  Today's healthcare provider: Gwyneth Sprout, FNP  Re Continue to recommend balanced, lower carb meals. Smaller meal size, adding snacks. Choosing water as drink of choice and increasing purposeful exercise.   I,Maddox Hlavaty J Leif Loflin,acting as a scribe for Gwyneth Sprout, FNP.,have documented all relevant documentation on the behalf of Gwyneth Sprout, FNP,as directed by  Gwyneth Sprout, FNP while in the presence of Gwyneth Sprout, FNP.   Chief Complaint  Patient presents with   Annual Exam   Subjective    Jill [name] is a 36 y.o. female who presents today for a complete physical exam.  She reports consuming a general diet. The patient does not participate in regular exercise at present. She generally feels fairly well. She reports sleeping fairly well. She does not have additional problems to discuss today.  HPI HPI   Patient was seen almost 4 months ago to start wegovy, but it has not been in stock so she has not started it.  Last edited by Smitty Knudsen, CMA on 06/27/2022  3:59 PM.       Past Medical History:  Diagnosis Date   Bipolar 1 disorder ([place])    Depression    Migraine    OCD (obsessive compulsive disorder)    TMJ (dislocation of temporomandibular joint)    Past Surgical History:  Procedure Laterality Date   CESAREAN SECTION N/A 08/21/2019   Procedure: CESAREAN SECTION;  Surgeon: Will Bonnet, MD;  Location: [facility] ORS;  Service: Obstetrics;  Laterality: N/A;   DILATION AND EVACUATION N/A 03/03/2018   Procedure: DILATATION AND EVACUATION;  Surgeon: Gae Dry, MD;  Location: [facility] OR[facility];  Service: Gynecology;  Laterality: N/A;   LAPAROSCOPIC LYSIS OF ADHESIONS  03/21/2017   Procedure: LAPAROSCOPIC LYSIS OF ADHESIONS;  Surgeon: Will Bonnet, MD;  Location: [facility] OR[facility];  Service: Gynecology;;   LAPAROSCOPY N/A 03/21/2017    Procedure: LAPAROSCOPY DIAGNOSTIC/FULGERATION OF ENDOMETRIAL IMPLANTS;  Surgeon: Will Bonnet, MD;  Location: [facility];  Service: Gynecology;  Laterality: N/A;   None     Social History   Socioeconomic History   Marital status: Married    Spouse name: Thurmond Butts   Number of children: Not on file   Years of education: Not on file   Highest education level: Not on file  Occupational History   Occupation: Lab assisstant  Tobacco Use   Smoking status: Never   Smokeless tobacco: Never  Vaping Use   Vaping Use: Never used  Substance and Sexual Activity   Alcohol use: No    Alcohol/week: 0.0 standard drinks of alcohol   Drug use: No   Sexual activity: Yes    Partners: Male    Birth control/protection: None  Other Topics Concern   Not on file  Social History Narrative   She is originally from [place] and graduated high school in [place]. She has been married for approximately 2years and has no children. She has been working for [employer] for the past one month. She currently lives with her husband. She denies any physical or sexual abuse.   Social Determinants of Health   Financial Resource Strain: Not on file  Food Insecurity: Not on file  Transportation Needs: Not on file  Physical Activity: Not on file  Stress: Not on file  Social Connections: Not on file  Intimate  Partner Violence: Not on file   Family Status  Relation Name Status   Mother  Alive       Unknown   Father  Alive       Unknown   Sister  Alive       Bipolar Disorder   MGM  (Not Specified)   MGF  (Not Specified)   PGM  (Not Specified)   PGF  (Not Specified)   Neg Hx  (Not Specified)   Family History  Problem Relation Age of Onset   Depression Mother    Alcohol abuse Father    Lung cancer Maternal Grandmother    Hypertension Maternal Grandmother    Hypertension Maternal Grandfather    Stroke Maternal Grandfather    Hypertension Paternal Grandmother    Headache Paternal Grandmother    Migraines  Paternal Grandmother    Hypertension Paternal Grandfather    Breast cancer Neg Hx    Allergies  Allergen Reactions   Amoxicillin Anaphylaxis, Shortness Of Breath and Rash    Childhood reaction  Has patient had a PCN reaction causing immediate rash, facial/tongue/throat swelling, SOB or lightheadedness with hypotension: Yes Has patient had a PCN reaction causing severe rash involving mucus membranes or skin necrosis: Unknown Has patient had a PCN reaction that required hospitalization: No Has patient had a PCN reaction occurring within the last 10 years: No If all of the above answers are "NO", then may proceed with Cephalosporin use.   Penicillins     Pt does not think that she is allergic to this medication but is not sure     Patient Care Team: Gwyneth Sprout, FNP as PCP - General (Family Medicine)   Medications: Outpatient Medications Prior to Visit  Medication Sig   busPIRone (BUSPAR) 10 MG tablet Take 1 tablet (10 mg total) by mouth 2 (two) times daily.   cariprazine (VRAYLAR) 3 MG capsule Take 1 capsule (3 mg total) by mouth daily.   fluvoxaMINE (LUVOX) 100 MG tablet Take one tablet daily.   lamoTRIgine (LAMICTAL) 100 MG tablet Take 1 tablet (100 mg total) by mouth 2 (two) times daily.   Rimegepant Sulfate (NURTEC) 75 MG TBDP Take 75 mg by mouth as needed.   rizatriptan (MAXALT-MLT) 10 MG disintegrating tablet Take 1 tablet (10 mg total) by mouth as needed for migraine. May repeat in 2 hours if needed   Semaglutide-Weight Management 0.5 MG/0.5ML SOAJ Inject 0.5 mg into the skin once a week.   terbinafine (LAMISIL) 250 MG tablet Take 1 tablet (250 mg total) by mouth daily.   Vitamin D, Ergocalciferol, (DRISDOL) 1.25 MG (50000 UNIT) CAPS capsule Take 1 capsule (50,000 Units total) by mouth every 7 (seven) days.   [DISCONTINUED] ondansetron (ZOFRAN-ODT) 4 MG disintegrating tablet Take 1 tablet (4 mg total) by mouth every 8 (eight) hours as needed for nausea.   No  facility-administered medications prior to visit.    Review of Systems  Last CBC Lab Results  Component Value Date   WBC 12.3 (H) 03/07/2022   HGB 14.2 03/07/2022   HCT 41.3 03/07/2022   MCV 80 03/07/2022   MCH 27.4 03/07/2022   RDW 12.7 03/07/2022   PLT 434 84/66/5993   Last metabolic panel Lab Results  Component Value Date   GLUCOSE 123 (H) 03/07/2022   NA 139 03/07/2022   K 4.3 03/07/2022   CL 101 03/07/2022   CO2 21 03/07/2022   BUN 11 03/07/2022   CREATININE 0.92 03/07/2022   EGFR 83 03/07/2022  CALCIUM 9.5 03/07/2022   PROT 7.4 03/07/2022   ALBUMIN 4.3 03/07/2022   LABGLOB 3.1 03/07/2022   AGRATIO 1.4 03/07/2022   BILITOT 0.4 03/07/2022   ALKPHOS 102 03/07/2022   AST 17 03/07/2022   ALT 15 03/07/2022   ANIONGAP 12 08/19/2019   Last lipids Lab Results  Component Value Date   CHOL 145 03/07/2022   HDL 52 03/07/2022   LDLCALC 73 03/07/2022   TRIG 110 03/07/2022   CHOLHDL 2.8 03/07/2022   Last hemoglobin A1c No results found for: "HGBA1C" Last thyroid functions Lab Results  Component Value Date   TSH 1.900 03/07/2022   Last vitamin D Lab Results  Component Value Date   VD25OH 17.0 (L) 03/07/2022   Last vitamin B12 and Folate Lab Results  Component Value Date   VITAMINB12 308 03/07/2022   FOLATE 5.9 03/07/2022      Objective     BP 123/85 (BP Location: Right Arm, Patient Position: Sitting, Cuff Size: Normal)   Pulse 98   Resp 16   Ht $R'5\' 9"'rV$  (1.753 m)   Wt 274 lb (124.3 kg)   SpO2 96%   BMI 40.46 kg/m  BP Readings from Last 3 Encounters:  06/27/22 123/85  03/07/22 127/86  07/19/21 111/78   Wt Readings from Last 3 Encounters:  06/27/22 274 lb (124.3 kg)  03/07/22 268 lb 8 oz (121.8 kg)  07/19/21 262 lb 12.8 oz (119.2 kg)   SpO2 Readings from Last 3 Encounters:  06/27/22 96%  03/07/22 100%  08/23/19 99%       Physical Exam Vitals and nursing note reviewed.  Constitutional:      General: She is awake. She is not in acute  distress.    Appearance: Normal appearance. She is well-developed and well-groomed. She is obese. She is not ill-appearing, toxic-appearing or diaphoretic.  HENT:     Head: Normocephalic and atraumatic.     Jaw: There is normal jaw occlusion. No trismus, tenderness, swelling or pain on movement.     Right Ear: Hearing, tympanic membrane, ear canal and external ear normal. There is no impacted cerumen.     Left Ear: Hearing, tympanic membrane, ear canal and external ear normal. There is no impacted cerumen.     Nose: Nose normal. No congestion or rhinorrhea.     Right Turbinates: Not enlarged, swollen or pale.     Left Turbinates: Not enlarged, swollen or pale.     Right Sinus: No maxillary sinus tenderness or frontal sinus tenderness.     Left Sinus: No maxillary sinus tenderness or frontal sinus tenderness.     Mouth/Throat:     Lips: Pink.     Mouth: Mucous membranes are moist. No injury.     Tongue: No lesions.     Pharynx: Oropharynx is clear. Uvula midline. No pharyngeal swelling, oropharyngeal exudate, posterior oropharyngeal erythema or uvula swelling.     Tonsils: No tonsillar exudate or tonsillar abscesses.  Eyes:     General: Lids are normal. Lids are everted, no foreign bodies appreciated. Vision grossly intact. Gaze aligned appropriately. No allergic shiner or visual field deficit.       Right eye: No discharge.        Left eye: No discharge.     Extraocular Movements: Extraocular movements intact.     Conjunctiva/sclera: Conjunctivae normal.     Right eye: Right conjunctiva is not injected. No exudate.    Left eye: Left conjunctiva is not injected. No exudate.  Pupils: Pupils are equal, round, and reactive to light.  Neck:     Thyroid: No thyroid mass, thyromegaly or thyroid tenderness.     Vascular: No carotid bruit.     Trachea: Trachea normal.  Cardiovascular:     Rate and Rhythm: Normal rate and regular rhythm.     Pulses: Normal pulses.          Carotid pulses  are 2+ on the right side and 2+ on the left side.      Radial pulses are 2+ on the right side and 2+ on the left side.       Dorsalis pedis pulses are 2+ on the right side and 2+ on the left side.       Posterior tibial pulses are 2+ on the right side and 2+ on the left side.     Heart sounds: Normal heart sounds, S1 normal and S2 normal. No murmur heard.    No friction rub. No gallop.  Pulmonary:     Effort: Pulmonary effort is normal. No respiratory distress.     Breath sounds: Normal breath sounds and air entry. No stridor. No wheezing, rhonchi or rales.  Chest:     Chest wall: No tenderness.     Comments: Breasts: risk and benefit of breast self-exam was discussed, not examined  Abdominal:     General: Abdomen is flat. Bowel sounds are normal. There is no distension.     Palpations: Abdomen is soft. There is no mass.     Tenderness: There is no abdominal tenderness. There is no right CVA tenderness, left CVA tenderness, guarding or rebound.     Hernia: No hernia is present.  Genitourinary:    Comments: Exam deferred; denies complaints Musculoskeletal:        General: No swelling, tenderness, deformity or signs of injury. Normal range of motion.     Cervical back: Full passive range of motion without pain, normal range of motion and neck supple. No edema, rigidity or tenderness. No muscular tenderness.     Right lower leg: No edema.     Left lower leg: No edema.  Lymphadenopathy:     Cervical: No cervical adenopathy.     Right cervical: No superficial, deep or posterior cervical adenopathy.    Left cervical: No superficial, deep or posterior cervical adenopathy.  Skin:    General: Skin is warm and dry.     Capillary Refill: Capillary refill takes less than 2 seconds.     Coloration: Skin is not jaundiced or pale.     Findings: No bruising, erythema, lesion or rash.  Neurological:     General: No focal deficit present.     Mental Status: She is alert and oriented to person,  place, and time. Mental status is at baseline.     GCS: GCS eye subscore is 4. GCS verbal subscore is 5. GCS motor subscore is 6.     Sensory: Sensation is intact. No sensory deficit.     Motor: Motor function is intact. No weakness.     Coordination: Coordination is intact. Coordination normal.     Gait: Gait is intact. Gait normal.  Psychiatric:        Attention and Perception: Attention and perception normal.        Mood and Affect: Mood and affect normal.        Speech: Speech normal.        Behavior: Behavior normal. Behavior is cooperative.  Thought Content: Thought content normal.        Cognition and Memory: Cognition and memory normal.        Judgment: Judgment normal.      Last depression screening scores    06/27/2022    4:07 PM 03/07/2022    2:04 PM 02/21/2020   10:36 AM  PHQ 2/9 Scores  PHQ - 2 Score 2 0 2  PHQ- 9 Score $Remov'7 5 6   'mdkBpp$ Last fall risk screening    06/27/2022    4:07 PM  [place] in the past year? 0  Number falls in past yr: 0  Injury with Fall? 0  Risk for fall due to : No Fall Risks  Follow up Falls evaluation completed   Last Audit-C alcohol use screening    06/27/2022    4:08 PM  Alcohol Use Disorder Test (AUDIT)  1. How often do you have a drink containing alcohol? 1  2. How many drinks containing alcohol do you have on a typical day when you are drinking? 0  3. How often do you have six or more drinks on one occasion? 0  AUDIT-C Score 1   A score of 3 or more in women, and 4 or more in men indicates increased risk for alcohol abuse, EXCEPT if all of the points are from question 1   No results found for any visits on 06/27/22.  Assessment & Plan    Routine Health Maintenance and Physical Exam  Exercise Activities and Dietary recommendations  Goals   None     Immunization History  Administered Date(s) Administered   Influenza,inj,Quad PF,6+ Mos 06/16/2019   MMR 08/23/2019   Tdap 06/30/2019    Health Maintenance   Topic Date Due   COVID-19 Vaccine (1) Never done   Hepatitis C Screening  Never done   INFLUENZA VACCINE  01/05/2023 (Originally 05/07/2022)   PAP SMEAR-Modifier  02/21/2023   TETANUS/TDAP  06/29/2029   HIV Screening  Completed   HPV VACCINES  Aged Out    Discussed health benefits of physical activity, and encouraged her to engage in regular exercise appropriate for her age and condition.  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  - Eat a low-fat diet with lots of fruits and vegetables, up to 7-9 servings per day.  - Seatbelts can save your life. Wear them always.  - Smoke detectors on every level of your home, check batteries every year.  - Eye Doctor - have an eye exam every 1-2 years  - Safe sex - if you may be exposed to STDs, use a condom.  - Alcohol -  If you drink, do it moderately, less than 2 drinks per day.  - [place]. Choose someone to speak for you if you are not able.  - Depression is common in our stressful world.If you're feeling down or losing interest in things you normally enjoy, please come in for a visit.  - Violence - If anyone is threatening or hurting you,  please call immediately.        Morbid obesity (HCC)    Chronic, stable Body mass index is 40.46 kg/m. Discussed importance of healthy weight management Discussed diet and exercise Trial of phen and wellbutrin to assist       Relevant Medications   buPROPion (WELLBUTRIN XL) 150 MG 24 hr tablet   phentermine 37.5 MG capsule     Return in about 6 months (around 12/26/2022) for chonic disease  management.     Vonna Kotyk, FNP, have reviewed all documentation for this visit. The documentation on 06/27/22 for the exam, diagnosis, procedures, and orders are all accurate and complete.    Gwyneth Sprout, [place] [phone] (phone) [phone] (fax)  [place]

## 2022-06-27 ENCOUNTER — Ambulatory Visit (INDEPENDENT_AMBULATORY_CARE_PROVIDER_SITE_OTHER): Payer: Managed Care, Other (non HMO) | Admitting: Family Medicine

## 2022-06-27 ENCOUNTER — Encounter: Payer: Self-pay | Admitting: Family Medicine

## 2022-06-27 VITALS — BP 123/85 | HR 98 | Resp 16 | Ht 69.0 in | Wt 274.0 lb

## 2022-06-27 DIAGNOSIS — Z0000 Encounter for general adult medical examination without abnormal findings: Secondary | ICD-10-CM | POA: Diagnosis not present

## 2022-06-27 HISTORY — DX: Encounter for general adult medical examination without abnormal findings: Z00.00

## 2022-06-27 MED ORDER — BUPROPION HCL ER (XL) 150 MG PO TB24: 150.0000 mg | ORAL_TABLET | Freq: Every day | ORAL | 2 refills | Status: DC

## 2022-06-27 MED ORDER — PHENTERMINE HCL 37.5 MG PO CAPS: 37.5000 mg | ORAL_CAPSULE | ORAL | 2 refills | Status: DC

## 2022-06-27 NOTE — Assessment & Plan Note (Signed)
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  - Eat a low-fat diet with lots of fruits and vegetables, up to 7-9 servings per day.  - Seatbelts can save your life. Wear them always.  - Smoke detectors on every level of your home, check batteries every year.  - Eye Doctor - have an eye exam every 1-2 years  - Safe sex - if you may be exposed to STDs, use a condom.  - Alcohol -  If you drink, do it moderately, less than 2 drinks per day.  - [place]. Choose someone to speak for you if you are not able.  - Depression is common in our stressful world.If you're feeling down or losing interest in things you normally enjoy, please come in for a visit.  - Violence - If anyone is threatening or hurting you, please call immediately.

## 2022-06-27 NOTE — Assessment & Plan Note (Signed)
Chronic, stable Body mass index is 40.46 kg/m. Discussed importance of healthy weight management Discussed diet and exercise Trial of phen and wellbutrin to assist

## 2022-07-15 ENCOUNTER — Encounter: Payer: Self-pay | Admitting: Family Medicine

## 2022-07-17 ENCOUNTER — Other Ambulatory Visit: Payer: Self-pay | Admitting: Family Medicine

## 2022-07-17 MED ORDER — PHENTERMINE HCL 37.5 MG PO CAPS: 37.5000 mg | ORAL_CAPSULE | ORAL | 0 refills | Status: DC

## 2022-07-24 ENCOUNTER — Ambulatory Visit: Payer: Managed Care, Other (non HMO) | Admitting: Neurology

## 2022-07-24 ENCOUNTER — Encounter: Payer: Self-pay | Admitting: Neurology

## 2022-08-17 ENCOUNTER — Other Ambulatory Visit: Payer: Self-pay | Admitting: Adult Health

## 2022-08-17 DIAGNOSIS — F422 Mixed obsessional thoughts and acts: Secondary | ICD-10-CM

## 2022-08-19 NOTE — Telephone Encounter (Signed)
Please call patient to schedule an appt.-

## 2022-09-06 NOTE — Telephone Encounter (Signed)
LVM to schedule f/u

## 2022-10-31 ENCOUNTER — Encounter: Payer: Self-pay | Admitting: Physician Assistant

## 2022-10-31 ENCOUNTER — Other Ambulatory Visit: Payer: Self-pay | Admitting: Physician Assistant

## 2022-10-31 ENCOUNTER — Ambulatory Visit: Payer: Managed Care, Other (non HMO) | Admitting: Physician Assistant

## 2022-10-31 VITALS — BP 115/88 | HR 86 | Temp 98.4°F | Ht 70.0 in | Wt 272.0 lb

## 2022-10-31 DIAGNOSIS — R112 Nausea with vomiting, unspecified: Secondary | ICD-10-CM

## 2022-10-31 DIAGNOSIS — K5900 Constipation, unspecified: Secondary | ICD-10-CM

## 2022-10-31 DIAGNOSIS — R12 Heartburn: Secondary | ICD-10-CM

## 2022-10-31 LAB — POCT URINALYSIS DIPSTICK
Bilirubin, UA: NEGATIVE
Glucose, UA: NEGATIVE
Ketones, UA: NEGATIVE
Leukocytes, UA: NEGATIVE
Nitrite, UA: NEGATIVE
Protein, UA: NEGATIVE
Spec Grav, UA: 1.02 (ref 1.010–1.025)
Urobilinogen, UA: NEGATIVE E.U./dL — AB
pH, UA: 6 (ref 5.0–8.0)

## 2022-10-31 LAB — POCT URINE PREGNANCY: Preg Test, Ur: NEGATIVE

## 2022-10-31 MED ORDER — OMEPRAZOLE 40 MG PO CPDR: 40.0000 mg | DELAYED_RELEASE_CAPSULE | Freq: Every day | ORAL | 0 refills | Status: DC

## 2022-10-31 NOTE — Progress Notes (Signed)
Established patient visit   Patient: Jill [name]   DOB: Sep 29, 1986   37 y.o. Female  MRN: [id number] Visit Date: 10/31/2022  Today's healthcare provider: Mardene Speak, PA-C  CC: vomiting and abdominal pain  Subjective     HPI   Pt stated--vomiting, stomach discomfort especially at night--2 months. Pt verified have an issue of acid reflux. Last edited by Elta Guadeloupe, CMA on 10/31/2022  4:07 PM.      Reports having vomiting x 2 mo, had an episode of vomiting last night and this morning, this weekend, has been vomiting ~ 2 times a day, she admits that she vomits bile, vomits on an empty stomach. Endorsees having epigastric pain/discomfort,  a burning feeling in one's chest, and regurgitating sour or bitter liquid to the throat or mouth, worse at late night 3-4 hours after eating  Has a hx of acid reflux Denies taking new drugs, having stress Denies having cp sob  Endorses having Constipation/1-2 BM per week?   Medications: Outpatient Medications Prior to Visit  Medication Sig   busPIRone (BUSPAR) 10 MG tablet Take 1 tablet (10 mg total) by mouth 2 (two) times daily.   cariprazine (VRAYLAR) 3 MG capsule Take 1 capsule (3 mg total) by mouth daily.   fluvoxaMINE (LUVOX) 100 MG tablet Take one tablet daily.   lamoTRIgine (LAMICTAL) 100 MG tablet Take 1 tablet (100 mg total) by mouth 2 (two) times daily.   Rimegepant Sulfate (NURTEC) 75 MG TBDP Take 75 mg by mouth as needed.   Vitamin D, Ergocalciferol, (DRISDOL) 1.25 MG (50000 UNIT) CAPS capsule Take 1 capsule (50,000 Units total) by mouth every 7 (seven) days.   phentermine 37.5 MG capsule Take 1 capsule (37.5 mg total) by mouth every morning. (Patient not taking: Reported on 10/31/2022)   rizatriptan (MAXALT-MLT) 10 MG disintegrating tablet Take 1 tablet (10 mg total) by mouth as needed for migraine. May repeat in 2 hours if needed (Patient not taking: Reported on 10/31/2022)   Semaglutide-Weight Management 0.5  MG/0.5ML SOAJ Inject 0.5 mg into the skin once a week. (Patient not taking: Reported on 10/31/2022)   terbinafine (LAMISIL) 250 MG tablet Take 1 tablet (250 mg total) by mouth daily. (Patient not taking: Reported on 10/31/2022)   [DISCONTINUED] buPROPion (WELLBUTRIN XL) 150 MG 24 hr tablet Take 1 tablet (150 mg total) by mouth daily.   No facility-administered medications prior to visit.    Review of Systems  All other systems reviewed and are negative.  Except see HPI    Objective    BP 115/88 (BP Location: Right Arm, Patient Position: Sitting, Cuff Size: Large)   Pulse 86   Temp 98.4 F (36.9 C)   Ht 5\' 10"  (1.778 m)   Wt 272 lb (123.4 kg)   LMP 10/03/2022   SpO2 96%   BMI 39.03 kg/m    Physical Exam Vitals reviewed.  Constitutional:      General: She is not in acute distress.    Appearance: Normal appearance. She is well-developed. She is not diaphoretic.  HENT:     Head: Normocephalic and atraumatic.     Mouth/Throat:     Pharynx: No posterior oropharyngeal erythema.  Eyes:     General: No scleral icterus.    Extraocular Movements: Extraocular movements intact.     Conjunctiva/sclera: Conjunctivae normal.     Pupils: Pupils are equal, round, and reactive to light.  Neck:     Thyroid: No thyromegaly.  Cardiovascular:  Rate and Rhythm: Normal rate and regular rhythm.     Pulses: Normal pulses.     Heart sounds: Normal heart sounds. No murmur heard. Pulmonary:     Effort: Pulmonary effort is normal. No respiratory distress.     Breath sounds: Normal breath sounds. No wheezing, rhonchi or rales.  Abdominal:     General: Abdomen is flat. Bowel sounds are normal. There is distension.     Palpations: Abdomen is soft. There is no mass.     Tenderness: There is no abdominal tenderness. There is no right CVA tenderness, left CVA tenderness, guarding or rebound.     Hernia: No hernia is present.  Musculoskeletal:     Cervical back: Normal range of motion and neck  supple.     Right lower leg: No edema.     Left lower leg: No edema.  Lymphadenopathy:     Cervical: No cervical adenopathy.  Skin:    General: Skin is warm and dry.     Findings: No rash.  Neurological:     Mental Status: She is alert and oriented to person, place, and time. Mental status is at baseline.  Psychiatric:        Mood and Affect: Mood normal.        Behavior: Behavior normal.        Thought Content: Thought content normal.        Judgment: Judgment normal.       No results found for any visits on 10/31/22.  Assessment & Plan     1. Nausea and vomiting, unspecified vomiting type X 2 mo 2. Heartburn Acute  problem Based on HPI, PE , could be GERD - Basic metabolic panel Advised to try a trial of PPI: - omeprazole (PRILOSEC) 40 MG capsule; Take 1 capsule (40 mg total) by mouth daily.  Dispense: 30 capsule; Refill: 0 Start with 1/2 tab and proceed with 1 tablet is it will be not helpful Denies hx of long-term NSAIDs use but if symptoms persist we might refer to GI and do h.pylori and ect Will FU in a mo  3. Constipation, unspecified constipation type Chronic High fiber diet advised including yogurt, prune juice and  If it would not be helpful try to add Metamucil PT instruction for high-fiber eating plan was provided for reference Will reassess in a mo  The patient was advised to call back or seek an in-person evaluation if the symptoms worsen or if the condition fails to improve as anticipated.  I discussed the assessment and treatment plan with the patient. The patient was provided an opportunity to ask questions and all were answered. The patient agreed with the plan and demonstrated an understanding of the instructions.  The entirety of the information documented in the History of Present Illness, Review of Systems and Physical Exam were personally obtained by me. Portions of this information were initially documented by the CMA and reviewed by me for  thoroughness and accuracy.  Mardene Speak, [facility], [place] [phone] (phone) [phone] (fax)

## 2022-11-24 ENCOUNTER — Encounter: Payer: Self-pay | Admitting: Family Medicine

## 2022-11-26 ENCOUNTER — Other Ambulatory Visit: Payer: Self-pay | Admitting: Family Medicine

## 2022-11-26 DIAGNOSIS — F332 Major depressive disorder, recurrent severe without psychotic features: Secondary | ICD-10-CM

## 2022-11-26 MED ORDER — TIRZEPATIDE-WEIGHT MANAGEMENT 2.5 MG/0.5ML ~~LOC~~ SOAJ: 2.5000 mg | SUBCUTANEOUS | 0 refills | Status: DC

## 2022-11-29 ENCOUNTER — Other Ambulatory Visit: Payer: Self-pay | Admitting: Physician Assistant

## 2022-11-29 DIAGNOSIS — R112 Nausea with vomiting, unspecified: Secondary | ICD-10-CM

## 2022-11-29 NOTE — Progress Notes (Unsigned)
I,Deeksha Cotrell R Enrique Weiss,acting as a Education administrator for Gwyneth Sprout, FNP.,have documented all relevant documentation on the behalf of Gwyneth Sprout, FNP,as directed by  Gwyneth Sprout, FNP while in the presence of Gwyneth Sprout, FNP.  Established patient visit  Patient: Jill [name]   DOB: Aug 09, 1986   37 y.o. Female  MRN: [id number] Visit Date: 12/02/2022  Today's healthcare provider: Gwyneth Sprout, FNP  Re Introduced to nurse practitioner role and practice setting.  All questions answered.  Discussed provider/patient relationship and expectations.  Subjective    HPI  Follow up for Vomiting  The patient was last seen for this 1 months ago. Changes made at last visit include  omeprazole (PRILOSEC) 40 MG capsule; Take 1 capsule (40 mg total) by mouth daily.  Dispense: 30 capsule; Refill: 0 Start with 1/2 tab and proceed with 1 tablet is it will be not helpful Denies hx of long-term NSAIDs use but if symptoms persist we might refer to GI and do h.pylori and ect. She reports excellent compliance with treatment. She feels that condition is Improved. She is not having side effects.  Patient states symptoms have completley resolved, no questions or concerns as today.   Patient would like to discuss weight management - requesting to restart phentermine 37.'5mg'$   -----------------------------------------------------------------------------------------   Medications: Outpatient Medications Prior to Visit  Medication Sig   omeprazole (PRILOSEC) 40 MG capsule TAKE 1 CAPSULE(40 MG) BY MOUTH DAILY   rizatriptan (MAXALT-MLT) 10 MG disintegrating tablet Take 1 tablet (10 mg total) by mouth as needed for migraine. May repeat in 2 hours if needed   terbinafine (LAMISIL) 250 MG tablet Take 1 tablet (250 mg total) by mouth daily.   Vitamin D, Ergocalciferol, (DRISDOL) 1.25 MG (50000 UNIT) CAPS capsule Take 1 capsule (50,000 Units total) by mouth every 7 (seven) days.   [DISCONTINUED] busPIRone (BUSPAR)  10 MG tablet Take 1 tablet (10 mg total) by mouth 2 (two) times daily.   [DISCONTINUED] cariprazine (VRAYLAR) 3 MG capsule Take 1 capsule (3 mg total) by mouth daily.   [DISCONTINUED] fluvoxaMINE (LUVOX) 100 MG tablet Take one tablet daily.   [DISCONTINUED] lamoTRIgine (LAMICTAL) 100 MG tablet Take 1 tablet (100 mg total) by mouth 2 (two) times daily.   [DISCONTINUED] Rimegepant Sulfate (NURTEC) 75 MG TBDP Take 75 mg by mouth as needed.   [DISCONTINUED] tirzepatide (ZEPBOUND) 2.5 MG/0.5ML Pen Inject 2.5 mg into the skin once a week.   [DISCONTINUED] phentermine 37.5 MG capsule Take 1 capsule (37.5 mg total) by mouth every morning. (Patient not taking: Reported on 10/31/2022)   [DISCONTINUED] Semaglutide-Weight Management 0.5 MG/0.5ML SOAJ Inject 0.5 mg into the skin once a week. (Patient not taking: Reported on 12/02/2022)   No facility-administered medications prior to visit.    Review of Systems    Objective    BP 121/83 (BP Location: Right Arm, Patient Position: Sitting, Cuff Size: Large)   Pulse 76   Temp 98.5 F (36.9 C) (Oral)   Ht '5\' 9"'$  (1.753 m)   Wt 268 lb (121.6 kg)   SpO2 99%   BMI 39.58 kg/m   Physical Exam Vitals and nursing note reviewed.  Constitutional:      General: She is not in acute distress.    Appearance: Normal appearance. She is obese. She is not ill-appearing, toxic-appearing or diaphoretic.  HENT:     Head: Normocephalic and atraumatic.  Cardiovascular:     Rate and Rhythm: Normal rate and regular rhythm.  Pulses: Normal pulses.     Heart sounds: Normal heart sounds. No murmur heard.    No friction rub. No gallop.  Pulmonary:     Effort: Pulmonary effort is normal. No respiratory distress.     Breath sounds: Normal breath sounds. No stridor. No wheezing, rhonchi or rales.  Chest:     Chest wall: No tenderness.  Musculoskeletal:        General: No swelling, tenderness, deformity or signs of injury. Normal range of motion.     Right lower leg: No  edema.     Left lower leg: No edema.  Skin:    General: Skin is warm and dry.     Capillary Refill: Capillary refill takes less than 2 seconds.     Coloration: Skin is not jaundiced or pale.     Findings: No bruising, erythema, lesion or rash.  Neurological:     General: No focal deficit present.     Mental Status: She is alert and oriented to person, place, and time. Mental status is at baseline.     Cranial Nerves: No cranial nerve deficit.     Sensory: No sensory deficit.     Motor: No weakness.     Coordination: Coordination normal.  Psychiatric:        Mood and Affect: Mood normal.        Behavior: Behavior normal.        Thought Content: Thought content normal.        Judgment: Judgment normal.     No results found for any visits on 12/02/22.  Assessment & Plan     Problem List Items Addressed This Visit       Cardiovascular and Mediastinum   Menstrual migraine without status migrainosus, not intractable    Chronic, improving Request for additional samples of nurtec to assist; additional 8 samples provided Defer blood work at this time Continue to monitor Recommend neurology f/u if acute, uncontrollable or chronic and worsening in frequency and/or duration       Relevant Medications   lamoTRIgine (LAMICTAL) 100 MG tablet   fluvoxaMINE (LUVOX) 100 MG tablet   Rimegepant Sulfate (NURTEC) 75 MG TBDP     Digestive   Gastroesophageal reflux disease with esophagitis without hemorrhage - Primary    Acute, stabilized Has used prilosec 40 mg to assist; defers refills at this time Continues to note an improvement in GERD s/s with change in diet as well as she is back on her weight mgmt plan         Other   Bipolar I disorder ([place])    Chronic, stable Wishes to continue all medications as previously prescribed Continues to work on balance between work/home relationships, son Maurene Capes is 79 yo. MIL and mother assist with childcare.      Relevant Medications    cariprazine (VRAYLAR) 3 MG capsule   lamoTRIgine (LAMICTAL) 100 MG tablet   phentermine 37.5 MG capsule   Class 2 severe obesity due to excess calories with serious comorbidity and body mass index (BMI) of 39.0 to 39.9 in adult [facility])    Chronic, improved Associated with Bipolar as well as OCD s/s Wishes to continue phentermine x 3 months to assist and augment diet and exercise lifestyle behaviors Rtc in 3 months for additional medications; weight check/BP check  Body mass index is 39.58 kg/m.       Relevant Medications   phentermine 37.5 MG capsule   OCD (obsessive compulsive disorder)   Relevant Medications  fluvoxaMINE (LUVOX) 100 MG tablet   phentermine 37.5 MG capsule   Return in about 3 months (around 03/02/2023) for chonic disease management.     Vonna Kotyk, FNP, have reviewed all documentation for this visit. The documentation on 12/02/22 for the exam, diagnosis, procedures, and orders are all accurate and complete.  Gwyneth Sprout, [place] [phone] (phone) [phone] (fax)  [place]

## 2022-11-29 NOTE — Telephone Encounter (Signed)
Requested medication (s) are due for refill today: yes  Requested medication (s) are on the active medication list: yes  Last refill:  10/31/22  #30  Future visit scheduled: yes  Notes to clinic:  Advised to try a trial of PPI: - omeprazole (PRILOSEC) 40 MG capsule; Take 1 capsule (40 mg total) by mouth daily.  Dispense: 30 capsule; Refill: 0 Start with 1/2 tab and proceed with 1 tablet is it will be not helpful Denies hx of long-term NSAIDs use but if symptoms persist we might refer to GI and do h.pylori and ect Will FU in a mo   Requested Prescriptions  Pending Prescriptions Disp Refills   omeprazole (PRILOSEC) 40 MG capsule [Pharmacy Med Name: OMEPRAZOLE '40MG'$  CAPSULES] 90 capsule     Sig: TAKE 1 CAPSULE(40 MG) BY MOUTH DAILY     Gastroenterology: Proton Pump Inhibitors Passed - 11/29/2022  3:36 AM      Passed - Valid encounter within last 12 months    Recent Outpatient Visits           4 weeks ago Nausea and vomiting, unspecified vomiting type   [facility] [place], [place], PA-C   5 months ago Annual physical exam   [facility] [place] Joe T, FNP   8 months ago Morbid obesity [facility])   [place] Gwyneth Sprout, FNP       Future Appointments             In 3 days Gwyneth Sprout, [place], [place]

## 2022-12-02 ENCOUNTER — Encounter: Payer: Self-pay | Admitting: Family Medicine

## 2022-12-02 ENCOUNTER — Ambulatory Visit (INDEPENDENT_AMBULATORY_CARE_PROVIDER_SITE_OTHER): Payer: Managed Care, Other (non HMO) | Admitting: Family Medicine

## 2022-12-02 VITALS — BP 121/83 | HR 76 | Temp 98.5°F | Ht 69.0 in | Wt 268.0 lb

## 2022-12-02 DIAGNOSIS — E66812 Obesity, class 2: Secondary | ICD-10-CM | POA: Insufficient documentation

## 2022-12-02 DIAGNOSIS — F422 Mixed obsessional thoughts and acts: Secondary | ICD-10-CM

## 2022-12-02 DIAGNOSIS — F319 Bipolar disorder, unspecified: Secondary | ICD-10-CM | POA: Diagnosis not present

## 2022-12-02 DIAGNOSIS — Z6839 Body mass index (BMI) 39.0-39.9, adult: Secondary | ICD-10-CM

## 2022-12-02 DIAGNOSIS — K2100 Gastro-esophageal reflux disease with esophagitis, without bleeding: Secondary | ICD-10-CM | POA: Diagnosis not present

## 2022-12-02 DIAGNOSIS — G43829 Menstrual migraine, not intractable, without status migrainosus: Secondary | ICD-10-CM

## 2022-12-02 MED ORDER — LAMOTRIGINE 100 MG PO TABS: 100.0000 mg | ORAL_TABLET | Freq: Two times a day (BID) | ORAL | 3 refills | Status: DC

## 2022-12-02 MED ORDER — PHENTERMINE HCL 37.5 MG PO CAPS: 37.5000 mg | ORAL_CAPSULE | ORAL | 0 refills | Status: DC

## 2022-12-02 MED ORDER — NURTEC 75 MG PO TBDP: 75.0000 mg | ORAL_TABLET | ORAL | 0 refills | Status: DC | PRN

## 2022-12-02 MED ORDER — FLUVOXAMINE MALEATE 100 MG PO TABS: ORAL_TABLET | ORAL | 3 refills | Status: DC

## 2022-12-02 MED ORDER — CARIPRAZINE HCL 3 MG PO CAPS: 3.0000 mg | ORAL_CAPSULE | Freq: Every day | ORAL | 3 refills | Status: DC

## 2022-12-02 NOTE — Assessment & Plan Note (Signed)
Chronic, improved Associated with Bipolar as well as OCD s/s Wishes to continue phentermine x 3 months to assist and augment diet and exercise lifestyle behaviors Rtc in 3 months for additional medications; weight check/BP check  Body mass index is 39.58 kg/m.

## 2022-12-02 NOTE — Assessment & Plan Note (Signed)
Chronic, stable Wishes to continue all medications as previously prescribed Continues to work on balance between work/home relationships, son Maurene Capes is 38 yo. MIL and mother assist with childcare.

## 2022-12-02 NOTE — Assessment & Plan Note (Signed)
Acute, stabilized Has used prilosec 40 mg to assist; defers refills at this time Continues to note an improvement in GERD s/s with change in diet as well as she is back on her weight mgmt plan

## 2022-12-02 NOTE — Assessment & Plan Note (Signed)
Chronic, improving Request for additional samples of nurtec to assist; additional 8 samples provided Defer blood work at this time Continue to monitor Recommend neurology f/u if acute, uncontrollable or chronic and worsening in frequency and/or duration

## 2023-01-01 ENCOUNTER — Other Ambulatory Visit: Payer: Self-pay | Admitting: Adult Health

## 2023-01-01 DIAGNOSIS — F319 Bipolar disorder, unspecified: Secondary | ICD-10-CM

## 2023-01-16 ENCOUNTER — Ambulatory Visit: Payer: Self-pay | Admitting: Adult Health

## 2023-03-05 ENCOUNTER — Ambulatory Visit: Payer: Managed Care, Other (non HMO) | Admitting: Family Medicine

## 2023-04-11 ENCOUNTER — Ambulatory Visit: Payer: Managed Care, Other (non HMO) | Admitting: Family Medicine

## 2023-06-23 ENCOUNTER — Ambulatory Visit (INDEPENDENT_AMBULATORY_CARE_PROVIDER_SITE_OTHER): Payer: Self-pay | Admitting: Adult Health

## 2023-06-23 ENCOUNTER — Encounter: Payer: Self-pay | Admitting: Family Medicine

## 2023-06-23 DIAGNOSIS — Z0389 Encounter for observation for other suspected diseases and conditions ruled out: Secondary | ICD-10-CM

## 2023-06-23 NOTE — Progress Notes (Signed)
Patient no show appointment. ? ?

## 2023-06-24 ENCOUNTER — Other Ambulatory Visit: Payer: Self-pay | Admitting: Family Medicine

## 2023-06-24 ENCOUNTER — Encounter: Payer: Self-pay | Admitting: Family Medicine

## 2023-06-24 MED ORDER — TIRZEPATIDE-WEIGHT MANAGEMENT 2.5 MG/0.5ML ~~LOC~~ SOAJ: 2.5000 mg | SUBCUTANEOUS | 0 refills | Status: DC

## 2023-06-25 ENCOUNTER — Telehealth: Payer: Self-pay

## 2023-06-25 NOTE — Telephone Encounter (Signed)
-----   Message from Jacky Kindle sent at 06/24/2023  3:35 PM EDT ----- Regarding: Zepbound Unable to complete PA as we do not have her pharmacy card on file; Bin #, Group #, PCN # etc

## 2023-10-15 ENCOUNTER — Telehealth: Payer: Self-pay | Admitting: Adult Health

## 2023-10-15 ENCOUNTER — Ambulatory Visit: Payer: 59 | Admitting: Adult Health

## 2023-10-15 ENCOUNTER — Encounter: Payer: Self-pay | Admitting: Adult Health

## 2023-10-15 DIAGNOSIS — F422 Mixed obsessional thoughts and acts: Secondary | ICD-10-CM | POA: Diagnosis not present

## 2023-10-15 DIAGNOSIS — F319 Bipolar disorder, unspecified: Secondary | ICD-10-CM | POA: Diagnosis not present

## 2023-10-15 DIAGNOSIS — F411 Generalized anxiety disorder: Secondary | ICD-10-CM | POA: Diagnosis not present

## 2023-10-15 DIAGNOSIS — Z0289 Encounter for other administrative examinations: Secondary | ICD-10-CM

## 2023-10-15 DIAGNOSIS — G4700 Insomnia, unspecified: Secondary | ICD-10-CM | POA: Diagnosis not present

## 2023-10-15 MED ORDER — LAMOTRIGINE 100 MG PO TABS: 100.0000 mg | ORAL_TABLET | Freq: Two times a day (BID) | ORAL | 1 refills | Status: DC

## 2023-10-15 MED ORDER — BUSPIRONE HCL 10 MG PO TABS: 10.0000 mg | ORAL_TABLET | Freq: Two times a day (BID) | ORAL | 1 refills | Status: DC

## 2023-10-15 MED ORDER — FLUVOXAMINE MALEATE 100 MG PO TABS: ORAL_TABLET | ORAL | 1 refills | Status: DC

## 2023-10-15 MED ORDER — CARIPRAZINE HCL 3 MG PO CAPS: 3.0000 mg | ORAL_CAPSULE | Freq: Every day | ORAL | 1 refills | Status: DC

## 2023-10-15 NOTE — Telephone Encounter (Signed)
 Pt brought in medical leave forms to be filled out. She paid for the forms to be done

## 2023-10-15 NOTE — Progress Notes (Signed)
 Jill [name] [id number] Feb 15, 1986 38 y.o.  Subjective:   Patient ID:  Jill [name] is a 38 y.o. (DOB Jun 23, 1986) female.  Chief Complaint: No chief complaint on file.   HPI Jill [name] presents to the office today for follow-up of MDD, BPD1, insomnia, and OCD.  Describes mood today as ok. Pleasant. Flat. Denies tearfulness - occasionally. Mood symptoms - reports anxiety at times. Denies depression and irritability. Denies panic attacks. Reports worry, rumination and over thinking. Denies mania. Reports mood as stable. Stating I feel like I'm doing alright. Feels like current medication regimen works well. Stable interest and motivation.Taking medications as prescribed.  Enery levels stable. Active, walking three times a week.  Enjoys some usual interests and activities. Married. Lives with husband and son. Family local. Sister lives in [place]. Appetite adequate. Weight loss - 255 pounds. Sleeping well most nights. Averages 6 to 8 hours. Focus and concentration good. Completing tasks. Managing some aspects of household. Working 8 hours a day - [employer]. Denies SI or HI.  Denies AH or VH. Denies paranoia. Denies self harm. Denies substance use.  Hospitalized in 2016 for paranoia, substance use, reckless behaviors, and overspending and was started on Geodon but had to stop due to costs.   Previous medication trials: Lamictal , Zoloft, Luvox , Geodon   GAD-7    Flowsheet Row Office Visit from 02/21/2020 in [facility]  Total GAD-7 Score 7      PHQ2-9    Flowsheet Row Office Visit from 12/02/2022 in [facility] Office Visit from 10/31/2022 in [facility] Office Visit from 06/27/2022 in [facility] Office Visit from 03/07/2022 in [facility] Office Visit from 02/21/2020 in [facility]  PHQ-2 Total Score 1 1 2  0 2  PHQ-9 Total  Score 1 3 7 5 6         Review of Systems:  Review of Systems  Musculoskeletal:  Negative for gait problem.  Neurological:  Negative for tremors.  Psychiatric/Behavioral:         Please refer to HPI    Medications: I have reviewed the patient's current medications.  Current Outpatient Medications  Medication Sig Dispense Refill   busPIRone  (BUSPAR ) 10 MG tablet Take 1 tablet (10 mg total) by mouth 2 (two) times daily. 180 tablet 1   tirzepatide  (ZEPBOUND ) 2.5 MG/0.5ML Pen Inject 2.5 mg into the skin once a week. 2 mL 0   cariprazine  (VRAYLAR ) 3 MG capsule Take 1 capsule (3 mg total) by mouth daily. 90 capsule 1   fluvoxaMINE  (LUVOX ) 100 MG tablet Take one tablet daily. 90 tablet 1   lamoTRIgine  (LAMICTAL ) 100 MG tablet Take 1 tablet (100 mg total) by mouth 2 (two) times daily. 180 tablet 1   omeprazole  (PRILOSEC) 40 MG capsule TAKE 1 CAPSULE(40 MG) BY MOUTH DAILY 90 capsule 0   phentermine  37.5 MG capsule Take 1 capsule (37.5 mg total) by mouth every morning. 90 capsule 0   Rimegepant Sulfate (NURTEC) 75 MG TBDP Take 1 tablet (75 mg total) by mouth as needed. 8 tablet 0   rizatriptan  (MAXALT -MLT) 10 MG disintegrating tablet Take 1 tablet (10 mg total) by mouth as needed for migraine. May repeat in 2 hours if needed 9 tablet 11   terbinafine  (LAMISIL ) 250 MG tablet Take 1 tablet (250 mg total) by mouth daily. 90 tablet 0   Vitamin D , Ergocalciferol , (DRISDOL ) 1.25 MG (50000 UNIT) CAPS capsule Take 1  capsule (50,000 Units total) by mouth every 7 (seven) days. 13 capsule 3   No current facility-administered medications for this visit.    Medication Side Effects: None  Allergies:  Allergies  Allergen Reactions   Amoxicillin Anaphylaxis, Shortness Of Breath and Rash    Childhood reaction  Has patient had a PCN reaction causing immediate rash, facial/tongue/throat swelling, SOB or lightheadedness with hypotension: Yes Has patient had a PCN reaction causing severe rash involving mucus  membranes or skin necrosis: Unknown Has patient had a PCN reaction that required hospitalization: No Has patient had a PCN reaction occurring within the last 10 years: No If all of the above answers are NO, then may proceed with Cephalosporin use.   Penicillins     Pt does not think that she is allergic to this medication but is not sure     Past Medical History:  Diagnosis Date   Annual physical exam 06/27/2022   Bipolar 1 disorder (HCC)    Depression    Migraine    OCD (obsessive compulsive disorder)    TMJ (dislocation of temporomandibular joint)     Past Medical History, Surgical history, Social history, and Family history were reviewed and updated as appropriate.   Please see review of systems for further details on the patient's review from today.   Objective:   Physical Exam:  There were no vitals taken for this visit.  Physical Exam Constitutional:      General: She is not in acute distress. Musculoskeletal:        General: No deformity.  Neurological:     Mental Status: She is alert and oriented to person, place, and time.     Coordination: Coordination normal.  Psychiatric:        Attention and Perception: Attention and perception normal. She does not perceive auditory or visual hallucinations.        Mood and Affect: Affect is not labile, blunt, angry or inappropriate.        Speech: Speech normal.        Behavior: Behavior normal.        Thought Content: Thought content normal. Thought content is not paranoid or delusional. Thought content does not include homicidal or suicidal ideation. Thought content does not include homicidal or suicidal plan.        Cognition and Memory: Cognition and memory normal.        Judgment: Judgment normal.     Comments: Insight intact     Lab Review:     Component Value Date/Time   NA 139 03/07/2022 1441   NA 139 01/31/2015 1329   K 4.3 03/07/2022 1441   K 3.7 01/31/2015 1329   CL 101 03/07/2022 1441   CL 107  01/31/2015 1329   CO2 21 03/07/2022 1441   CO2 26 01/31/2015 1329   GLUCOSE 123 (H) 03/07/2022 1441   GLUCOSE 85 08/19/2019 1922   GLUCOSE 86 01/31/2015 1329   BUN 11 03/07/2022 1441   BUN 9 01/31/2015 1329   CREATININE 0.92 03/07/2022 1441   CREATININE 0.63 01/31/2015 1329   CALCIUM  9.5 03/07/2022 1441   CALCIUM  8.8 (L) 01/31/2015 1329   PROT 7.4 03/07/2022 1441   PROT 7.6 01/31/2015 1329   ALBUMIN 4.3 03/07/2022 1441   ALBUMIN 3.8 01/31/2015 1329   AST 17 03/07/2022 1441   AST 24 01/31/2015 1329   ALT 15 03/07/2022 1441   ALT 19 01/31/2015 1329   ALKPHOS 102 03/07/2022 1441   ALKPHOS 73 01/31/2015  1329   BILITOT 0.4 03/07/2022 1441   BILITOT 0.7 01/31/2015 1329   GFRNONAA 96 04/27/2020 1019   GFRNONAA >60 01/31/2015 1329   GFRAA 111 04/27/2020 1019   GFRAA >60 01/31/2015 1329       Component Value Date/Time   WBC 12.3 (H) 03/07/2022 1441   WBC 17.0 (H) 08/22/2019 0741   RBC 5.19 03/07/2022 1441   RBC 3.56 (L) 08/22/2019 0741   HGB 14.2 03/07/2022 1441   HCT 41.3 03/07/2022 1441   PLT 434 03/07/2022 1441   MCV 80 03/07/2022 1441   MCV 85 01/31/2015 1329   MCH 27.4 03/07/2022 1441   MCH 28.4 08/22/2019 0741   MCHC 34.4 03/07/2022 1441   MCHC 32.9 08/22/2019 0741   RDW 12.7 03/07/2022 1441   RDW 13.4 01/31/2015 1329   LYMPHSABS 2.9 03/07/2022 1441   MONOABS 0.7 02/24/2018 0405   EOSABS 0.1 03/07/2022 1441   BASOSABS 0.1 03/07/2022 1441    No results found for: POCLITH, LITHIUM   No results found for: PHENYTOIN, PHENOBARB, VALPROATE, CBMZ   .res Assessment: Plan:    Plan:  PDMP reviewed  Lamictal  100mg  BID Vraylar  3mg   Buspar  10mg  BID   Luvox  100mg  daily  RTC 3 months  Will fill out paperwork for intermittent leave. Paper work given to staff for completion.   Patient advised to contact office with any questions, adverse effects, or acute worsening in signs and symptoms.  Counseled patient regarding potential benefits, risks, and  side effects of Lamictal  to include potential risk of Stevens-Johnson syndrome. Advised patient to stop taking Lamictal  and contact office immediately if rash develops and to seek urgent medical attention if rash is severe and/or spreading quickly.  Discussed potential metabolic side effects associated with atypical antipsychotics, as well as potential risk for movement side effects. Advised pt to contact office if movement side effects occur.  Diagnoses and all orders for this visit:  Bipolar I disorder (HCC) -     cariprazine  (VRAYLAR ) 3 MG capsule; Take 1 capsule (3 mg total) by mouth daily. -     lamoTRIgine  (LAMICTAL ) 100 MG tablet; Take 1 tablet (100 mg total) by mouth 2 (two) times daily.  Mixed obsessional thoughts and acts -     fluvoxaMINE  (LUVOX ) 100 MG tablet; Take one tablet daily. -     busPIRone  (BUSPAR ) 10 MG tablet; Take 1 tablet (10 mg total) by mouth 2 (two) times daily.  Generalized anxiety disorder  Insomnia, unspecified type     Please see After Visit Summary for patient specific instructions.  No future appointments.   No orders of the defined types were placed in this encounter.   -------------------------------

## 2023-10-28 NOTE — Telephone Encounter (Signed)
Forms completed and signed. Will fax today

## 2024-03-04 ENCOUNTER — Other Ambulatory Visit: Payer: Self-pay | Admitting: Family Medicine

## 2024-03-04 DIAGNOSIS — F319 Bipolar disorder, unspecified: Secondary | ICD-10-CM

## 2024-03-04 NOTE — Telephone Encounter (Signed)
 [facility] pharmacy faxed refill request for the following medications:    cariprazine  (VRAYLAR ) 3 MG capsule    Please advise

## 2024-03-05 NOTE — Telephone Encounter (Signed)
 Rx denied and patient reports she is aware this should be filled by psychiatry as that's where she thought the request was going. Advised patient to make sure pharmacy is aware of what office and provider the rx should be going to as it could be error on their end. She verbalized understanding

## 2024-03-31 DIAGNOSIS — Z0289 Encounter for other administrative examinations: Secondary | ICD-10-CM

## 2024-04-01 ENCOUNTER — Telehealth: Payer: Self-pay | Admitting: Adult Health

## 2024-04-01 NOTE — Telephone Encounter (Signed)
 Received FMLA ppwk to complete via fax. Pt paid. Put in Traci's box. Please fax by 7/6

## 2024-04-01 NOTE — Telephone Encounter (Signed)
 Next apt 7/8. Need to fax by 7/6 she said

## 2024-04-03 NOTE — Telephone Encounter (Signed)
 Paperwork renewal completed for intermittent leave only, expires on 04/13/24.  Pt is due every 3 months for medication management but last visit 10/15/23, next apt 04/13/24.

## 2024-04-06 NOTE — Telephone Encounter (Signed)
 Paperwork faxed to [employer]

## 2024-04-13 ENCOUNTER — Encounter: Payer: Self-pay | Admitting: Adult Health

## 2024-04-13 ENCOUNTER — Telehealth (INDEPENDENT_AMBULATORY_CARE_PROVIDER_SITE_OTHER): Payer: 59 | Admitting: Adult Health

## 2024-04-13 DIAGNOSIS — G4700 Insomnia, unspecified: Secondary | ICD-10-CM

## 2024-04-13 DIAGNOSIS — F422 Mixed obsessional thoughts and acts: Secondary | ICD-10-CM

## 2024-04-13 DIAGNOSIS — F411 Generalized anxiety disorder: Secondary | ICD-10-CM

## 2024-04-13 DIAGNOSIS — F319 Bipolar disorder, unspecified: Secondary | ICD-10-CM

## 2024-04-13 MED ORDER — BUSPIRONE HCL 10 MG PO TABS: 10.0000 mg | ORAL_TABLET | Freq: Two times a day (BID) | ORAL | 1 refills | Status: DC

## 2024-04-13 MED ORDER — LAMOTRIGINE 100 MG PO TABS: 100.0000 mg | ORAL_TABLET | Freq: Two times a day (BID) | ORAL | 1 refills | Status: DC

## 2024-04-13 MED ORDER — FLUVOXAMINE MALEATE 100 MG PO TABS: ORAL_TABLET | ORAL | 1 refills | Status: DC

## 2024-04-13 MED ORDER — CARIPRAZINE HCL 3 MG PO CAPS: 3.0000 mg | ORAL_CAPSULE | Freq: Every day | ORAL | 1 refills | Status: DC

## 2024-04-13 NOTE — Progress Notes (Signed)
 Jill [name] [id number] 11-Feb-1986 38 y.o.  Virtual Visit via Video Note  I connected with pt @ on 04/13/24 at  1:00 PM EDT by a video enabled telemedicine application and verified that I am speaking with the correct person using two identifiers.   I discussed the limitations of evaluation and management by telemedicine and the availability of in person appointments. The patient expressed understanding and agreed to proceed.  I discussed the assessment and treatment plan with the patient. The patient was provided an opportunity to ask questions and all were answered. The patient agreed with the plan and demonstrated an understanding of the instructions.   The patient was advised to call back or seek an in-person evaluation if the symptoms worsen or if the condition fails to improve as anticipated.  I provided 25 minutes of non-face-to-face time during this encounter.  The patient was located at home.  The provider was located at [facility].   Jill LOISE Sayers, NP   Subjective:   Patient ID:  Jill [name] is a 38 y.o. (DOB January 21, 1986) female.  Chief Complaint: No chief complaint on file.   HPI Jill [name] presents for follow-up of GAD, BPD1, insomnia, and OCD.  Describes mood today as ok. Pleasant. Flat. Denies tearfulness - occasionally. Mood symptoms - reports anxiety - 2 to 3 times a week - stressors in the work setting. Reports decreased depression and irritability. Reports stable interest and motivation. Reports one recent panic attack. Denies worry and rumination. Reports  over thinking - mostly surrounding work. Denies mania. Reports mood as stable. Stating overall, I feel like I'm doing ok. Feels like current medication regimen works well. Taking medications as prescribed.  Enery levels stable. Active, walking three times a week.  Enjoys some usual interests and activities. Married. Lives with husband and son. Family local. Sister lives  in [place]. Appetite adequate. Weight loss 60 pounds - 225 pounds. Sleeping well most nights. Averages 6 to 8 hours. Focus and concentration good. Completing tasks. Managing some aspects of household. Working 8 hours a day - [employer]. Denies SI or HI.  Denies AH or VH. Denies paranoia. Denies self harm. Denies substance use.  Hospitalized in 2016 for paranoia, substance use, reckless behaviors, and overspending and was started on Geodon but had to stop due to costs.   Previous medication trials: Lamictal , Zoloft, Luvox , Geodon  Review of Systems:  Review of Systems  Musculoskeletal:  Negative for gait problem.  Neurological:  Negative for tremors.  Psychiatric/Behavioral:         Please refer to HPI    Medications: I have reviewed the patient's current medications.  Current Outpatient Medications  Medication Sig Dispense Refill   tirzepatide  (ZEPBOUND ) 2.5 MG/0.5ML Pen Inject 2.5 mg into the skin once a week. 2 mL 0   busPIRone  (BUSPAR ) 10 MG tablet Take 1 tablet (10 mg total) by mouth 2 (two) times daily. 180 tablet 1   cariprazine  (VRAYLAR ) 3 MG capsule Take 1 capsule (3 mg total) by mouth daily. 90 capsule 1   fluvoxaMINE  (LUVOX ) 100 MG tablet Take one tablet daily. 90 tablet 1   lamoTRIgine  (LAMICTAL ) 100 MG tablet Take 1 tablet (100 mg total) by mouth 2 (two) times daily. 180 tablet 1   omeprazole  (PRILOSEC) 40 MG capsule TAKE 1 CAPSULE(40 MG) BY MOUTH DAILY 90 capsule 0   phentermine  37.5 MG capsule Take 1 capsule (37.5 mg total) by mouth every morning. 90 capsule 0   Rimegepant Sulfate (NURTEC)  75 MG TBDP Take 1 tablet (75 mg total) by mouth as needed. 8 tablet 0   rizatriptan  (MAXALT -MLT) 10 MG disintegrating tablet Take 1 tablet (10 mg total) by mouth as needed for migraine. May repeat in 2 hours if needed 9 tablet 11   terbinafine  (LAMISIL ) 250 MG tablet Take 1 tablet (250 mg total) by mouth daily. 90 tablet 0   Vitamin D , Ergocalciferol , (DRISDOL ) 1.25 MG (50000  UNIT) CAPS capsule Take 1 capsule (50,000 Units total) by mouth every 7 (seven) days. 13 capsule 3   No current facility-administered medications for this visit.    Medication Side Effects: None Allergies:  Allergies  Allergen Reactions   Amoxicillin Anaphylaxis, Shortness Of Breath and Rash    Childhood reaction  Has patient had a PCN reaction causing immediate rash, facial/tongue/throat swelling, SOB or lightheadedness with hypotension: Yes Has patient had a PCN reaction causing severe rash involving mucus membranes or skin necrosis: Unknown Has patient had a PCN reaction that required hospitalization: No Has patient had a PCN reaction occurring within the last 10 years: No If all of the above answers are NO, then may proceed with Cephalosporin use.   Penicillins     Pt does not think that she is allergic to this medication but is not sure     Past Medical History:  Diagnosis Date   Annual physical exam 06/27/2022   Bipolar 1 disorder (HCC)    Depression    Migraine    OCD (obsessive compulsive disorder)    TMJ (dislocation of temporomandibular joint)     Family History  Problem Relation Age of Onset   Depression Mother    Alcohol abuse Father    Lung cancer Maternal Grandmother    Hypertension Maternal Grandmother    Hypertension Maternal Grandfather    Stroke Maternal Grandfather    Hypertension Paternal Grandmother    Headache Paternal Grandmother    Migraines Paternal Grandmother    Hypertension Paternal Grandfather    Breast cancer Neg Hx     Social History   Socioeconomic History   Marital status: Married    Spouse name: Jill [name]   Number of children: Not on file   Years of education: Not on file   Highest education level: Not on file  Occupational History   Occupation: Lab assisstant  Tobacco Use   Smoking status: Never   Smokeless tobacco: Never  Vaping Use   Vaping status: Never Used  Substance and Sexual Activity   Alcohol use: No     Alcohol/week: 0.0 standard drinks of alcohol   Drug use: No   Sexual activity: Yes    Partners: Male    Birth control/protection: None  Other Topics Concern   Not on file  Social History Narrative   She is originally from [place] and graduated high school in [place]. She has been married for approximately 2years and has no children. She has been working for [employer] for the past one month. She currently lives with her husband. She denies any physical or sexual abuse.   Social Drivers of Corporate investment banker Strain: Not on file  Food Insecurity: Not on file  Transportation Needs: Not on file  Physical Activity: Not on file  Stress: Not on file  Social Connections: Not on file  Intimate Partner Violence: Not on file    Past Medical History, Surgical history, Social history, and Family history were reviewed and updated as appropriate.   Please see review of systems for further  details on the patient's review from today.   Objective:   Physical Exam:  There were no vitals taken for this visit.  Physical Exam Constitutional:      General: She is not in acute distress. Musculoskeletal:        General: No deformity.  Neurological:     Mental Status: She is alert and oriented to person, place, and time.     Coordination: Coordination normal.  Psychiatric:        Attention and Perception: Attention and perception normal. She does not perceive auditory or visual hallucinations.        Mood and Affect: Mood normal. Mood is not anxious or depressed. Affect is not labile, blunt, angry or inappropriate.        Speech: Speech normal.        Behavior: Behavior normal.        Thought Content: Thought content normal. Thought content is not paranoid or delusional. Thought content does not include homicidal or suicidal ideation. Thought content does not include homicidal or suicidal plan.        Cognition and Memory: Cognition and memory normal.        Judgment: Judgment normal.      Comments: Insight intact     Lab Review:     Component Value Date/Time   NA 139 03/07/2022 1441   NA 139 01/31/2015 1329   K 4.3 03/07/2022 1441   K 3.7 01/31/2015 1329   CL 101 03/07/2022 1441   CL 107 01/31/2015 1329   CO2 21 03/07/2022 1441   CO2 26 01/31/2015 1329   GLUCOSE 123 (H) 03/07/2022 1441   GLUCOSE 85 08/19/2019 1922   GLUCOSE 86 01/31/2015 1329   BUN 11 03/07/2022 1441   BUN 9 01/31/2015 1329   CREATININE 0.92 03/07/2022 1441   CREATININE 0.63 01/31/2015 1329   CALCIUM  9.5 03/07/2022 1441   CALCIUM  8.8 (L) 01/31/2015 1329   PROT 7.4 03/07/2022 1441   PROT 7.6 01/31/2015 1329   ALBUMIN 4.3 03/07/2022 1441   ALBUMIN 3.8 01/31/2015 1329   AST 17 03/07/2022 1441   AST 24 01/31/2015 1329   ALT 15 03/07/2022 1441   ALT 19 01/31/2015 1329   ALKPHOS 102 03/07/2022 1441   ALKPHOS 73 01/31/2015 1329   BILITOT 0.4 03/07/2022 1441   BILITOT 0.7 01/31/2015 1329   GFRNONAA 96 04/27/2020 1019   GFRNONAA >60 01/31/2015 1329   GFRAA 111 04/27/2020 1019   GFRAA >60 01/31/2015 1329       Component Value Date/Time   WBC 12.3 (H) 03/07/2022 1441   WBC 17.0 (H) 08/22/2019 0741   RBC 5.19 03/07/2022 1441   RBC 3.56 (L) 08/22/2019 0741   HGB 14.2 03/07/2022 1441   HCT 41.3 03/07/2022 1441   PLT 434 03/07/2022 1441   MCV 80 03/07/2022 1441   MCV 85 01/31/2015 1329   MCH 27.4 03/07/2022 1441   MCH 28.4 08/22/2019 0741   MCHC 34.4 03/07/2022 1441   MCHC 32.9 08/22/2019 0741   RDW 12.7 03/07/2022 1441   RDW 13.4 01/31/2015 1329   LYMPHSABS 2.9 03/07/2022 1441   MONOABS 0.7 02/24/2018 0405   EOSABS 0.1 03/07/2022 1441   BASOSABS 0.1 03/07/2022 1441    No results found for: POCLITH, LITHIUM   No results found for: PHENYTOIN, PHENOBARB, VALPROATE, CBMZ   .res Assessment: Plan:    Plan:  PDMP reviewed  Lamictal  100mg  BID Vraylar  3mg   Buspar  10mg  BID   Luvox  100mg  daily  RTC 3 months  25 minutes spent dedicated to the care of this  patient on the date of this encounter to include pre-visit review of records, ordering of medication, post visit documentation, and face-to-face time with the patient discussing GAD, BPD1, insomnia and OCD. Discussed continuing current medication regimen.  Approved for intermittent FMLA - renews in January.  Patient advised to contact office with any questions, adverse effects, or acute worsening in signs and symptoms.  Counseled patient regarding potential benefits, risks, and side effects of Lamictal  to include potential risk of Stevens-Johnson syndrome. Advised patient to stop taking Lamictal  and contact office immediately if rash develops and to seek urgent medical attention if rash is severe and/or spreading quickly.  Discussed potential metabolic side effects associated with atypical antipsychotics, as well as potential risk for movement side effects. Advised pt to contact office if movement side effects occur.   There are no diagnoses linked to this encounter.   Please see After Visit Summary for patient specific instructions.  Future Appointments  Date Time Provider Department Center  04/13/2024  1:00 PM Stran Raper Nattalie, NP [facility] None    No orders of the defined types were placed in this encounter.     -------------------------------

## 2024-04-21 ENCOUNTER — Other Ambulatory Visit: Payer: Self-pay | Admitting: Adult Health

## 2024-04-21 DIAGNOSIS — F319 Bipolar disorder, unspecified: Secondary | ICD-10-CM

## 2024-05-18 ENCOUNTER — Encounter: Payer: Self-pay | Admitting: Obstetrics & Gynecology

## 2024-05-18 ENCOUNTER — Ambulatory Visit (INDEPENDENT_AMBULATORY_CARE_PROVIDER_SITE_OTHER): Payer: Self-pay | Admitting: Obstetrics & Gynecology

## 2024-05-18 VITALS — BP 107/71 | HR 78 | Ht 70.0 in | Wt 232.0 lb

## 2024-05-18 DIAGNOSIS — Z01419 Encounter for gynecological examination (general) (routine) without abnormal findings: Secondary | ICD-10-CM | POA: Diagnosis not present

## 2024-05-18 DIAGNOSIS — Z124 Encounter for screening for malignant neoplasm of cervix: Secondary | ICD-10-CM

## 2024-05-18 NOTE — Progress Notes (Signed)
 GYNECOLOGY ANNUAL PHYSICAL EXAM PROGRESS NOTE  Subjective:    Jill [name] is a 38 y.o. married  [phone] (18 yo son) who presents for an annual exam. She is new to this practice. She previously saw Dr. Leonce at [facility].  The patient is sexually active. The patient participates in regular exercise: no. Has the patient ever been transfused or tattooed?: yes. The patient reports that there is not domestic violence in her life.   The patient has the following complaints today: She needs a pap smear. She uses condoms for contraception, doesn't want anything different.  Menstrual History: Menarche age: 35 Patient's last menstrual period was 05/05/2024 (exact date). Period Cycle (Days): 28 Period Duration (Days): 4-5 Period Pattern: Regular Menstrual Flow: Light, Moderate, Heavy Dysmenorrhea: (!) Moderate Dysmenorrhea Symptoms: Cramping   Gynecologic History:  Contraception: condoms History of STI's:  Last Pap: 2021. Results were: normal.  Denies h/o abnormal pap smears.    Upstream - 05/18/24 1316       Pregnancy Intention Screening   Does the patient want to become pregnant in the next year? No    Does the patient's partner want to become pregnant in the next year? No    Would the patient like to discuss contraceptive options today? No      Contraception Wrap Up   Current Method Female Condom    End Method Female Condom    Contraception Counseling Provided Yes            OB History  Gravida Para Term Preterm AB Living  4 1 1  0 3 1  SAB IAB Ectopic Multiple Live Births  3 0 0 0 1    # Outcome Date GA Lbr Len/2nd Weight Sex Type Anes PTL Lv  4 Term 08/21/19 [redacted]w[redacted]d / 05:34 7 lb 7.2 oz (3.38 kg) M CS-LTranv EPI  LIV     Name: Santellan,PENDINGBABY     Apgar1: 2  Apgar5: 8  3 SAB 03/2018          2 SAB 2010          1 SAB 2005            Past Medical History:  Diagnosis Date   Annual physical exam 06/27/2022   Bipolar 1 disorder (HCC)    Depression     Migraine    OCD (obsessive compulsive disorder)    TMJ (dislocation of temporomandibular joint)     Past Surgical History:  Procedure Laterality Date   CESAREAN SECTION N/A 08/21/2019   Procedure: CESAREAN SECTION;  Surgeon: Leonce Garnette BIRCH, MD;  Location: [facility] ORS;  Service: Obstetrics;  Laterality: N/A;   DILATION AND EVACUATION N/A 03/03/2018   Procedure: DILATATION AND EVACUATION;  Surgeon: Arloa Lamar SQUIBB, MD;  Location: [facility] OR[facility];  Service: Gynecology;  Laterality: N/A;   LAPAROSCOPIC LYSIS OF ADHESIONS  03/21/2017   Procedure: LAPAROSCOPIC LYSIS OF ADHESIONS;  Surgeon: Leonce Garnette BIRCH, MD;  Location: [facility] OR[facility];  Service: Gynecology;;   LAPAROSCOPY N/A 03/21/2017   Procedure: LAPAROSCOPY DIAGNOSTIC/FULGERATION OF ENDOMETRIAL IMPLANTS;  Surgeon: Leonce Garnette BIRCH, MD;  Location: [facility];  Service: Gynecology;  Laterality: N/A;    Family History  Problem Relation Age of Onset   Depression Mother    Alcohol abuse Father    Lung cancer Maternal Grandmother    Hypertension Maternal Grandmother    Hypertension Maternal Grandfather    Stroke Maternal Grandfather    Hypertension Paternal Grandmother    Headache Paternal Grandmother  Migraines Paternal Grandmother    Hypertension Paternal Grandfather    Breast cancer Neg Hx     Social History   Socioeconomic History   Marital status: Married    Spouse name: Bernardino   Number of children: Not on file   Years of education: Not on file   Highest education level: Not on file  Occupational History   Occupation: Lab assisstant  Tobacco Use   Smoking status: Never   Smokeless tobacco: Never  Vaping Use   Vaping status: Never Used  Substance and Sexual Activity   Alcohol use: No    Alcohol/week: 0.0 standard drinks of alcohol   Drug use: No   Sexual activity: Yes    Partners: Male    Birth control/protection: None, Condom  Other Topics Concern   Not on file  Social History Narrative   She is originally from  [place] and graduated high school in [place]. She has been married for approximately 2years and has no children. She has been working for [employer] for the past one month. She currently lives with her husband. She denies any physical or sexual abuse.   Social Drivers of Corporate investment banker Strain: Not on file  Food Insecurity: Not on file  Transportation Needs: Not on file  Physical Activity: Not on file  Stress: Not on file  Social Connections: Not on file  Intimate Partner Violence: Not on file    Current Outpatient Medications on File Prior to Visit  Medication Sig Dispense Refill   busPIRone  (BUSPAR ) 10 MG tablet Take 1 tablet (10 mg total) by mouth 2 (two) times daily. 180 tablet 1   cariprazine  (VRAYLAR ) 3 MG capsule Take 1 capsule (3 mg total) by mouth daily. 90 capsule 1   fluvoxaMINE  (LUVOX ) 100 MG tablet Take one tablet daily. 90 tablet 1   lamoTRIgine  (LAMICTAL ) 100 MG tablet Take 1 tablet (100 mg total) by mouth 2 (two) times daily. 180 tablet 1   ZEPBOUND  7.5 MG/0.5ML Pen SMARTSIG:7.5 Milligram(s) SUB-Q Once a Week     No current facility-administered medications on file prior to visit.    Allergies  Allergen Reactions   Amoxicillin Anaphylaxis, Shortness Of Breath and Rash    Childhood reaction  Has patient had a PCN reaction causing immediate rash, facial/tongue/throat swelling, SOB or lightheadedness with hypotension: Yes Has patient had a PCN reaction causing severe rash involving mucus membranes or skin necrosis: Unknown Has patient had a PCN reaction that required hospitalization: No Has patient had a PCN reaction occurring within the last 10 years: No If all of the above answers are NO, then may proceed with Cephalosporin use.   Penicillins     Pt does not think that she is allergic to this medication but is not sure      Review of Systems  Constitutional: negative for chills, fatigue, fevers and sweats Eyes: negative for irritation, redness  and visual disturbance Ears, nose, mouth, throat, and face: negative for hearing loss, nasal congestion, snoring and tinnitus Respiratory: negative for asthma, cough, sputum Cardiovascular: negative for chest pain, dyspnea, exertional chest pressure/discomfort, irregular heart beat, palpitations and syncope Gastrointestinal: negative for abdominal pain, change in bowel habits, nausea and vomiting Genitourinary: negative for abnormal menstrual periods, genital lesions, sexual problems and vaginal discharge, dysuria and urinary incontinence Integument/breast: negative for breast lump, breast tenderness and nipple discharge Hematologic/lymphatic: negative for bleeding and easy bruising Musculoskeletal:negative for back pain and muscle weakness Neurological: negative for dizziness, headaches, vertigo and weakness Endocrine:  negative for diabetic symptoms including polydipsia, polyuria and skin dryness Allergic/Immunologic: negative for hay fever and urticaria     She has been monogamous for 18 years. She works for [employer]. (9 years)   Objective:  Blood pressure 107/71, pulse 78, height 5' 10 (1.778 m), weight 232 lb (105.2 kg), last menstrual period 05/05/2024. Body mass index is 33.29 kg/m.    General Appearance:    Alert, cooperative, no distress, appears stated age  Head:    Normocephalic, without obvious abnormality, atraumatic  Eyes:    PERRL, conjunctiva/corneas clear, EOM's intact, both eyes  Ears:    Normal external ear canals, both ears  Nose:   Nares normal, septum midline, mucosa normal, no drainage or sinus tenderness  Throat:   Lips, mucosa, and tongue normal; teeth and gums normal  Neck:   Supple, symmetrical, trachea midline, no adenopathy; thyroid: no enlargement/tenderness/nodules; no carotid bruit or JVD  Back:     Symmetric, no curvature, ROM normal, no CVA tenderness  Lungs:     Clear to auscultation bilaterally, respirations unlabored  Chest Wall:    No tenderness or  deformity   Heart:    Regular rate and rhythm, S1 and S2 normal, no murmur, rub or gallop  Breast Exam:    No tenderness, masses, or nipple abnormality  Abdomen:     Soft, non-tender, bowel sounds active all four quadrants, no masses, no organomegaly.    Genitalia:    Pelvic:external genitalia normal, vagina without lesions, discharge, or tenderness, rectovaginal septum  normal. Cervix normal in appearance, no cervical motion tenderness, no adnexal masses or tenderness.  Uterus normal size, shape, anteverted, mobile, regular contours, nontender.     Extremities:   Extremities normal, atraumatic, no cyanosis or edema  Pulses:   2+ and symmetric all extremities  Skin:   Skin color, texture, turgor normal, no rashes or lesions  Lymph nodes:   Cervical, supraclavicular, and axillary nodes normal  Neurologic:   CNII-XII intact, normal strength, sensation and reflexes throughout   .  Labs:  Lab Results  Component Value Date   WBC 12.3 (H) 03/07/2022   HGB 14.2 03/07/2022   HCT 41.3 03/07/2022   MCV 80 03/07/2022   PLT 434 03/07/2022    Lab Results  Component Value Date   CREATININE 0.92 03/07/2022   BUN 11 03/07/2022   NA 139 03/07/2022   K 4.3 03/07/2022   CL 101 03/07/2022   CO2 21 03/07/2022    Lab Results  Component Value Date   ALT 15 03/07/2022   AST 17 03/07/2022   ALKPHOS 102 03/07/2022   BILITOT 0.4 03/07/2022    Lab Results  Component Value Date   TSH 1.900 03/07/2022     Assessment:  Well woman  Plan:   Breast self exam technique reviewed and patient encouraged to perform self-exam monthly. Contraception: condoms. Discussed healthy lifestyle modifications. Pap smear ordered.  Follow up in 1 year for annual exam   Starla Harland BROCKS, MD [facility]

## 2024-05-20 LAB — IGP,RFX APTIMA HPV ALL PTH

## 2024-08-25 ENCOUNTER — Emergency Department
Admission: EM | Admit: 2024-08-25 | Discharge: 2024-08-25 | Disposition: A | Discharge status: Home / Self Care | Attending: Emergency Medicine | Admitting: Emergency Medicine

## 2024-08-25 ENCOUNTER — Other Ambulatory Visit: Payer: Self-pay

## 2024-08-25 ENCOUNTER — Ambulatory Visit: Payer: Self-pay

## 2024-08-25 ENCOUNTER — Emergency Department

## 2024-08-25 DIAGNOSIS — Y990 Civilian activity done for income or pay: Secondary | ICD-10-CM | POA: Insufficient documentation

## 2024-08-25 DIAGNOSIS — R0789 Other chest pain: Secondary | ICD-10-CM | POA: Diagnosis present

## 2024-08-25 DIAGNOSIS — X58XXXA Exposure to other specified factors, initial encounter: Secondary | ICD-10-CM | POA: Insufficient documentation

## 2024-08-25 DIAGNOSIS — S20212A Contusion of left front wall of thorax, initial encounter: Secondary | ICD-10-CM | POA: Diagnosis not present

## 2024-08-25 DIAGNOSIS — S298XXA Other specified injuries of thorax, initial encounter: Secondary | ICD-10-CM

## 2024-08-25 DIAGNOSIS — R55 Syncope and collapse: Secondary | ICD-10-CM | POA: Insufficient documentation

## 2024-08-25 LAB — CBC
HCT: 42 % (ref 36.0–46.0)
Hemoglobin: 13.9 g/dL (ref 12.0–15.0)
MCH: 27.7 pg (ref 26.0–34.0)
MCHC: 33.1 g/dL (ref 30.0–36.0)
MCV: 83.7 fL (ref 80.0–100.0)
Platelets: 370 K/uL (ref 150–400)
RBC: 5.02 MIL/uL (ref 3.87–5.11)
RDW: 13.1 % (ref 11.5–15.5)
WBC: 8.8 K/uL (ref 4.0–10.5)
nRBC: 0 % (ref 0.0–0.2)

## 2024-08-25 LAB — POC URINE PREG, ED: Preg Test, Ur: NEGATIVE

## 2024-08-25 LAB — COMPREHENSIVE METABOLIC PANEL WITH GFR
ALT: 9 U/L (ref 0–44)
AST: 19 U/L (ref 15–41)
Albumin: 4.3 g/dL (ref 3.5–5.0)
Alkaline Phosphatase: 64 U/L (ref 38–126)
Anion gap: 10 (ref 5–15)
BUN: 9 mg/dL (ref 6–20)
CO2: 24 mmol/L (ref 22–32)
Calcium: 9 mg/dL (ref 8.9–10.3)
Chloride: 103 mmol/L (ref 98–111)
Creatinine, Ser: 0.75 mg/dL (ref 0.44–1.00)
GFR, Estimated: >60 mL/min (ref >60)
Glucose, Bld: 84 mg/dL (ref 70–99)
Potassium: 4.1 mmol/L (ref 3.5–5.1)
Sodium: 137 mmol/L (ref 135–145)
Total Bilirubin: 0.5 mg/dL (ref 0.0–1.2)
Total Protein: 7.7 g/dL (ref 6.5–8.1)

## 2024-08-25 MED ORDER — LIDOCAINE 5 % EX PTCH
1.0000 | MEDICATED_PATCH | Freq: Once | CUTANEOUS | Status: DC
  Administered 2024-08-25: 1 via TRANSDERMAL
  Filled 2024-08-25: qty 1

## 2024-08-25 MED ORDER — ACETAMINOPHEN 500 MG PO TABS
1000.0000 mg | ORAL_TABLET | Freq: Once | ORAL | Status: AC
  Administered 2024-08-25: 1000 mg via ORAL
  Filled 2024-08-25: qty 2

## 2024-08-25 NOTE — ED Provider Notes (Signed)
 [facility] Provider Note    Event Date/Time   First MD Initiated Contact with Patient 08/25/24 1116     (approximate)   History   Loss of Consciousness   HPI  Jill [name] is a 38 y.o. female no significant past medical history presents to the emergency department following a syncopal episode.  States that yesterday when she was at work she stood up and started walking down the hall and started to feel lightheaded and like she was going to pass out, attempted to grab onto something but then passed out landed on her left side.  Colie returned back to baseline.  No incontinence.  Today has been had some mild lightheadedness, pain to the left chest wall and she was concerned she had a rib fracture.  Mild headache.  Denies any significant or sudden onset of headache.  Not the worst headache of her life.  No neck pain, change in vision or extremity numbness or weakness.  No preceding chest pain or shortness of breath.  No abdominal pain.  Does not believe that she is currently pregnant.  No dysuria, urinary urgency or frequency.  No recent changes to any home medications.  No history of DVT or PE.     Physical Exam   Triage Vital Signs: ED Triage Vitals  Encounter Vitals Group     BP 08/25/24 1020 (!) 129/93     Girls Systolic BP Percentile --      Girls Diastolic BP Percentile --      Boys Systolic BP Percentile --      Boys Diastolic BP Percentile --      Pulse Rate 08/25/24 1020 73     Resp 08/25/24 1020 18     Temp 08/25/24 1020 98.3 F (36.8 C)     Temp Source 08/25/24 1020 Oral     SpO2 08/25/24 1020 100 %     Weight 08/25/24 1020 204 lb (92.5 kg)     Height 08/25/24 1020 5' 10 (1.778 m)     Head Circumference --      Peak Flow --      Pain Score 08/25/24 1021 7     Pain Loc --      Pain Education --      Exclude from Growth Chart --     Most recent vital signs: Vitals:   08/25/24 1020  BP: (!) 129/93  Pulse: 73  Resp: 18  Temp:  98.3 F (36.8 C)  SpO2: 100%    Physical Exam Constitutional:      Appearance: She is well-developed.  HENT:     Head: Atraumatic.  Eyes:     Conjunctiva/sclera: Conjunctivae normal.  Cardiovascular:     Rate and Rhythm: Regular rhythm.  Pulmonary:     Effort: No respiratory distress.  Chest:     Chest wall: Tenderness (Reproducible left) present.  Abdominal:     General: There is no distension.     Tenderness: There is no abdominal tenderness. There is no right CVA tenderness or left CVA tenderness.  Musculoskeletal:        General: Normal range of motion.     Cervical back: Normal range of motion. No tenderness.     Right lower leg: No edema.     Left lower leg: No edema.  Skin:    General: Skin is warm.     Capillary Refill: Capillary refill takes less than 2 seconds.  Neurological:  General: No focal deficit present.     Mental Status: She is alert. Mental status is at baseline.  Psychiatric:        Mood and Affect: Mood normal.     IMPRESSION / MDM / ASSESSMENT AND PLAN / ED COURSE  I reviewed the triage vital signs and the nursing notes.  Differential diagnosis including pregnancy, vasovagal episode, orthostatic hypotension, electrolyte abnormality, dehydration, rib fracture, rib contusion, pneumonia   EKG  I, Clotilda Punter, the attending physician, personally viewed and interpreted this ECG.   Rate: Normal  Rhythm: Normal sinus  Axis: Normal  Intervals: Normal  ST&T Change: None Low voltage.  No signs of WPW, no epsilon waves, no signs of HOCM  No tachycardic or bradycardic dysrhythmias while on cardiac telemetry.  RADIOLOGY I independently reviewed imaging, my interpretation of imaging: Chest x-ray with no signs of pneumothorax.  No obvious rib fracture.  Read as no acute findings.  LABS (all labs ordered are listed, but only abnormal results are displayed) Labs interpreted as -    Labs Reviewed  COMPREHENSIVE METABOLIC PANEL WITH GFR  CBC   POC URINE PREG, ED     MDM  Lab work overall reassuring with no significant leukocytosis.  Creatinine at baseline.  No significant electrolyte abnormality.  Pregnancy test is negative.  Lidoderm  patch to left rib with given Tylenol .  No concern for basilar skull fracture.  No sudden onset of headache and not the worst headache of her life, have a low suspicion for subarachnoid hemorrhage or intracranial hemorrhage.  Low suspicion for pulmonary embolism, no pleuritic chest pain and chest pain only after trauma, low risk Wells criteria and PERC negative.  Most likely with lung contusion.  Discussed Lidoderm  patches, NSAIDs and Tylenol .  Discussed return precautions for any signs of pneumonia or worsening symptoms.  No questions or concerns at time of discharge.     PROCEDURES:  Critical Care performed: No  Procedures  Patient's presentation is most consistent with acute presentation with potential threat to life or bodily function.   MEDICATIONS ORDERED IN ED: Medications  lidocaine  (LIDODERM ) 5 % 1 patch (has no administration in time range)  acetaminophen  (TYLENOL ) tablet 1,000 mg (1,000 mg Oral Given 08/25/24 1152)    FINAL CLINICAL IMPRESSION(S) / ED DIAGNOSES   Final diagnoses:  Syncope, unspecified syncope type  Rib contusion, left, initial encounter     Rx / DC Orders   ED Discharge Orders     None        Note:  This document was prepared using Dragon voice recognition software and may include unintentional dictation errors.   Punter Clotilda, MD 08/25/24 1153

## 2024-08-25 NOTE — ED Triage Notes (Signed)
 Patient states she had a syncopal episode yesterday at work; today complaining of pain to left ribs.

## 2024-08-25 NOTE — Telephone Encounter (Signed)
 FYI Only or Action Required?: FYI only for provider: ED advised.  Patient was last seen in primary care on 12/02/2022 by Jill Kelly DASEN, FNP.  Called Nurse Triage reporting Rib Injury and Loss of Consciousness.  Symptoms began yesterday.  Interventions attempted: Rest, hydration, or home remedies.  Symptoms are: rapidly improving.  Triage Disposition: Go to ED Now (overriding Call EMS 911 Now)  Patient/caregiver understands and will follow disposition?: Yes Reason for Disposition  Unconscious or was unconscious  Answer Assessment - Initial Assessment Questions Previously saw FNP Jill at [facility]  Pt had witnesses syncopal episode at work yesterday, fell from standing position onto her left side, also reports striking right side of face with edema. Pt was unconscious x 1-2 minutes. States hx of syncopal episodes, mostly while a teenager and again several months ago. Denies known diagnoses.   Reports 3/10 headache, lightheadedness and vision changes currently.   1. MECHANISM: How did the injury happen?     See above  2. ONSET: When did the injury happen? (.e.g., minutes, hours, days ago)     08/24/24  3. LOCATION: Where on the chest is the injury located?     See above  4. APPEARANCE: What does the injury look like?     Bruising  6. SEVERITY: Any difficulty with breathing?     Rib pain with deep breathing  7. PREGNANCY: Is there any chance you are pregnant? When was your last menstrual period?       Denies  Protocols used: Chest Injury-A-AH Copied from CRM #[id number]. Topic: Clinical - Red Word Triage >> Aug 25, 2024  8:12 AM Zy'onna H wrote: [facility] that prompted transfer to Nurse Triage: Patient experienced a fall yesterday at work and is following up with her PCP to make sure everything is okay.   - Left Side of Ribs are in pain **Transf to NT**  Past Medical History:  Diagnosis Date   Annual physical exam 06/27/2022    Bipolar 1 disorder (HCC)    Depression    Migraine    OCD (obsessive compulsive disorder)    TMJ (dislocation of temporomandibular joint)

## 2024-08-25 NOTE — Discharge Instructions (Signed)
 You were seen in the emergency department after an episode of passing out yesterday with left-sided rib pain.  You had a chest x-ray done that did not show any signs of rib fractures or broken ribs.  You could have a rib fracture that is not showing on chest x-ray or a rib contusion.  Your EKG was normal.  Your lab work was overall normal.  Stay hydrated and drink plenty of fluids.  Follow-up closely with your primary care physician.  Return to the emergency department for any recurrent episodes of passing out or any new or worsening symptoms.  You can use over-the-counter Lidoderm  patch, place to your chest wall where it is hurting and leave on for 12 hours.  You can alternate Motrin  and Tylenol  for pain control.  Pain control:  Ibuprofen  (motrin /aleve/advil ) - You can take 3 tablets (600 mg) every 6 hours as needed for pain/fever.  Acetaminophen  (tylenol ) - You can take 2 extra strength tablets (1000 mg) every 6 hours as needed for pain/fever.  You can alternate these medications or take them together.  Make sure you eat food/drink water when taking these medications.

## 2024-08-25 NOTE — Telephone Encounter (Signed)
 noted

## 2024-09-08 ENCOUNTER — Other Ambulatory Visit: Payer: Self-pay | Admitting: Adult Health

## 2024-09-08 DIAGNOSIS — F319 Bipolar disorder, unspecified: Secondary | ICD-10-CM

## 2024-09-10 ENCOUNTER — Encounter: Payer: Self-pay | Admitting: Adult Health

## 2024-09-10 ENCOUNTER — Telehealth: Admitting: Adult Health

## 2024-09-10 DIAGNOSIS — F411 Generalized anxiety disorder: Secondary | ICD-10-CM

## 2024-09-10 DIAGNOSIS — G4700 Insomnia, unspecified: Secondary | ICD-10-CM

## 2024-09-10 DIAGNOSIS — F429 Obsessive-compulsive disorder, unspecified: Secondary | ICD-10-CM | POA: Diagnosis not present

## 2024-09-10 DIAGNOSIS — F422 Mixed obsessional thoughts and acts: Secondary | ICD-10-CM

## 2024-09-10 DIAGNOSIS — F319 Bipolar disorder, unspecified: Secondary | ICD-10-CM | POA: Diagnosis not present

## 2024-09-10 MED ORDER — FLUVOXAMINE MALEATE 100 MG PO TABS: ORAL_TABLET | ORAL | 1 refills | Status: AC

## 2024-09-10 MED ORDER — BUSPIRONE HCL 10 MG PO TABS: 10.0000 mg | ORAL_TABLET | Freq: Two times a day (BID) | ORAL | 1 refills | Status: AC

## 2024-09-10 MED ORDER — CARIPRAZINE HCL 3 MG PO CAPS: 3.0000 mg | ORAL_CAPSULE | Freq: Every day | ORAL | 1 refills | Status: AC

## 2024-09-10 MED ORDER — LAMOTRIGINE 100 MG PO TABS: 100.0000 mg | ORAL_TABLET | Freq: Two times a day (BID) | ORAL | 1 refills | Status: AC

## 2024-09-10 NOTE — Progress Notes (Signed)
 Jill [name] [id number] Mar 08, 1986 38 y.o.  Virtual Visit via Telephone Note  I connected with pt on 09/10/24 at  1:00 PM EST by telephone and verified that I am speaking with the correct person using two identifiers.   I discussed the limitations, risks, security and privacy concerns of performing an evaluation and management service by telephone and the availability of in person appointments. I also discussed with the patient that there may be a patient responsible charge related to this service. The patient expressed understanding and agreed to proceed.   I discussed the assessment and treatment plan with the patient. The patient was provided an opportunity to ask questions and all were answered. The patient agreed with the plan and demonstrated an understanding of the instructions.   The patient was advised to call back or seek an in-person evaluation if the symptoms worsen or if the condition fails to improve as anticipated.  I provided 15 minutes of non-face-to-face time during this encounter.  The patient was located at home.  The provider was located at [facility].   Angeline LOISE Sayers, NP   Subjective:   Patient ID:  Jill [name] is a 38 y.o. (DOB 10-Aug-1986) female.  Chief Complaint: No chief complaint on file.   HPI Jill [name] presents for follow-up of GAD, BPD1, insomnia, and OCD.  Describes mood today as ok. Pleasant. Flat. Denies tearfulness. Mood symptoms - reports anxiety - some of the time. Reports decreased depression and irritability. Reports stable interest and motivation. Denies recent panic attacks. Reports some worry and rumination. Reports some over thinking - mostly about work. Denies manic symptoms. Reports mood as stable. Stating I feel like I'm doing ok. Feels like current medication regimen works well. Taking medications as prescribed.  Enery levels stable. Active, walking three times a week.  Enjoys some usual  interests and activities. Married. Lives with husband and son. Family local. Sister lives in [place]. Appetite adequate. Weight loss 80 pounds - 205 pounds. Sleeping well most nights. Averages 6 to 8 hours. Focus and concentration good. Completing tasks. Managing some aspects of household. Working 8 hours a day - [employer]. Denies SI or HI.  Denies AH or VH. Denies paranoia. Denies self harm. Denies substance use.  Hospitalized in 2016 for paranoia, substance use, reckless behaviors, and overspending and was started on Geodon but had to stop due to costs.   Previous medication trials: Lamictal , Zoloft, Luvox , Geodon  Review of Systems:  Review of Systems  Musculoskeletal:  Negative for gait problem.  Neurological:  Negative for tremors.  Psychiatric/Behavioral:         Please refer to HPI    Medications: I have reviewed the patient's current medications.  Current Outpatient Medications  Medication Sig Dispense Refill   busPIRone  (BUSPAR ) 10 MG tablet Take 1 tablet (10 mg total) by mouth 2 (two) times daily. 180 tablet 1   cariprazine  (VRAYLAR ) 3 MG capsule Take 1 capsule (3 mg total) by mouth daily. 90 capsule 1   fluvoxaMINE  (LUVOX ) 100 MG tablet Take one tablet daily. 90 tablet 1   lamoTRIgine  (LAMICTAL ) 100 MG tablet Take 1 tablet (100 mg total) by mouth 2 (two) times daily. 180 tablet 1   ZEPBOUND  7.5 MG/0.5ML Pen SMARTSIG:7.5 Milligram(s) SUB-Q Once a Week     No current facility-administered medications for this visit.    Medication Side Effects: None  Allergies:  Allergies  Allergen Reactions   Amoxicillin Anaphylaxis, Shortness Of Breath and Rash  Childhood reaction  Has patient had a PCN reaction causing immediate rash, facial/tongue/throat swelling, SOB or lightheadedness with hypotension: Yes Has patient had a PCN reaction causing severe rash involving mucus membranes or skin necrosis: Unknown Has patient had a PCN reaction that required hospitalization:  No Has patient had a PCN reaction occurring within the last 10 years: No If all of the above answers are NO, then may proceed with Cephalosporin use.   Penicillins     Pt does not think that she is allergic to this medication but is not sure     Past Medical History:  Diagnosis Date   Annual physical exam 06/27/2022   Bipolar 1 disorder (HCC)    Depression    Migraine    OCD (obsessive compulsive disorder)    TMJ (dislocation of temporomandibular joint)     Family History  Problem Relation Age of Onset   Depression Mother    Alcohol abuse Father    Lung cancer Maternal Grandmother    Hypertension Maternal Grandmother    Hypertension Maternal Grandfather    Stroke Maternal Grandfather    Hypertension Paternal Grandmother    Headache Paternal Grandmother    Migraines Paternal Grandmother    Hypertension Paternal Grandfather    Breast cancer Neg Hx     Social History   Socioeconomic History   Marital status: Married    Spouse name: Bernardino   Number of children: Not on file   Years of education: Not on file   Highest education level: Not on file  Occupational History   Occupation: Lab assisstant  Tobacco Use   Smoking status: Never   Smokeless tobacco: Never  Vaping Use   Vaping status: Never Used  Substance and Sexual Activity   Alcohol use: No    Alcohol/week: 0.0 standard drinks of alcohol   Drug use: No   Sexual activity: Yes    Partners: Male    Birth control/protection: None, Condom  Other Topics Concern   Not on file  Social History Narrative   She is originally from [place] and graduated high school in [place]. She has been married for approximately 2years and has no children. She has been working for [employer] for the past one month. She currently lives with her husband. She denies any physical or sexual abuse.   Social Drivers of Corporate Investment Banker Strain: Not on file  Food Insecurity: Not on file  Transportation Needs: Not on file   Physical Activity: Not on file  Stress: Not on file  Social Connections: Not on file  Intimate Partner Violence: Not on file    Past Medical History, Surgical history, Social history, and Family history were reviewed and updated as appropriate.   Please see review of systems for further details on the patient's review from today.   Objective:   Physical Exam:  LMP 08/10/2024 (Exact Date)   Physical Exam Constitutional:      General: She is not in acute distress. Musculoskeletal:        General: No deformity.  Neurological:     Mental Status: She is alert and oriented to person, place, and time.     Coordination: Coordination normal.  Psychiatric:        Attention and Perception: Attention and perception normal. She does not perceive auditory or visual hallucinations.        Mood and Affect: Mood normal. Mood is not anxious or depressed. Affect is not labile, blunt, angry or inappropriate.  Speech: Speech normal.        Behavior: Behavior normal.        Thought Content: Thought content normal. Thought content is not paranoid or delusional. Thought content does not include homicidal or suicidal ideation. Thought content does not include homicidal or suicidal plan.        Cognition and Memory: Cognition and memory normal.        Judgment: Judgment normal.     Comments: Insight intact     Lab Review:     Component Value Date/Time   NA 137 08/25/2024 1022   NA 139 03/07/2022 1441   NA 139 01/31/2015 1329   K 4.1 08/25/2024 1022   K 3.7 01/31/2015 1329   CL 103 08/25/2024 1022   CL 107 01/31/2015 1329   CO2 24 08/25/2024 1022   CO2 26 01/31/2015 1329   GLUCOSE 84 08/25/2024 1022   GLUCOSE 86 01/31/2015 1329   BUN 9 08/25/2024 1022   BUN 11 03/07/2022 1441   BUN 9 01/31/2015 1329   CREATININE 0.75 08/25/2024 1022   CREATININE 0.63 01/31/2015 1329   CALCIUM  9.0 08/25/2024 1022   CALCIUM  8.8 (L) 01/31/2015 1329   PROT 7.7 08/25/2024 1022   PROT 7.4 03/07/2022  1441   PROT 7.6 01/31/2015 1329   ALBUMIN 4.3 08/25/2024 1022   ALBUMIN 4.3 03/07/2022 1441   ALBUMIN 3.8 01/31/2015 1329   AST 19 08/25/2024 1022   AST 24 01/31/2015 1329   ALT 9 08/25/2024 1022   ALT 19 01/31/2015 1329   ALKPHOS 64 08/25/2024 1022   ALKPHOS 73 01/31/2015 1329   BILITOT 0.5 08/25/2024 1022   BILITOT 0.4 03/07/2022 1441   BILITOT 0.7 01/31/2015 1329   GFRNONAA >60 08/25/2024 1022   GFRNONAA >60 01/31/2015 1329   GFRAA 111 04/27/2020 1019   GFRAA >60 01/31/2015 1329       Component Value Date/Time   WBC 8.8 08/25/2024 1022   RBC 5.02 08/25/2024 1022   HGB 13.9 08/25/2024 1022   HGB 14.2 03/07/2022 1441   HCT 42.0 08/25/2024 1022   HCT 41.3 03/07/2022 1441   PLT 370 08/25/2024 1022   PLT 434 03/07/2022 1441   MCV 83.7 08/25/2024 1022   MCV 80 03/07/2022 1441   MCV 85 01/31/2015 1329   MCH 27.7 08/25/2024 1022   MCHC 33.1 08/25/2024 1022   RDW 13.1 08/25/2024 1022   RDW 12.7 03/07/2022 1441   RDW 13.4 01/31/2015 1329   LYMPHSABS 2.9 03/07/2022 1441   MONOABS 0.7 02/24/2018 0405   EOSABS 0.1 03/07/2022 1441   BASOSABS 0.1 03/07/2022 1441    No results found for: POCLITH, LITHIUM   No results found for: PHENYTOIN, PHENOBARB, VALPROATE, CBMZ   .res Assessment: Plan:    Plan:  PDMP reviewed  Lamictal  100mg  BID Vraylar  3mg   Buspar  10mg  BID   Luvox  100mg  daily  RTC 3 months  15 minutes spent dedicated to the care of this patient on the date of this encounter to include pre-visit review of records, ordering of medication, post visit documentation, and face-to-face time with the patient discussing GAD, BPD1, insomnia and OCD. Discussed continuing current medication regimen.  Approved for intermittent FMLA - renews in January.  Patient advised to contact office with any questions, adverse effects, or acute worsening in signs and symptoms.  Counseled patient regarding potential benefits, risks, and side effects of Lamictal  to include  potential risk of Stevens-Johnson syndrome. Advised patient to stop taking Lamictal  and contact office immediately  if rash develops and to seek urgent medical attention if rash is severe and/or spreading quickly.  Discussed potential metabolic side effects associated with atypical antipsychotics, as well as potential risk for movement side effects. Advised pt to contact office if movement side effects occur.   There are no diagnoses linked to this encounter.   Please see After Visit Summary for patient specific instructions.  Future Appointments  Date Time Provider Department Center  09/10/2024  1:00 PM Aaronmichael Brumbaugh Nattalie, NP [facility] None  10/25/2024  8:40 AM Ostwalt, Jolynn, PA-C [facility]    No orders of the defined types were placed in this encounter.     -------------------------------

## 2024-10-25 ENCOUNTER — Encounter: Admitting: Physician Assistant

## 2024-10-27 ENCOUNTER — Ambulatory Visit: Admitting: Physician Assistant

## 2024-10-27 VITALS — BP 119/81 | HR 92 | Ht 69.0 in | Wt 203.2 lb

## 2024-10-27 DIAGNOSIS — F332 Major depressive disorder, recurrent severe without psychotic features: Secondary | ICD-10-CM | POA: Diagnosis not present

## 2024-10-27 DIAGNOSIS — F422 Mixed obsessional thoughts and acts: Secondary | ICD-10-CM

## 2024-10-27 DIAGNOSIS — E66812 Obesity, class 2: Secondary | ICD-10-CM

## 2024-10-27 DIAGNOSIS — R5383 Other fatigue: Secondary | ICD-10-CM

## 2024-10-27 DIAGNOSIS — Z0184 Encounter for antibody response examination: Secondary | ICD-10-CM

## 2024-10-27 DIAGNOSIS — K2100 Gastro-esophageal reflux disease with esophagitis, without bleeding: Secondary | ICD-10-CM

## 2024-10-27 DIAGNOSIS — E66811 Obesity, class 1: Secondary | ICD-10-CM

## 2024-10-27 DIAGNOSIS — K5900 Constipation, unspecified: Secondary | ICD-10-CM | POA: Diagnosis not present

## 2024-10-27 DIAGNOSIS — E559 Vitamin D deficiency, unspecified: Secondary | ICD-10-CM | POA: Diagnosis not present

## 2024-10-27 DIAGNOSIS — Z6839 Body mass index (BMI) 39.0-39.9, adult: Secondary | ICD-10-CM

## 2024-10-27 DIAGNOSIS — G43829 Menstrual migraine, not intractable, without status migrainosus: Secondary | ICD-10-CM | POA: Diagnosis not present

## 2024-10-27 DIAGNOSIS — Z1159 Encounter for screening for other viral diseases: Secondary | ICD-10-CM

## 2024-10-27 DIAGNOSIS — Z789 Other specified health status: Secondary | ICD-10-CM

## 2024-10-27 MED ORDER — NURTEC 75 MG PO TBDP: 75.0000 mg | ORAL_TABLET | ORAL | 0 refills | Status: AC | PRN

## 2024-10-27 NOTE — Progress Notes (Signed)
 " Established patient visit  Patient: Jill [name]   DOB: 08/31/1986   39 y.o. Female  MRN: [id number] Visit Date: 10/27/2024  Today's healthcare provider: Jolynn Spencer, PA-C   Chief Complaint  Patient presents with   Transitions Of Care   Medical Management of Chronic Issues   Subjective       Discussed the use of AI scribe software for clinical note transcription with the patient, who gave verbal consent to proceed.  History of Present Illness Jill [name] is a 39 year old female who presents for transition of care and medication management.  She has GERD treated with omeprazole  40 mg daily and follows lifestyle measures, including avoiding NSAIDs, elevating the head of the bed, and not eating before bedtime.  She has lost about 90 pounds over the past year while on Zepbound  12.5 mg for weight management. Her constipation and stomach symptoms have improved with weight loss.  She has migraines about twice per month. She previously used Nurtec and Ajovy  but stopped. She now uses over-the-counter Excedrin for acute relief.  She has bipolar disorder and OCD and takes Buspar , Vraylar , Lovaza, and Lamictal . She follows with a psychiatrist but missed her last visit.  She feels lightheaded and dizzy and had a syncopal episode at work a couple of months ago. She drinks water regularly but remains thirsty. She denies chest pain, shortness of breath, or palpitations.  She lives with her husband and son and works in a crowded environment.       10/27/2024    8:38 AM 12/02/2022    8:34 AM 10/31/2022    4:07 PM  Depression screen PHQ 2/9  Decreased Interest 0 0 0  Down, Depressed, Hopeless 0 1 1  PHQ - 2 Score 0 1 1  Altered sleeping 1 0 1  Tired, decreased energy 1 0 1  Change in appetite 0 0 0  Feeling bad or failure about yourself  1 0 0  Trouble concentrating 1 0 0  Moving slowly or fidgety/restless 0 0 0  Suicidal thoughts 0 0 0  PHQ-9 Score 4 1  3    Difficult  doing work/chores Somewhat difficult Not difficult at all Not difficult at all     Data saved with a previous flowsheet row definition      10/27/2024    8:38 AM 02/21/2020   10:37 AM  GAD 7 : Generalized Anxiety Score  Nervous, Anxious, on Edge 0 2   Control/stop worrying 1 1   Worry too much - different things 1 1   Trouble relaxing 0 1   Restless 0 0   Easily annoyed or irritable 0 1   Afraid - awful might happen 0 1   Total GAD 7 Score 2 7  Anxiety Difficulty Not difficult at all Somewhat difficult     Data saved with a previous flowsheet row definition    Medications: Show/hide medication list[1]  Review of Systems All negative Except see HPI       Objective    BP 119/81 (BP Location: Left Arm, Patient Position: Sitting, Cuff Size: Normal)   Pulse 92   Ht 5' 9 (1.753 m)   Wt 203 lb 3.2 oz (92.2 kg)   LMP 10/07/2024 (Approximate)   SpO2 100%   BMI 30.01 kg/m     Physical Exam Vitals reviewed.  Constitutional:      General: She is not in acute distress.    Appearance: Normal appearance. She is well-developed. She is  not diaphoretic.  HENT:     Head: Normocephalic and atraumatic.  Eyes:     General: No scleral icterus.    Conjunctiva/sclera: Conjunctivae normal.  Neck:     Thyroid: No thyromegaly.  Cardiovascular:     Rate and Rhythm: Normal rate and regular rhythm.     Pulses: Normal pulses.     Heart sounds: Normal heart sounds. No murmur heard. Pulmonary:     Effort: Pulmonary effort is normal. No respiratory distress.     Breath sounds: Normal breath sounds. No wheezing, rhonchi or rales.  Musculoskeletal:     Cervical back: Neck supple.     Right lower leg: No edema.     Left lower leg: No edema.  Lymphadenopathy:     Cervical: No cervical adenopathy.  Skin:    General: Skin is warm and dry.     Findings: No rash.  Neurological:     Mental Status: She is alert and oriented to person, place, and time. Mental status is at baseline.   Psychiatric:        Mood and Affect: Mood normal.        Behavior: Behavior normal.      No results found for any visits on 10/27/24.      Assessment and Plan Assessment & Plan Gastroesophageal reflux disease with esophagitis Chronic and stable  GERD well-managed with omeprazole  and lifestyle modifications. - Continue omeprazole  40 mg daily. - Maintain lifestyle modifications. Will follow-up  Constipation Improved significantly, likely due to weight loss and lifestyle changes. Will follow-up  Class 2 severe obesity due to excess calories Associated with gerd, migraines, bipolar and OCD significant weight loss of 90 pounds achieved with Zepbound  and [facility] through [employer]. - Continue Zepbound  12.5 mg as prescribed by [facility]. Workup ordered see below Will follow-up  Menstrual migraine Chronic  migraines occur biweekly, previously managed with Nurtec, currently using Excedrin. Nurtec effective for acute management. - Prescribed Nurtec 75 mcg for acute management. - Provided 30 tablets of Nurtec for trial. - Advised to check insurance coverage with pharmacy and adjust prescription if necessary. Will follow-up  Major depressive disorder, recurrent, severe without psychotic features Obsessive-compulsive disorder Bipolar 1 disorder Chronic and stable On Buspar , Vraylar , Lovaza, and Lamictal . Reports fatigue, possibly related to medication or weight loss. - Ordered CBC to check hemoglobin levels. - Advised to discuss symptoms with psychiatrist for potential medication adjustment. Will follow-up   Vitamin D  deficiency Vitamin D  levels to be checked due to potential link with depression. - Ordered vitamin D  level test. Will follow-up  Other fatigue Reports fatigue, possibly related to medication or weight loss. - Ordered CBC to check hemoglobin levels. - Advised to discuss symptoms with psychiatrist for potential medication  adjustment. Will follow-up  Immunity to hepatitis B virus Immunity status to be checked. - Ordered hepatitis B immunity test.  Screening for hepatitis C Screening recommended due to potential asymptomatic infection. - Ordered hepatitis C antibody test.   Vitamin D  deficiency  - Vitamin D  (25 hydroxy)  Class 2 severe obesity due to excess calories with serious comorbidity and body mass index (BMI) of 39.0 to 39.9 in adult  - TSH + free T4 - CBC with Differential/Platelet    Menstrual migraine without status migrainosus, not intractable  - Rimegepant Sulfate (NURTEC) 75 MG TBDP; Take 1 tablet (75 mg total) by mouth as needed.  Dispense: 30 tablet; Refill: 0  Morbid obesity (HCC)  - TSH + free T4 -  Rimegepant Sulfate (NURTEC) 75 MG TBDP; Take 1 tablet (75 mg total) by mouth as needed.  Dispense: 30 tablet; Refill: 0 - CBC with Differential/Platelet - Comprehensive metabolic panel with GFR  Major depressive disorder, recurrent, severe without psychotic features (HCC)  - Comprehensive metabolic panel with GFR  Other fatigue  - Hepatitis B surface antibody,quantitative - Hepatitis C antibody - Vitamin D  (25 hydroxy) - Lipid panel - TSH + free T4 - CBC with Differential/Platelet - Comprehensive metabolic panel with GFR  Transition of care From elise Payne No orders of the defined types were placed in this encounter.   No follow-ups on file.   The patient was advised to call back or seek an in-person evaluation if the symptoms worsen or if the condition fails to improve as anticipated.  I discussed the assessment and treatment plan with the patient. The patient was provided an opportunity to ask questions and all were answered. The patient agreed with the plan and demonstrated an understanding of the instructions.  I, Jong Rickman, PA-C have reviewed all documentation for this visit. The documentation on 10/27/2024  for the exam, diagnosis, procedures, and orders are  all accurate and complete.  Jolynn Spencer, [facility], M[facility] [phone] (phone) [phone] (fax)  [facility]     [1]  Outpatient Medications Prior to Visit  Medication Sig   busPIRone  (BUSPAR ) 10 MG tablet Take 1 tablet (10 mg total) by mouth 2 (two) times daily.   cariprazine  (VRAYLAR ) 3 MG capsule Take 1 capsule (3 mg total) by mouth daily.   fluvoxaMINE  (LUVOX ) 100 MG tablet Take one tablet daily.   lamoTRIgine  (LAMICTAL ) 100 MG tablet Take 1 tablet (100 mg total) by mouth 2 (two) times daily.   tirzepatide  (ZEPBOUND ) 12.5 MG/0.5ML Pen Inject 12.5 mg into the skin once a week.   ZEPBOUND  7.5 MG/0.5ML Pen SMARTSIG:7.5 Milligram(s) SUB-Q Once a Week   No facility-administered medications prior to visit.   "

## 2024-10-28 ENCOUNTER — Ambulatory Visit: Payer: Self-pay | Admitting: Physician Assistant

## 2024-10-28 DIAGNOSIS — E559 Vitamin D deficiency, unspecified: Secondary | ICD-10-CM

## 2024-10-28 LAB — CBC WITH DIFFERENTIAL/PLATELET
Basophils Absolute: 0.1 x10E3/uL (ref 0.0–0.2)
Basos: 1 %
EOS (ABSOLUTE): 0 x10E3/uL (ref 0.0–0.4)
Eos: 0 %
Hematocrit: 42.6 % (ref 34.0–46.6)
Hemoglobin: 14.2 g/dL (ref 11.1–15.9)
Immature Grans (Abs): 0 x10E3/uL (ref 0.0–0.1)
Immature Granulocytes: 0 %
Lymphocytes Absolute: 3.1 x10E3/uL (ref 0.7–3.1)
Lymphs: 29 %
MCH: 28.2 pg (ref 26.6–33.0)
MCHC: 33.3 g/dL (ref 31.5–35.7)
MCV: 85 fL (ref 79–97)
Monocytes Absolute: 0.8 x10E3/uL (ref 0.1–0.9)
Monocytes: 7 %
Neutrophils Absolute: 6.8 x10E3/uL (ref 1.4–7.0)
Neutrophils: 63 %
Platelets: 456 x10E3/uL — ABNORMAL HIGH (ref 150–450)
RBC: 5.04 x10E6/uL (ref 3.77–5.28)
RDW: 13.1 % (ref 11.7–15.4)
WBC: 10.8 x10E3/uL (ref 3.4–10.8)

## 2024-10-28 LAB — TSH+FREE T4
Free T4: 1.15 ng/dL (ref 0.82–1.77)
TSH: 1.82 u[IU]/mL (ref 0.450–4.500)

## 2024-10-28 LAB — LIPID PANEL
Chol/HDL Ratio: 2.8 ratio (ref 0.0–4.4)
Cholesterol, Total: 143 mg/dL (ref 100–199)
HDL: 51 mg/dL
LDL Chol Calc (NIH): 74 mg/dL (ref 0–99)
Triglycerides: 97 mg/dL (ref 0–149)
VLDL Cholesterol Cal: 18 mg/dL (ref 5–40)

## 2024-10-28 LAB — COMPREHENSIVE METABOLIC PANEL WITH GFR
ALT: 10 IU/L (ref 0–32)
AST: 16 IU/L (ref 0–40)
Albumin: 4.7 g/dL (ref 3.9–4.9)
Alkaline Phosphatase: 85 IU/L (ref 41–116)
BUN/Creatinine Ratio: 15 (ref 9–23)
BUN: 11 mg/dL (ref 6–20)
Bilirubin Total: 0.5 mg/dL (ref 0.0–1.2)
CO2: 22 mmol/L (ref 20–29)
Calcium: 9.8 mg/dL (ref 8.7–10.2)
Chloride: 100 mmol/L (ref 96–106)
Creatinine, Ser: 0.75 mg/dL (ref 0.57–1.00)
Globulin, Total: 3.1 g/dL (ref 1.5–4.5)
Glucose: 81 mg/dL (ref 70–99)
Potassium: 4.8 mmol/L (ref 3.5–5.2)
Sodium: 138 mmol/L (ref 134–144)
Total Protein: 7.8 g/dL (ref 6.0–8.5)
eGFR: 104 mL/min/1.73

## 2024-10-28 LAB — HEPATITIS C ANTIBODY: Hep C Virus Ab: NONREACTIVE

## 2024-10-28 LAB — VITAMIN D 25 HYDROXY (VIT D DEFICIENCY, FRACTURES): Vit D, 25-Hydroxy: 16 ng/mL — ABNORMAL LOW (ref 30.0–100.0)

## 2024-10-28 LAB — HEPATITIS B SURFACE ANTIBODY, QUANTITATIVE: Hepatitis B Surf Ab Quant: 142 m[IU]/mL

## 2024-10-28 MED ORDER — VITAMIN D (ERGOCALCIFEROL) 1.25 MG (50000 UNIT) PO CAPS: 50000.0000 [IU] | ORAL_CAPSULE | ORAL | 0 refills | Status: AC

## 2024-11-25 ENCOUNTER — Encounter: Admitting: Physician Assistant

## 2024-12-09 ENCOUNTER — Telehealth: Admitting: Adult Health
